# Patient Record
Sex: Male | Born: 1965 | Race: White | Hispanic: No | Marital: Single | Smoking: Former smoker
Health system: Southern US, Community
[De-identification: ages and names within clinical notes are randomized; demographics above are authoritative.]

## PROBLEM LIST (undated history)

## (undated) DIAGNOSIS — I10 Essential (primary) hypertension: Secondary | ICD-10-CM

## (undated) DIAGNOSIS — I2510 Atherosclerotic heart disease of native coronary artery without angina pectoris: Secondary | ICD-10-CM

## (undated) DIAGNOSIS — I2781 Cor pulmonale (chronic): Secondary | ICD-10-CM

## (undated) DIAGNOSIS — I272 Pulmonary hypertension, unspecified: Secondary | ICD-10-CM

## (undated) DIAGNOSIS — E662 Morbid (severe) obesity with alveolar hypoventilation: Secondary | ICD-10-CM

## (undated) DIAGNOSIS — E119 Type 2 diabetes mellitus without complications: Secondary | ICD-10-CM

## (undated) DIAGNOSIS — G4730 Sleep apnea, unspecified: Secondary | ICD-10-CM

## (undated) DIAGNOSIS — J189 Pneumonia, unspecified organism: Secondary | ICD-10-CM

## (undated) HISTORY — DX: Type 2 diabetes mellitus without complications: E11.9

## (undated) HISTORY — DX: Morbid (severe) obesity with alveolar hypoventilation: E66.2

## (undated) HISTORY — DX: Atherosclerotic heart disease of native coronary artery without angina pectoris: I25.10

## (undated) HISTORY — DX: Cor pulmonale (chronic): I27.81

## (undated) HISTORY — DX: Pneumonia, unspecified organism: J18.9

## (undated) HISTORY — DX: Pulmonary hypertension, unspecified: I27.20

## (undated) HISTORY — DX: Essential (primary) hypertension: I10

---

## 2004-09-13 ENCOUNTER — Inpatient Hospital Stay (HOSPITAL_COMMUNITY): Admission: AC | Admit: 2004-09-13 | Discharge: 2004-09-23 | Payer: Self-pay | Admitting: Cardiology

## 2004-09-13 ENCOUNTER — Ambulatory Visit: Payer: Self-pay | Admitting: Cardiology

## 2004-09-20 HISTORY — PX: CORONARY ARTERY BYPASS GRAFT: SHX141

## 2004-10-04 ENCOUNTER — Encounter: Admission: RE | Admit: 2004-10-04 | Discharge: 2005-01-02 | Payer: Self-pay | Admitting: Cardiology

## 2004-10-25 ENCOUNTER — Ambulatory Visit: Payer: Self-pay | Admitting: Cardiology

## 2005-01-06 ENCOUNTER — Ambulatory Visit: Payer: Self-pay | Admitting: Cardiology

## 2005-02-27 ENCOUNTER — Ambulatory Visit: Payer: Self-pay | Admitting: Cardiology

## 2005-02-27 ENCOUNTER — Ambulatory Visit: Payer: Self-pay

## 2005-06-16 ENCOUNTER — Ambulatory Visit: Payer: Self-pay | Admitting: Cardiology

## 2006-01-18 IMAGING — CR DG CHEST 2V
2 series · 2 of 2 positions shown · non-contrast
Comparison: none

CLINICAL DATA: 38-year-old with chest pain; hx of bypass surgery
 TWO VIEW CHEST: 
 Two views of the chest compared to prior portable film from yesterday.  The left sided chest tube, Swan Ganz catheter and mediastinal drain tubes have been removed.  No pneumothorax is seen.  There is perihilar and basilar atelectasis.  No effusions.

[view not recorded (1 of 2)]
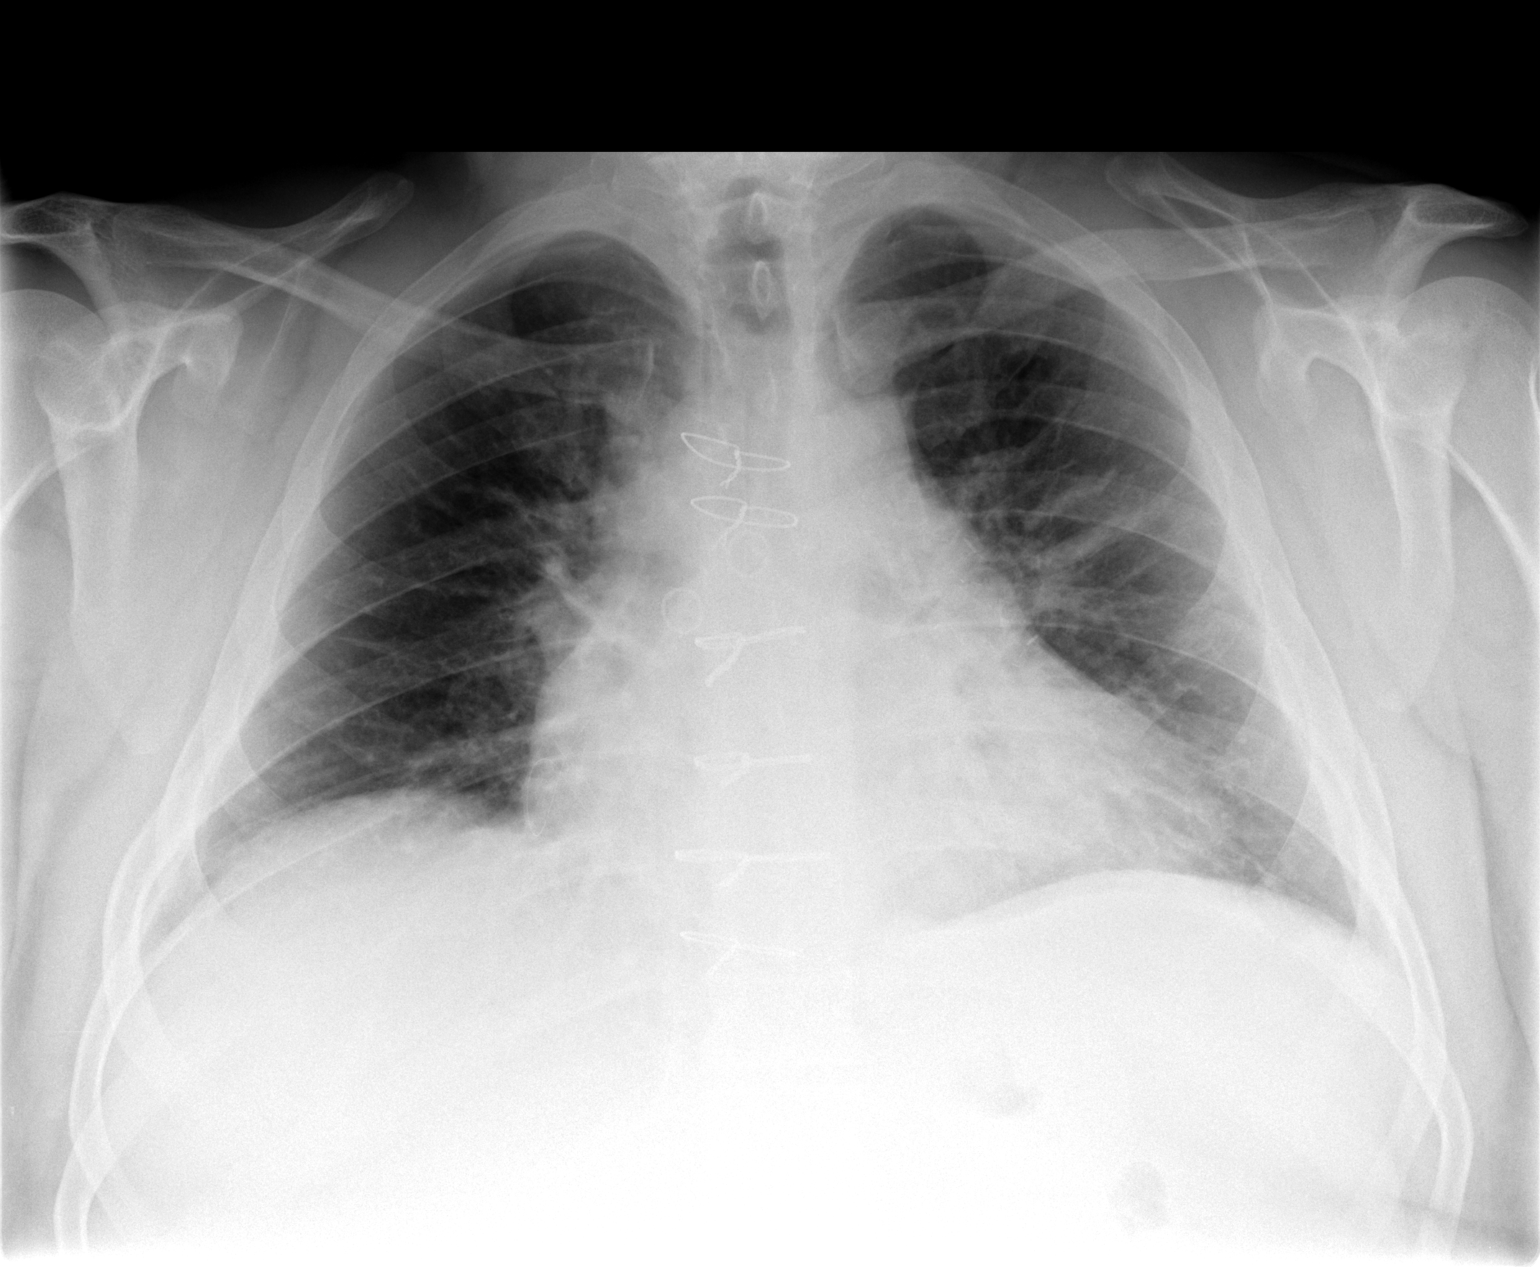

[view not recorded (2 of 2)]
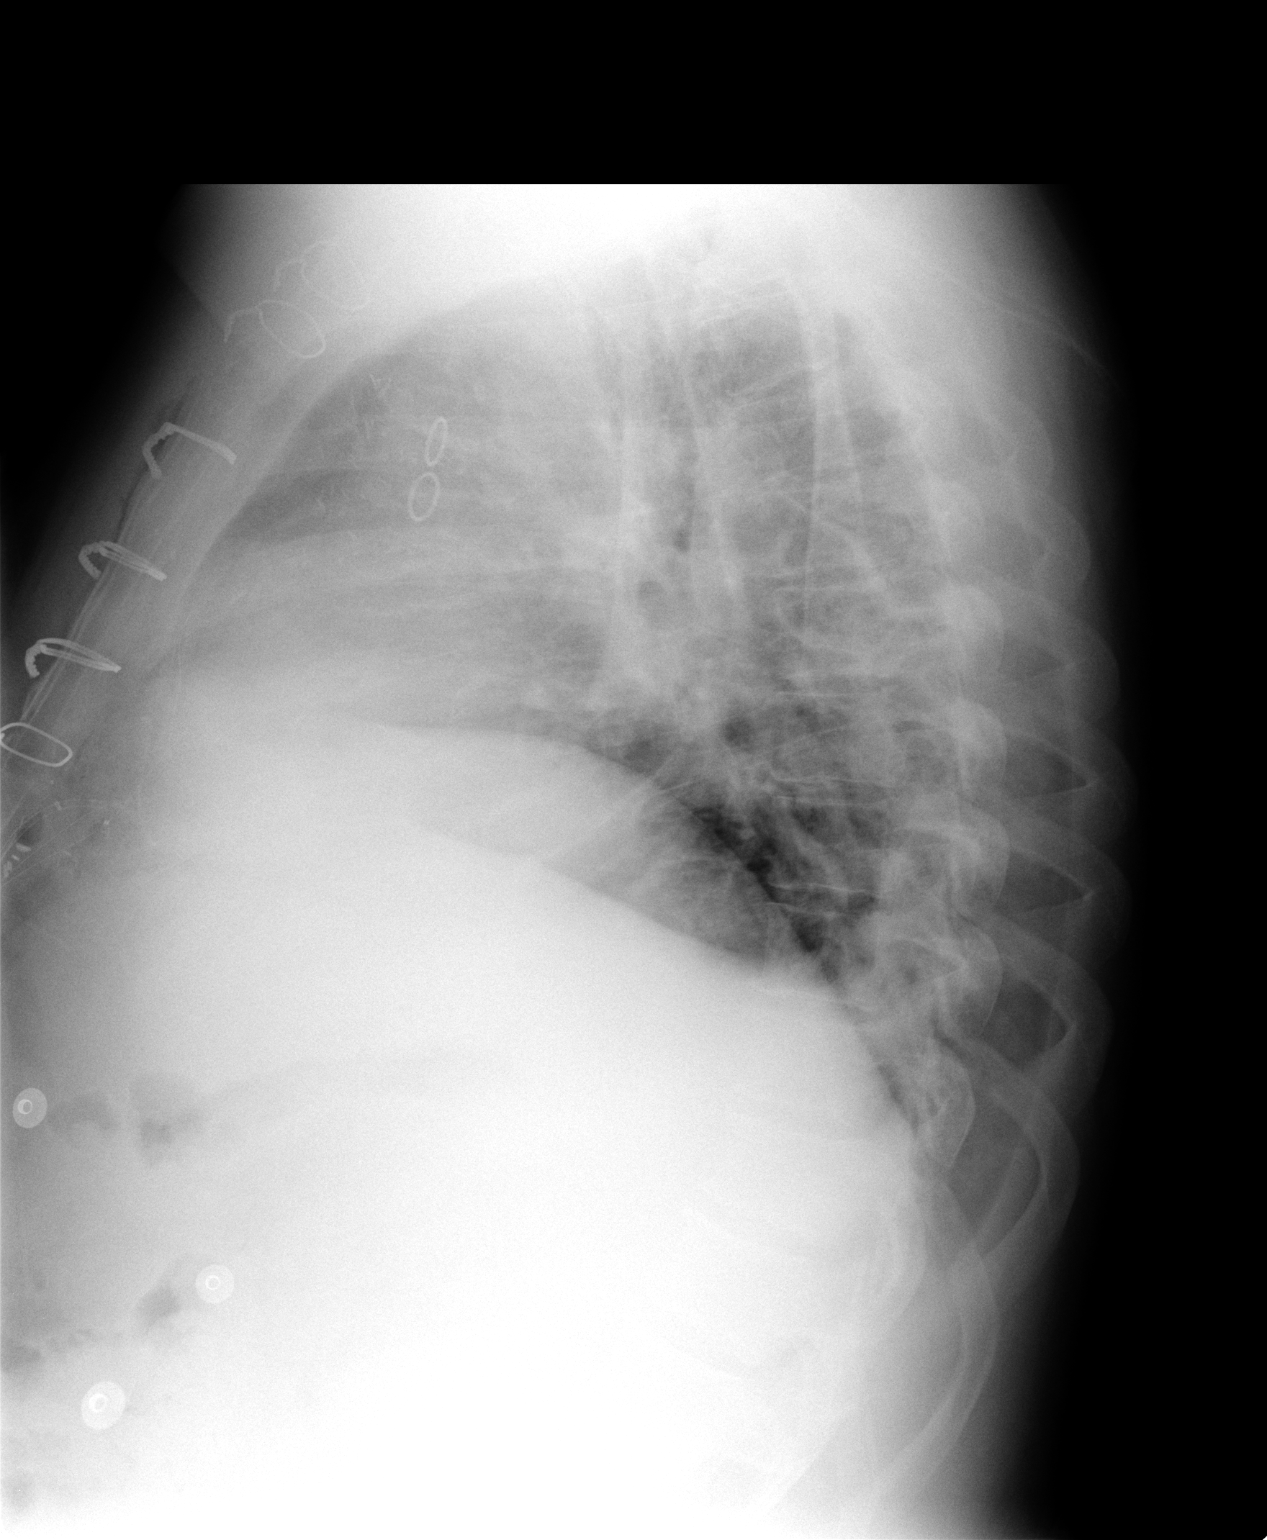

[2 of 2 positions shown; findings below may reference images not displayed]

IMPRESSION: 1.  Removal of the support apparatus. 
 2.  Low lung volumes with perihilar and basilar atelectasis.  No pneumothorax or pulmonary edema.

## 2006-06-18 ENCOUNTER — Ambulatory Visit: Payer: Self-pay | Admitting: Cardiology

## 2007-06-21 ENCOUNTER — Ambulatory Visit: Payer: Self-pay | Admitting: Cardiology

## 2008-06-25 ENCOUNTER — Ambulatory Visit: Payer: Self-pay | Admitting: Cardiology

## 2009-05-19 ENCOUNTER — Encounter: Payer: Self-pay | Admitting: Cardiology

## 2009-06-23 ENCOUNTER — Ambulatory Visit: Payer: Self-pay | Admitting: Cardiology

## 2009-06-23 DIAGNOSIS — I25810 Atherosclerosis of coronary artery bypass graft(s) without angina pectoris: Secondary | ICD-10-CM

## 2009-06-23 DIAGNOSIS — E782 Mixed hyperlipidemia: Secondary | ICD-10-CM | POA: Insufficient documentation

## 2009-06-23 DIAGNOSIS — I10 Essential (primary) hypertension: Secondary | ICD-10-CM | POA: Insufficient documentation

## 2009-06-23 DIAGNOSIS — E119 Type 2 diabetes mellitus without complications: Secondary | ICD-10-CM

## 2009-11-01 ENCOUNTER — Ambulatory Visit: Payer: Self-pay

## 2009-11-01 ENCOUNTER — Ambulatory Visit: Payer: Self-pay | Admitting: Cardiology

## 2009-11-01 ENCOUNTER — Encounter (INDEPENDENT_AMBULATORY_CARE_PROVIDER_SITE_OTHER): Payer: Self-pay | Admitting: *Deleted

## 2009-11-01 ENCOUNTER — Ambulatory Visit (HOSPITAL_COMMUNITY): Admission: RE | Admit: 2009-11-01 | Discharge: 2009-11-01 | Payer: Self-pay | Admitting: Cardiology

## 2009-11-01 ENCOUNTER — Ambulatory Visit: Payer: Self-pay | Admitting: Internal Medicine

## 2009-11-01 ENCOUNTER — Encounter: Payer: Self-pay | Admitting: Cardiology

## 2009-11-22 ENCOUNTER — Encounter: Payer: Self-pay | Admitting: Cardiology

## 2009-11-22 LAB — CONVERTED CEMR LAB
ALT: 27 units/L (ref 0–53)
AST: 25 units/L (ref 0–37)
Albumin: 4.1 g/dL (ref 3.5–5.2)
Alkaline Phosphatase: 70 units/L (ref 39–117)
Bilirubin, Direct: 0.1 mg/dL (ref 0.0–0.3)
Cholesterol: 137 mg/dL (ref 0–200)
HDL: 35.6 mg/dL — ABNORMAL LOW (ref >39.00)
LDL Cholesterol: 68 mg/dL (ref 0–99)
Total Bilirubin: 0.4 mg/dL (ref 0.3–1.2)
Total CHOL/HDL Ratio: 4
Total Protein: 7.5 g/dL (ref 6.0–8.3)
Triglycerides: 166 mg/dL — ABNORMAL HIGH (ref 0.0–149.0)
VLDL: 33.2 mg/dL (ref 0.0–40.0)

## 2010-09-06 NOTE — Miscellaneous (Signed)
Summary: Outpatient Coinsurance Notice  Outpatient Coinsurance Notice   Imported By: Marylou Mccoy 11/10/2009 14:42:52  _____________________________________________________________________  External Attachment:    Type:   Image     Comment:   External Document

## 2010-09-06 NOTE — Letter (Signed)
Summary: Results Follow-up  [facility], Main Office  [address]   [place], [place] [address]   Phone: [phone]  Fax: [phone]     November 22, 2009 MRN: [id number]   Cesar Cantrell [address] [place], [place]  [address]   Dear Mr. Rising,  We have received the results from your recent labwork and echocardiogram and have been unable to contact you.  Please call our office at the number listed above so that Dr. Rosalyn Charters nurse                      may review the results with you.    Thank you,   [facility]

## 2010-10-18 ENCOUNTER — Encounter (INDEPENDENT_AMBULATORY_CARE_PROVIDER_SITE_OTHER): Payer: Self-pay | Admitting: *Deleted

## 2010-10-25 NOTE — Letter (Signed)
Summary: Appointment - Reschedule  [facility], Main Office  [address]   [place], [place] [address]   Phone: [phone]  Fax: [phone]     October 18, 2010 MRN: [id number]   Cesar Cantrell [address] [place], [place]  [address]   Dear Mr. Woehl,   Due to a change in our office schedule, your appointment on  November 03, 2010 at 11am must be changed.  It is very important that we reach you to reschedule this appointment. We look forward to participating in your health care needs. Please contact us at the number listed above at your earliest convenience to reschedule this appointment.     Sincerely,  Glass blower/designer

## 2010-10-26 ENCOUNTER — Encounter: Payer: Self-pay | Admitting: Cardiology

## 2010-10-26 NOTE — Telephone Encounter (Signed)
A user error has taken place: encounter opened in error, closed for administrative reasons.

## 2010-10-26 NOTE — Telephone Encounter (Signed)
Pt needs plavoix

## 2010-11-01 ENCOUNTER — Encounter: Payer: Self-pay | Admitting: Cardiology

## 2010-11-03 ENCOUNTER — Ambulatory Visit (INDEPENDENT_AMBULATORY_CARE_PROVIDER_SITE_OTHER): Payer: Self-pay | Admitting: Cardiology

## 2010-11-03 ENCOUNTER — Encounter: Payer: Self-pay | Admitting: Cardiology

## 2010-11-03 DIAGNOSIS — F17200 Nicotine dependence, unspecified, uncomplicated: Secondary | ICD-10-CM

## 2010-11-03 DIAGNOSIS — Z720 Tobacco use: Secondary | ICD-10-CM

## 2010-11-03 DIAGNOSIS — I10 Essential (primary) hypertension: Secondary | ICD-10-CM

## 2010-11-03 DIAGNOSIS — I25810 Atherosclerosis of coronary artery bypass graft(s) without angina pectoris: Secondary | ICD-10-CM

## 2010-11-03 DIAGNOSIS — E7800 Pure hypercholesterolemia, unspecified: Secondary | ICD-10-CM

## 2010-11-03 NOTE — Patient Instructions (Signed)
Your physician wants you to follow-up in: 2 YEARS  You will receive a reminder letter in the mail two months in advance. If you don't receive a letter, please call our office to schedule the follow-up appointment.   Your physician recommends that you continue on your current medications as directed. Please refer to the Current Medication list given to you today.  

## 2010-11-06 DIAGNOSIS — Z720 Tobacco use: Secondary | ICD-10-CM | POA: Insufficient documentation

## 2010-11-06 NOTE — Progress Notes (Signed)
   Patient ID: Cesar Cantrell, male    DOB: 05/01/1966, 45 y.o.   MRN: [id number]  HPI    Review of Systems    Physical Exam

## 2010-11-06 NOTE — Assessment & Plan Note (Signed)
No recurrent chest pain.  Importance of tobacco cessation, reviewed for 3-10 minutes, was reviewed with the patient.  He is understanding, but has not been able to quit really.  He is doing well from the cardiac side, but his weight, smoking, lack of activity, remain risk factors for recurrent cardiac events.

## 2010-11-06 NOTE — Assessment & Plan Note (Signed)
Repeat by me as noted.  Not well controlled.  It was better when I checked.  Importance of weight loss stressed.

## 2010-11-06 NOTE — Progress Notes (Signed)
HPI:  Cesar Cantrell is in for followup.  He gets his primary care at [facility] Overall he is doing well.  Denies any chest pain at the present time.  He walks a lot on his job, but no separate exercise.  He also smokes at times, when he bums one from another person.  BP recheck by me today in the office was 120/80  Current Outpatient Prescriptions  Medication Sig Dispense Refill  . aspirin 81 MG tablet Take 81 mg by mouth daily.        . carvedilol (COREG) 12.5 MG tablet Take 12.5 mg by mouth 2 (two) times daily with a meal.        . fish oil-omega-3 fatty acids 1000 MG capsule Take 1 capsule by mouth daily.        Marland Kitchen glimepiride (AMARYL) 2 MG tablet 2 mg. Take 2 tablets daily       . lisinopril (PRINIVIL,ZESTRIL) 20 MG tablet Take 20 mg by mouth daily.        . metFORMIN (GLUCOPHAGE) 1000 MG tablet Take 1,000 mg by mouth 2 (two) times daily with a meal.        . simvastatin (ZOCOR) 80 MG tablet Take 80 mg by mouth at bedtime.          No Known Allergies  Past Medical History  Diagnosis Date  . Coronary artery disease   . Hypertension   . Pneumonia   . Pulmonary hypertension   . Diabetes mellitus     type 2    Past Surgical History  Procedure Date  . Coronary artery bypass graft 09-20-04    x5 LIMA to LAD, left radial to D1, SVG to ramus and SVG to PDA/PLA    Family History  Problem Relation Age of Onset  . Heart disease Other 60    grandmother  . Coronary artery disease      No hx of premature heart disease    History   Social History  . Marital Status: Single    Spouse Name: N/A    Number of Children: N/A  . Years of Education: N/A   Occupational History  . Curator    Social History Main Topics  . Smoking status: Current Some Day Smoker    Last Attempt to Quit: 08/07/2004  . Smokeless tobacco: Not on file  . Alcohol Use: No  . Drug Use: No  . Sexually Active: Not on file   Other Topics Concern  . Not on file   Social History Narrative  . No  narrative on file    ROS: Please see the HPI.  All other systems reviewed and negative.  PHYSICAL EXAM:  BP 150/98  Pulse 70  Ht 5\' 6"  (1.676 m)  Wt 293 lb 1.9 oz (132.958 kg)  BMI 47.31 kg/m2  General: Well developed, well nourished, in no acute distress. Head:  Normocephalic and atraumatic. Neck: no JVD Lungs: Clear to auscultation and percussion. Heart: Normal S1 and S2.  No murmur, rubs or gallops.  Abdomen:  Normal bowel sounds; soft; non tender; no organomegaly Pulses: Pulses normal in all 4 extremities. Extremities: No clubbing or cyanosis. No edema. Neurologic: Alert and oriented x 3.  EKG:  NSR.  Inferior MI, age indeterminate.  No acute changes.  ASSESSMENT AND PLAN:

## 2010-11-06 NOTE — Assessment & Plan Note (Signed)
Needs follow up labs done.

## 2010-12-20 NOTE — Assessment & Plan Note (Signed)
[facility]                            CARDIOLOGY OFFICE NOTE   Cesar Cantrell, Cesar Cantrell                       MRN:          [id number]  DATE:06/25/2008                            DOB:          07-25-1966    Cesar Cantrell is in for his annual followup.  Cardiac-wise, he is getting  along well.  He has not really been having any major trouble.  He had  resumed smoking, but quit about 6 months ago.   CURRENT MEDICATIONS:  1. Fish oil 1000 mg daily.  2. Glimepiride 2 mg 2 tablets daily.  3. Metformin hydrochloride 1000 mg daily.  4. Simvastatin 80 mg nightly.  5. Carvedilol 12.5 mg daily.  6. Aspirin 325 mg daily.   PHYSICAL EXAMINATION:  He is alert and oriented in no distress.  The  weight is 303 pounds.  Blood pressure 136/90, the pulse is 60.  The lung  fields are clear.  The cardiac exam reveals an S4 gallop.   Last echocardiogram was done in 2006.   IMPRESSION:  1. Coronary artery disease status post acute inferior wall myocardial      infarction with cardiogenic shock and subsequent coronary artery      bypass graft surgery with internal mammary to the left anterior      descending, left radial to branch of this diagonal, saphenous vein      graft to the ramus, and sequential saphenous vein graft to the      posterior descending and posterolateral arteries.  2. History of tobacco use.  3. Hypertension.  4. Adult onset diabetes mellitus.  5. Hypercholesterolemia.   RECOMMENDATIONS:  1. Encourage efforts to not smoke.  2. The patient I would benefit clearly from a loss of weight with      regard to both diabetes, hypertension, and potentially sleep apnea.  3. Close followup with his primary physician regarding lipid lowering.  4. Recommend continued close followup with Dr. Gerhard Munch.   ADDENDUM:  EKG demonstrates normal sinus rhythm inferior infarct of  indeterminate age compared to November 2008, no significant interval  changes are noted.  We  will see him in followup in 1 year.     Arturo Morton. Riley Kill, MD, [facility]  Electronically Signed    TDS/MedQ  DD: 08/08/2008  DT: 08/08/2008  Job #: [id number]   cc:   Linward Foster

## 2010-12-20 NOTE — Letter (Signed)
June 21, 2007    Linward Foster, MD.  [facility].  [address]  [address]  [place], [place], [address].   RE:  Cesar Cantrell  MRN:  [id number]  /  DOB:  1965/11/04   Dear Dr. Gerhard Munch:   I had the pleasure of seeing your nice patient, Cesar Cantrell, in the  office today in a followup visit.  As you know, this young gentleman  underwent urgent percutaneous intervention and had a drug-eluting stent  placed in the right coronary artery.  He then subsequently underwent  coronary artery bypass graft surgery with an internal mammary to the  LAD, a left radial to the first branch of the diagonal, saphenous vein  graft to the ramus, and sequential saphenous vein grafts to the  posterior descending and posterolateral arteries.  He has clinically  done reasonably well.  He has unfortunately resumed some smoking.  I  noted that he had laboratory studies done in your office.  This was  notable for a slight elevation in calcium at 10.6, the BUN was normal as  was the glucose.  His cholesterol's were remarkable for an LDL of 68.  He clinically has gotten along well.   His current medications include Lipitor 80 mg daily, Plavix 75 mg daily,  Coreg-CR 40 mg daily, aspirin 81 mg daily, Januvia 100 mg daily, fish  oil 1000 mg daily, and Glimepiride 2 mg two tablets daily.   On physical examination, he is an alert and oriented gentleman in no  distress.  The weight is 302 pounds, the blood pressure 150/100, and the  pulse was 69.  Recheck of the blood pressure was 128/90.  The cardiac  rhythm was regular without a significant murmur.   Overall, the patient has been stable.  His electrocardiogram today  reveals normal sinus rhythm with evidence of his age-indeterminate  inferior wall myocardial infarction.  With regard to his overall  clinical status, I think that the most important thing would be for him  to both lose weight and stop smoking.  I think that it is obvious that  he has done well from the lipid management standpoint.  I have had a  thorough discussion with him about both of these issues today.  His  cousin is an operating room nurse at [employer] and is able to  check his blood pressure, and I have asked him to get several of these  and that he should follow up with you.  I also mentioned his calcium to  him and asked him to follow up with you regarding this.   I appreciate the opportunity of sharing in this nice gentleman's care.  We plan to see him back in followup in a year.  He should remain on his  medical regimen, and specifically I would likely recommend that he stay  on Plavix as well given his prior drug-eluting stent placement in the  setting of myocardial infarction.  Thanks for allowing me to share in  his care.    Sincerely,      Arturo Morton. Riley Kill, MD, [facility]  Electronically Signed    TDS/MedQ  DD: 06/21/2007  DT: 06/21/2007  Job #: [id number]

## 2010-12-23 NOTE — Discharge Summary (Signed)
NAMEREEVES, MUSICK                ACCOUNT NO.:  [id number]   MEDICAL RECORD NO.:  [id number]          PATIENT TYPE:  INP   LOCATION:  2029                         FACILITY:  [facility]   PHYSICIAN:  Salvatore Decent. 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   IN-HOSPITAL DIAGNOSIS:  Diabetes mellitus.   SECONDARY DIAGNOSIS:  Hypertension.   IN-HOSPITAL OPERATIONS AND PROCEDURES:  1.  Cardiac catheterization percutaneous intervention with a coated stented      to the mid right coronary artery and placement of intra-aortic balloon      pump.  2.  Coronary artery bypass grafting x 5 with a left internal mammary artery      to left anterior descending, a left radial to the first branch of the      diagonal, saphenous vein graft to the ramus and sequential saphenous      vein graft to the posterior descending and posterolateral arteries.   ALLERGIES:  No known drug allergies.   HISTORY OF PRESENT ILLNESS:  Mr. Worlds is a very pleasant, 45 year old,  white male with a history of hypertension.  He has no prior history of  cardiac disease.  On September 13, 2004, the patient was working on a chair  for his mother.  He developed a sudden onset of substernal chest pressure.  He initially thought this was indigestion.  Within several minutes, this had  become a severe, unrelenting pressure associated with nausea and  diaphoresis.  His mother took him to the emergency room at [facility]  where an EKG showed ST elevation in the inferior leads.  The patient was  transported emergently to [facility] and taken directly to  the catheterization laboratory by Arturo Morton. Riley Kill, M.D., where he  was  found to have three-vessel disease and acute total occlusion of his mid  right coronary artery.  Arturo Morton. Riley Kill, M.D., performed PTCA and stenting  using a coated stent on the mid right coronary with reperfusion.  The  patient had residual three-vessel disease.  His PA pressure was 51/33 with a  mean of 42.  His right atrial pressure was 22.  His cardiac outflow was 5.2.  The patient was hypotensive and an intra-aortic balloon pump was placed and  turned on at 2:1.  The patient was then stabilized and his pressure became  normal.  He was having no further chest discomfort.  We were then consulted.  For details of the patient's past medical history and physical exam, please  see the dictated history and physical.   HOSPITAL COURSE:  On September 20, 2004, Mr. Jacobsen underwent coronary artery  bypass grafting x 5 where he had a left internal mammary artery to the left  anterior descending, a left radial to the first branch of the diagonal,  saphenous vein graft  to the ramus and a sequential saphenous vein graft to  the posterior descending and posterolateral arteries.  The patient did  tolerate this procedure well and returned to the intensive care unit in  stable condition.  Once the patient started coming awake and following  commands, he was extubated.  This occurred several hours after admission to  the ICU.  His postoperative course was pretty much unremarkable.  His chest  tubes were removed on postoperative day #1.  The patient was up and  ambulating well on postoperative day #2.  His appetite was within normal  limits.  Positive post bowel movement.  On room air, O2 saturations were  between 90-94%.  On postoperative day #2, the chest x-ray was within normal  limits.  He was discharged to home on postoperative day #3 in stable  condition.  A followup appointment was made with Dr. Dorris Fetch for October 24, 2004, at 1:15 p.m.  A followup appointment will be made with Dr.  Riley Kill  in two weeks where he will have a chest x-ray.  The patient was informed to  bring that chest x-ray with him to Dr. Sunday Corn appointment.  Mr.  Knotek received instructions on diet, activity level and incisional care.  He was instructed not to drive until released to do so.  No heavy lifting  over 10 pounds.  He is to wash is incisions with soap and water.  The  patient acknowledges understanding.  Mr. Mabey was also instructed on a low-  fat, low-fat diet.  He is to modified his carbohydrates to a medium calorie  diet.  He was also informed to continue using his incentive spirometer and  to ambulate as tolerated.  The patient acknowledges understanding.  He was  also informed that if he develops any fever, any drainage or opening of any  his incisions, he is to contact the office.  He acknowledges understanding.   DISCHARGE MEDICATIONS:  1.  Aspirin 325 mg p.o. daily.  2.  Toprol XL 25 mg p.o. daily.  3.  Lipitor 80 mg p.o. q.h.s.  4.  Glucotrol XL 2.5 mg p.o. q.a.c.  5.  Lasix 40 mg p.o. daily x 1 week.  6.  Kay-Ciel 40 mEq p.o. daily x 1 week.  7.  Imdur 30 mg p.o. daily.  8.  Plavix 75 mg p.o. daily.  9.  Tylox one tablet p.o. q.4h. p.r.n. pain.   We will hold on starting the patient on __________ due the current regimen  controlling blood pressure and heart rate well.      KMD/MEDQ  D:  09/22/2004  T:  09/22/2004  Job:  [id number]

## 2010-12-23 NOTE — Letter (Signed)
June 18, 2006    Cesar Cantrell, M.D.  [address]  [place], [place] [address]   RE:  Cesar Cantrell, Cesar Cantrell  MRN:  [id number]  /  DOB:  02/25/1966   Dear Dr. Willaim Cantrell:   I had the pleasure of seeing Cesar Cantrell in the office today in followup.  As you know, this gentleman underwent multivessel bypass surgery for  multivessel disease.  The patient had coronary bypass grafting x5 with an  internal mammary to the LAD, a left radial to the first branch of the  diagonal, a saphenous vein graft to the ramus, and a sequential saphenous  vein graft to the posterior descending and posterolateral branch of the  right coronary artery.  In general, he has done well.  Unfortunately, his  weight has continued to rise slightly.   CURRENT MEDICATIONS:  1. Aspirin 325 mg daily.  2. Lipitor 80 mg daily.  3. Plavix 75 mg daily.  4. Glipizide ER five daily.  5. Avandia 4 mg daily.  6. Coreg CR 20 mg daily.   PHYSICAL EXAMINATION:  GENERAL:  He is alert and oriented, in no acute  distress.  VITAL SIGNS:  Blood pressure is 138/88, pulse 66.  LUNGS:  The lung fields are clear.  CARDIAC:  The cardiac rhythm is regular.  There are no carotid bruits.   ELECTROCARDIOGRAM:  Normal sinus rhythm with an age indeterminate inferior  infarction.  No acute ST changes are noted.   LABORATORY DATA:  [name] laboratory work was available.  Unfortunately, his hemoglobin A1C is 6.2.  His LDL is approximately 90.   I have made some specific recommendations regarding his medications.  First,  I would recommend that he decrease his aspirin to 81 mg daily to be used in  combination with his Plavix.  While there remains somewhat of a debate  regarding the long-term benefit of Plavix, given his young age and his  multivessel disease, I would be in favor of this.   Secondly, with regard to his diabetic regimen, as you known, Avandia has  been linked with a slight increase in cardiovascular long-term risk.  Whether this  will hold up under further trials is unclear.  Nonetheless,  Actos used in the proactive study was associated with some significant  reduction in overall cardiac event rates and might be an acceptable  alternative.  We will certainly leave this to your judgment.  With regard to  his lipid lowering, his LDL is at 90, and it would be optimal for him to be  at a target of about 70.  This might require the addition of Zetia.  He is  scheduled to see you in January, and will certainly leave these decisions up  to your recommendations.   Finally, his blood pressure is well above the recommended guidelines, but  this is largely being driven by his uncontrolled weight.  I have made a  special pitch to him to reduce his weight appropriately.   Thanks for allowing me to share in his care, and we will see him back in  followup in 1 year.    Sincerely,      Cesar Morton. Riley Kill, MD, [facility]  Electronically Signed    TDS/MedQ  DD: 06/18/2006  DT: 06/18/2006  Job #: [id number]

## 2010-12-23 NOTE — Cardiovascular Report (Signed)
NAMELETCHER, SCHWEIKERT                ACCOUNT NO.:  [id number]   MEDICAL RECORD NO.:  [id number]          PATIENT TYPE:  INP   LOCATION:  2029                         FACILITY:  [facility]   PHYSICIAN:  Arturo Morton. Riley Kill, M.D. [facility] OF BIRTH:  20-Apr-1966   DATE OF PROCEDURE:  09/13/2004  DATE OF DISCHARGE:  09/23/2004                              CARDIAC CATHETERIZATION   INDICATIONS:  The patient is a 45 year old white male with no prior history  of heart disease but hypertension on Micardis with onset of chest pain and  electrocardiographic evidence of ST elevation MI. He was transferred for  emergent care and urgent intervention.   PROCEDURE:  1.  Left and right heart catheterization.  2.  Selective coronary arteriography.  3.  Selective left ventriculography.  4.  PTCA and stenting of the right coronary artery.  5.  Intra-aortic balloon insertion.   DESCRIPTION OF PROCEDURE:  The patient was brought to the catheterization  laboratory and prepped and draped in the usual fashion. Catheterization was  performed through the femoral artery. Views of the left and right coronary  arteries were obtained in several angiographic projections. This  demonstrated total occlusion of the right coronary artery. Ventriculography  was performed in the RAO projection using 24 cc of contrast at a flow rate  of 12 cc per second. The patient was given unfractionated heparin as well as  Integrilin. ACT was checked and was noted to be 259. After the  administration of 6800 units of unfractionated heparin, a 6-French right  coronary guiding catheter with side holes was then intubated into the right  coronary artery. The total occlusion of the right coronary was crossed using  a high-torque floppy wire. The vessel was initially dilated using a 2.5 x 15  Maverick balloon. Following reperfusion, the patient did develop bradycardia  and hypotension and was given 0.6 mm of atropine. The heart rate came up  appropriately. He continued to have some moderate hypotension. The vessel  was then stented using a Cypher 3.5 x 23 stent. This was deployed at 14  atmospheres. A 4.0 Quantum Maverick was then placed in the artery and  balloon dilatation done to post dilate the stent. The patient continued to  have moderate hypotension and because of concern over the possibility of a  right ventricular infarction, we placed a 7-French sheath in the femoral  vein. Swan-Ganz catheter was then passed to the pulmonary artery and  pulmonary wedge position. Blood pressure remained in the 80s despite  successful reperfusion. There was TIMI III flow. Based on this, we elected  to place an intra-aortic balloon pump into the previous sheath. A distal  aortogram with hand injection was done to document adequacy of the aortic,  an intra-aortic balloon pump 40 cm 8-French Datascope device was passed into  the distal aorta. ACT was checked and was 272. With this, the pressure came  up and all catheters were subsequently sewn into place and the patient taken  to the CCU in satisfactory clinical condition.   HEMODYNAMIC DATA:  1.  Right atrium 22.  2.  Right  ventricle 52/24.  3.  Pulmonary artery 51/33.  4.  Pulmonary capillary wedge 34.  5.  Aortic 127/92, mean 108.  6.  Left ventricular pressure 128/33  7.  Thermodilution cardiac output 5.2 liters per minute.  8.  Thermodilution cardiac index 2.12 liters per minute per meter squared.   ANGIOGRAPHIC DATA:  1.  The left main coronary is without critical disease.  2.  The left anterior descending artery courses to the apex. There is      segmental disease in the midportion of the vessel which was graded as 40-      50% luminal reduction. It did not appear to be critical. There is      diffuse luminal changes in the distal LAD compatible with diffuse      disease. There was a major first diagonal branch. This first diagonal      branch has a fair amount of proximal  stenosis that was graded in the      range of around 70%.  3.  The circumflex coronary artery demonstrates severe proximal disease.      There is a large first marginal branch that has diffuse 90% proximal      narrowing. The AV circumflex is severely diseased in the proximal      portion and then totally occluded after the AV circumflex and the distal      marginal fills by collaterals from both the circumflex and right      coronary system.  4.  The right coronary artery is totally occluded in its midportion with 30-      40% proximal disease proximal to the site of total occlusion of the      midvessel. The following reperfusion therapy, the area of plaque rupture      is in the beginning portions of the distal portion of the midvessel.      Following balloon dilatation and subsequent stenting, this area was      reduced to 0% residual luminal narrowing. This provides an acute      marginal branch, a posterior descending branch and then a large      posterolateral branch. This posterolateral branch has 70% narrowing at      its proximal portion. This distal vessel also collateralizes the distal      circumflex system.   VENTRICULOGRAPHY:  Ventriculography was performed in the RAO projection.  There did not appear to be significant mitral regurgitation. The inferobasal  segment was severely hypokinetic. Ejection fraction was measured at 39%,  although visually appears to be slightly greater than this.   CONCLUSION:  1.  Acute inferior wall myocardial infarction secondary to right coronary      artery plaque rupture with successful reperfusion by percutaneous      coronary intervention with adjunctive stenting and glycoprotein      inhibition.  2.  Significant multivessel coronary artery disease.  3.  Moderate reduction in left ventricular function as described in the      above text.   DISPOSITION:  The patient underwent myocardial reperfusion with primary percutaneous  intervention. Post reperfusion was associated with some  moderate hypotension despite restoration of rhythm. Intra-aortic balloon  pump was inserted for hemodynamic stabilization. Right heart catheterization  demonstrated moderate pulmonary hypertension associated with this. There did  not appear to be significant mitral regurgitation by contrast. We will need  surgical consultation to evaluate the patient. He likely need  revascularization surgery once his hemodynamics stabilize. He will  be  transferred to the coronary care unit for further care. this is Arturo Morton.  Stuckey and that is the end of the dictation. Thank you      TDS/MEDQ  D:  10/15/2004  T:  10/16/2004  Job:  [id number]

## 2010-12-23 NOTE — Consult Note (Signed)
NAMECAIDON, FOTI                ACCOUNT NO.:  [id number]   MEDICAL RECORD NO.:  [id number]          PATIENT TYPE:  OIB   LOCATION:  2920                         FACILITY:  [facility]   PHYSICIAN:  Salvatore Decent. Dorris Fetch, M.D.DATE OF BIRTH:  [birth year]   DATE OF CONSULTATION:  09/13/2004  DATE OF DISCHARGE:                                   CONSULTATION   REASON FOR CONSULTATION:  Three vessel coronary disease status post  myocardial infarction. He did complain of chest pain.   HISTORY OF PRESENT ILLNESS:  Mr. Heckard is a 45 year old gentleman with a  history of hypertension. He has no prior history of cardiac disease. Today,  while he was working on a chair for his mother, he developed the sudden  onset of substernal chest pressure. He initially thought this was  indigestion but within several minutes this had become severe unrelenting  pressure associated with nausea and diaphoresis. His mother took him to the  emergency room at [facility] where an EKG showed ST elevations in the  inferior leads. The patient was transported emergently to [facility] and taken directly to the catheterization laboratory by  Dr. Arturo Morton. Stuckey, where he was found to have three vessel disease with  acute total occlusion of his mid-right coronary. Dr. Arturo Morton. Stuckey  performed PTCA and stenting using a coated stent on the mid-right coronary  with reperfusion. The patient has residual three vessel disease. His PA  pressures were 51/33 with a mean of 43. His right atrial pressure was 22.  His cardiac output was 5.2. The patient was hypotensive and intra-aortic  balloon pump was placed and turned on at 2/1. The patient then stabilized,  his pressure became normal and he was having no further chest discomfort.   His left ventriculogram showed inferior hypokinesis but otherwise preserved  ventricular function.   PAST MEDICAL HISTORY:  Hypertension.   MEDICATIONS:  Micardis 80  mg q.d.   ALLERGIES:  He has no known drug allergies.   FAMILY HISTORY:  He did not know his father. His grandmother had heart  disease in her 90s. There is no history of early premature coronary disease.   SOCIAL HISTORY:  He works as a Curator. He smokes about one and a half pack  of cigarettes daily.   REVIEW OF SYMPTOMS:  He had normal state of health prior to today. All other  systems are negative.   PHYSICAL EXAMINATION:  GENERAL:  Mr. Abhimanyu is a 45 year old white male in no  acute distress. He is slightly anxious.  NEUROLOGICAL:  He is alert and oriented x 3. He is appropriate and grossly  intact.  VITAL SIGNS:  Blood pressure is 125/91, pulse is 100, respirations 18. He  weighs 300 pounds. His intra-aortic balloon pump is on at 1/2.  HEENT:  Unremarkable.  LUNGS:  Clear with equal breath sounds bilaterally.  HEART:  Regular rate and rhythm. There is normal S1 and S2. There is an S4.  There is no murmur.  ABDOMEN:  Obese but soft and nontender.  EXTREMITIES:  Well perfused.  LABORATORY DATA:  Sodium 137, potassium 3.6, glucose is 137. BUN and  creatinine 5 and 1.0. Cardiac enzymes are pending. His white count is 12,  hematocrit 45, platelets 447,000. PT and PTT are within normal limits.   His EKG shows ST elevation MI with marked ST elevations in II, III, and aVF  with reciprocal T-wave inversions in aVL and V2.   IMPRESSION:  Mr. Bontrager is a 45 year old gentleman who presents with an  acute ST elevation myocardial infarction with some early cardiogenic shock  with evidence of left and right heart failure. He is now stabilized with an  intra-aortic balloon pump and with percutaneous intervention. He is  reperfused in the infarct area. He currently is stable and pain-free with a  balloon pump at 1/2. I agree with Dr. Arturo Morton. Stuckey that coronary artery  bypass grafting will be indicated for complete revascularization. The  patient would benefit for time for recovery  from stenting given his acute  event including the involvement of the right ventricle. I have discussed  briefly with the patient that we felt coronary artery bypass grafting was  indicated. He was understandably concerned and we will discuss this further  as the patient has had more time to recover and deal with the emotional  impact of this event.      SCH/MEDQ  D:  09/13/2004  T:  09/13/2004  Job:  [id number]   cc:   Arturo Morton. Riley Kill, M.D. [facility], M.D.

## 2010-12-23 NOTE — H&P (Signed)
Cesar Cantrell, Cesar Cantrell                ACCOUNT NO.:  [id number]   MEDICAL RECORD NO.:  [id number]          PATIENT TYPE:  OIB   LOCATION:  2920                         FACILITY:  [facility]   PHYSICIAN:  Tereso Newcomer, P.A.     DATE OF BIRTH:  1966-03-16   DATE OF ADMISSION:  09/13/2004  DATE OF DISCHARGE:                                HISTORY & PHYSICAL   PRIMARY CARE PHYSICIAN:  Dennard Schaumann, MD, in [place].   CHIEF COMPLAINT:  Chest pain.   HISTORY OF PRESENT ILLNESS:  Cesar Cantrell is a very pleasant, 45 year old,  white male with a history of hypertension and no previously known coronary  disease, who presented to [facility] today with the complaints of  acute onset of substernal chest pressure starting at around 12:30 p.m.  today.  He was putting together a chair when this occurred.  He became  diaphoretic.  He denied any associated shortness of breath, syncope, or  presyncope.  He went to [facility] Emergency Room where he was noted  to have 2 to 3 mm of ST elevation in the inferior leads.  He was seen  briefly by our team there and transferred emergently to [facility]  and went directly to the cardiac catheterization lab.  In the  catheterization lab, he was seen by Dr. Riley Kill.  The culprit lesion was in  the RCA, and he was treated with a Cypher stent.  He has three-vessel  coronary disease and will be seen by the Cardiothoracic surgeons for  consideration of CABG.  The patient did have the development of cardiogenic  shock and was placed on an intraaortic balloon pump, which is currently set  at 2:1.   PAST MEDICAL HISTORY:  The patient has a history of hypertension, but denies  any history of diabetes mellitus, hypercholesterolemia, or thyroid disease.  He was hospitalized a year or two ago with pneumonia for about 7 to 10 days.  He denies any other hospitalizations or surgeries.   ALLERGIES:  No known drug allergies.   MEDICATIONS:  Micardis 80 mg  daily.   SOCIAL HISTORY:  The patient smokes cigarettes.  He has smoked for about 20  years at 2-1/2 packs-per-day for a 50-pack-year history.  Denies any alcohol  or drug abuse.  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  He is not married,  but has one teenage son.   FAMILY HISTORY:  His father died at a young age from a motor vehicle  accident.  His mother is alive in her 11s.  There is no known history of  coronary disease other than in a grandmother, who had a myocardial  infarction in her 21s, but died at age 69.   REVIEW OF SYSTEMS:  Please see HPI.  He denies any dyspnea on exertion or  chest discomfort with exertion prior to his myocardial infarction today.  He  denies any melena, hematochezia, hematuria, or dysuria.  Denies fevers,  chills, or cough.  He denies any orthopnea, paroxysmal nocturnal dyspnea, or  lower extremity edema.  The rest of the  review of systems is negative.   PHYSICAL EXAMINATION:  GENERAL:  He is a well-nourished, well-developed male  in no distress.  VITAL SIGNS:  Blood pressure is 110/58.  Temperature is 97.  Pulse is 70.  Oxygen saturation is 97% on 2 liters.  Respirations 20.  HEAD:  Normocephalic, atraumatic.  EYES:  PERRLA, EOMI, sclerae are clear.  THROAT:  Oropharynx reveals no exudate.  NECK:  Without JVD.  LYMPH:  Without lymphadenopathy.  CARDIAC:  Normal S1 and S2.  Regular rate and rhythm.  No murmurs.  LUNGS:  Clear to auscultation bilaterally anteriorly.  ABDOMEN:  Positive bowel sounds.  Soft and nontender.  EXTREMITIES:  Trace edema bilaterally.  VASCULAR:  2+ dorsalis pedis and posterior tibial pulses bilaterally with no  carotid bruits noted bilaterally.  NEUROLOGIC:  Nonfocal.   ELECTROCARDIOGRAM FROM [facility]:  As noted above revealed 2 to 3 mm  of ST elevation in the inferior lead, sinus rhythm, heart rate of 80, T-wave  inversions in V1 and V2   LABORATORY DATA FROM [facility]:  INR 0.9, white count 12,000,   hemoglobin 15.4, hematocrit 44.9, MCV 76.3, platelet count 447,000.  Sodium  135, potassium 3.6, chloride 102, BUN 5, creatinine 1.0, glucose 137.  Cardiac enzymes pending.   IMPRESSION:  1.  Acute inferior ST elevation myocardial infarction associated with      cardiogenic shock.      1.  Status post drug-eluting stent to the right coronary artery.      2.  Three-vessel coronary artery disease.      3.  Intraaortic balloon pump (IABP) support.  2.  Hypertension.  3.  Tobacco abuse.  4.  Obesity.   PLAN:  As noted above, the patient has already been seen in the cath lab by  Dr. Riley Kill and underwent cardiac catheterization and intervention as noted  above.  He will be seen by Cardiac Surgery for consideration of CABG.  Will  check lipid panel and place him on a statin.  He will also be placed on  aspirin and Plavix.      SW/MEDQ  D:  09/13/2004  T:  09/13/2004  Job:  [id number]   cc:   Rocky Link, M.D. [place]

## 2012-10-17 ENCOUNTER — Encounter: Payer: Self-pay | Admitting: Cardiology

## 2012-10-17 ENCOUNTER — Ambulatory Visit (INDEPENDENT_AMBULATORY_CARE_PROVIDER_SITE_OTHER): Payer: Self-pay | Admitting: Cardiology

## 2012-10-17 VITALS — BP 157/86 | HR 74 | Ht 66.0 in | Wt 300.8 lb

## 2012-10-17 DIAGNOSIS — F17200 Nicotine dependence, unspecified, uncomplicated: Secondary | ICD-10-CM

## 2012-10-17 DIAGNOSIS — I10 Essential (primary) hypertension: Secondary | ICD-10-CM

## 2012-10-17 DIAGNOSIS — Z720 Tobacco use: Secondary | ICD-10-CM

## 2012-10-17 DIAGNOSIS — E7800 Pure hypercholesterolemia, unspecified: Secondary | ICD-10-CM

## 2012-10-17 DIAGNOSIS — I2510 Atherosclerotic heart disease of native coronary artery without angina pectoris: Secondary | ICD-10-CM | POA: Insufficient documentation

## 2012-10-17 NOTE — Assessment & Plan Note (Signed)
Continues to smoke a bit.

## 2012-10-17 NOTE — Patient Instructions (Addendum)
Your physician wants you to follow-up in: 6 MONTHS with Dr Diona Browner in the [facility] (previous pt of Dr Riley Kill). You will receive a reminder letter in the mail two months in advance. If you don't receive a letter, please call our office to schedule the follow-up appointment.  Your physician recommends that you continue on your current medications as directed. Please refer to the Current Medication list given to you today.

## 2012-10-17 NOTE — Assessment & Plan Note (Signed)
No current symptoms.  Encouraged not to smoke.

## 2012-10-17 NOTE — Assessment & Plan Note (Signed)
Labs done by his primary MD.

## 2012-10-17 NOTE — Progress Notes (Signed)
   HPI:  This gentleman is in for followup. He continues to remain stable. He lives in [place]. 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 He smokes occasionally, but it does not seem to level is had in the past. He has no ischemic symptoms. His labs are done by his primary care Dr., Dr. Leandrew Koyanagi  Current Outpatient Prescriptions  Medication Sig Dispense Refill  . aspirin 81 MG tablet Take 81 mg by mouth daily.        . carvedilol (COREG) 12.5 MG tablet Take 12.5 mg by mouth 2 (two) times daily with a meal.        . fish oil-omega-3 fatty acids 1000 MG capsule Take 1 capsule by mouth daily.        Marland Kitchen glimepiride (AMARYL) 2 MG tablet 2 mg. Take 2 tablets daily       . lisinopril (PRINIVIL,ZESTRIL) 20 MG tablet Take 20 mg by mouth daily.        . metFORMIN (GLUCOPHAGE) 1000 MG tablet Take 1,000 mg by mouth 2 (two) times daily with a meal.        . simvastatin (ZOCOR) 80 MG tablet Take 80 mg by mouth at bedtime.         No current facility-administered medications for this visit.    No Known Allergies  Past Medical History  Diagnosis Date  . Coronary artery disease   . Hypertension   . Pneumonia   . Pulmonary hypertension   . Diabetes mellitus     type 2    Past Surgical History  Procedure Laterality Date  . Coronary artery bypass graft  09-20-04    x5 LIMA to LAD, left radial to D1, SVG to ramus and SVG to PDA/PLA    Family History  Problem Relation Age of Onset  . Heart disease Other 60    grandmother  . Coronary artery disease      No hx of premature heart disease    History   Social History  . Marital Status: Single    Spouse Name: N/A    Number of Children: N/A  . Years of Education: N/A   Occupational History  . Curator    Social History Main Topics  . Smoking status: Current Some Day Smoker    Last Attempt to Quit: 08/07/2004  . Smokeless tobacco: Not on file  . Alcohol Use: No  . Drug Use: No  . Sexually Active: Not on file   Other Topics  Concern  . Not on file   Social History Narrative  . No narrative on file    ROS: Please see the HPI.  All other systems reviewed and negative.  PHYSICAL EXAM:  BP 157/86  Pulse 74  Ht 5\' 6"  (1.676 m)  Wt 300 lb 12.8 oz (136.442 kg)  BMI 48.57 kg/m2  SpO2 90%  General: Well developed, well nourished, in no acute distress.  Somewhat flushed face.   Head:  Normocephalic and atraumatic. Neck: no JVD Lungs: Clear to auscultation and percussion. Heart: Normal S1 and S2.  No murmur, rubs or gallops.  Abdomen:  Normal bowel sounds; soft; non tender; no organomegaly Pulses: Pulses normal in all 4 extremities. Extremities: No clubbing or cyanosis. No edema. Neurologic: Alert and oriented x 3.  EKG:  NSR.  Inferior MI, indeterminate age.  Nonspecific T flattening.    ASSESSMENT AND PLAN:

## 2012-10-17 NOTE — Assessment & Plan Note (Signed)
Currently mildly elevated.  Cousin checks occassionaly and usually pretty good.

## 2013-01-17 ENCOUNTER — Inpatient Hospital Stay (HOSPITAL_COMMUNITY): Payer: Medicaid - Out of State

## 2013-01-17 ENCOUNTER — Encounter (HOSPITAL_COMMUNITY): Payer: Self-pay | Admitting: *Deleted

## 2013-01-17 ENCOUNTER — Inpatient Hospital Stay: Admit: 2013-01-17 | Payer: Self-pay

## 2013-01-17 ENCOUNTER — Inpatient Hospital Stay (HOSPITAL_COMMUNITY)
Admission: AD | Admit: 2013-01-17 | Discharge: 2013-01-29 | DRG: 286 | Disposition: A | Discharge status: Home / Self Care | Payer: Medicaid - Out of State | Source: Other Acute Inpatient Hospital | Attending: Emergency Medicine | Admitting: Emergency Medicine

## 2013-01-17 ENCOUNTER — Other Ambulatory Visit: Payer: Self-pay | Admitting: Physician Assistant

## 2013-01-17 DIAGNOSIS — R609 Edema, unspecified: Secondary | ICD-10-CM

## 2013-01-17 DIAGNOSIS — I2609 Other pulmonary embolism with acute cor pulmonale: Secondary | ICD-10-CM | POA: Diagnosis present

## 2013-01-17 DIAGNOSIS — I2510 Atherosclerotic heart disease of native coronary artery without angina pectoris: Secondary | ICD-10-CM | POA: Diagnosis present

## 2013-01-17 DIAGNOSIS — Z9861 Coronary angioplasty status: Secondary | ICD-10-CM

## 2013-01-17 DIAGNOSIS — R21 Rash and other nonspecific skin eruption: Secondary | ICD-10-CM | POA: Diagnosis present

## 2013-01-17 DIAGNOSIS — I252 Old myocardial infarction: Secondary | ICD-10-CM

## 2013-01-17 DIAGNOSIS — J9600 Acute respiratory failure, unspecified whether with hypoxia or hypercapnia: Secondary | ICD-10-CM | POA: Diagnosis present

## 2013-01-17 DIAGNOSIS — Z7982 Long term (current) use of aspirin: Secondary | ICD-10-CM

## 2013-01-17 DIAGNOSIS — I2789 Other specified pulmonary heart diseases: Secondary | ICD-10-CM | POA: Diagnosis present

## 2013-01-17 DIAGNOSIS — I10 Essential (primary) hypertension: Secondary | ICD-10-CM

## 2013-01-17 DIAGNOSIS — Z6841 Body Mass Index (BMI) 40.0 and over, adult: Secondary | ICD-10-CM

## 2013-01-17 DIAGNOSIS — E873 Alkalosis: Secondary | ICD-10-CM

## 2013-01-17 DIAGNOSIS — L02419 Cutaneous abscess of limb, unspecified: Secondary | ICD-10-CM | POA: Diagnosis present

## 2013-01-17 DIAGNOSIS — G4733 Obstructive sleep apnea (adult) (pediatric): Secondary | ICD-10-CM | POA: Diagnosis present

## 2013-01-17 DIAGNOSIS — F17200 Nicotine dependence, unspecified, uncomplicated: Secondary | ICD-10-CM | POA: Diagnosis present

## 2013-01-17 DIAGNOSIS — J449 Chronic obstructive pulmonary disease, unspecified: Secondary | ICD-10-CM | POA: Diagnosis present

## 2013-01-17 DIAGNOSIS — E119 Type 2 diabetes mellitus without complications: Secondary | ICD-10-CM

## 2013-01-17 DIAGNOSIS — Z794 Long term (current) use of insulin: Secondary | ICD-10-CM

## 2013-01-17 DIAGNOSIS — Z720 Tobacco use: Secondary | ICD-10-CM

## 2013-01-17 DIAGNOSIS — I5033 Acute on chronic diastolic (congestive) heart failure: Principal | ICD-10-CM | POA: Diagnosis present

## 2013-01-17 DIAGNOSIS — E874 Mixed disorder of acid-base balance: Secondary | ICD-10-CM | POA: Diagnosis present

## 2013-01-17 DIAGNOSIS — I2781 Cor pulmonale (chronic): Secondary | ICD-10-CM

## 2013-01-17 DIAGNOSIS — I509 Heart failure, unspecified: Secondary | ICD-10-CM | POA: Diagnosis present

## 2013-01-17 DIAGNOSIS — E785 Hyperlipidemia, unspecified: Secondary | ICD-10-CM | POA: Diagnosis present

## 2013-01-17 DIAGNOSIS — L0390 Cellulitis, unspecified: Secondary | ICD-10-CM

## 2013-01-17 DIAGNOSIS — Z951 Presence of aortocoronary bypass graft: Secondary | ICD-10-CM

## 2013-01-17 DIAGNOSIS — IMO0002 Reserved for concepts with insufficient information to code with codable children: Secondary | ICD-10-CM

## 2013-01-17 DIAGNOSIS — J4489 Other specified chronic obstructive pulmonary disease: Secondary | ICD-10-CM | POA: Diagnosis present

## 2013-01-17 DIAGNOSIS — I872 Venous insufficiency (chronic) (peripheral): Secondary | ICD-10-CM | POA: Diagnosis present

## 2013-01-17 DIAGNOSIS — N179 Acute kidney failure, unspecified: Secondary | ICD-10-CM | POA: Diagnosis present

## 2013-01-17 DIAGNOSIS — I079 Rheumatic tricuspid valve disease, unspecified: Secondary | ICD-10-CM | POA: Diagnosis present

## 2013-01-17 DIAGNOSIS — E662 Morbid (severe) obesity with alveolar hypoventilation: Secondary | ICD-10-CM | POA: Diagnosis present

## 2013-01-17 LAB — DIFFERENTIAL
Lymphocytes Relative: 9 % — ABNORMAL LOW (ref 12–46)
Lymphs Abs: 1 10*3/uL (ref 0.7–4.0)
Monocytes Relative: 9 % (ref 3–12)
Neutro Abs: 9 10*3/uL — ABNORMAL HIGH (ref 1.7–7.7)
Neutrophils Relative %: 82 % — ABNORMAL HIGH (ref 43–77)

## 2013-01-17 LAB — POCT I-STAT 3, ART BLOOD GAS (G3+)
Bicarbonate: 38.8 mEq/L — ABNORMAL HIGH (ref 20.0–24.0)
pCO2 arterial: 79.5 mmHg (ref 35.0–45.0)
pH, Arterial: 7.296 — ABNORMAL LOW (ref 7.350–7.450)
pO2, Arterial: 57 mmHg — ABNORMAL LOW (ref 80.0–100.0)

## 2013-01-17 LAB — COMPREHENSIVE METABOLIC PANEL
AST: 10 U/L (ref 0–37)
CO2: 36 mEq/L — ABNORMAL HIGH (ref 19–32)
Calcium: 8.2 mg/dL — ABNORMAL LOW (ref 8.4–10.5)
Creatinine, Ser: 0.88 mg/dL (ref 0.50–1.35)
GFR calc Af Amer: >90 mL/min (ref >90)
GFR calc non Af Amer: >90 mL/min (ref >90)
Glucose, Bld: 144 mg/dL — ABNORMAL HIGH (ref 70–99)
Total Protein: 6.1 g/dL (ref 6.0–8.3)

## 2013-01-17 LAB — CBC
HCT: 44.5 % (ref 39.0–52.0)
Hemoglobin: 14.5 g/dL (ref 13.0–17.0)
RBC: 5.46 MIL/uL (ref 4.22–5.81)

## 2013-01-17 LAB — STREP PNEUMONIAE URINARY ANTIGEN: Strep Pneumo Urinary Antigen: NEGATIVE

## 2013-01-17 LAB — GLUCOSE, CAPILLARY: Glucose-Capillary: 148 mg/dL — ABNORMAL HIGH (ref 70–99)

## 2013-01-17 LAB — PRO B NATRIURETIC PEPTIDE: Pro B Natriuretic peptide (BNP): 767 pg/mL — ABNORMAL HIGH (ref 0–125)

## 2013-01-17 MED ORDER — LISINOPRIL 2.5 MG PO TABS: 5.0000 mg | ORAL_TABLET | Freq: Every day | ORAL | Status: DC

## 2013-01-17 MED ORDER — FENTANYL CITRATE 0.05 MG/ML IJ SOLN
50.0000 ug | INTRAMUSCULAR | Status: DC | PRN
  Administered 2013-01-17 (×2): 100 ug via INTRAVENOUS
  Filled 2013-01-17 (×2): qty 2

## 2013-01-17 MED ORDER — ENOXAPARIN SODIUM 150 MG/ML ~~LOC~~ SOLN: 40.0000 mg | SUBCUTANEOUS | Status: DC

## 2013-01-17 MED ORDER — VANCOMYCIN HCL 10 G IV SOLR
1250.0000 mg | Freq: Two times a day (BID) | INTRAVENOUS | Status: DC
  Administered 2013-01-18 – 2013-01-19 (×3): 1250 mg via INTRAVENOUS
  Filled 2013-01-17 (×4): qty 1250

## 2013-01-17 MED ORDER — FUROSEMIDE 10 MG/ML IJ SOLN
20.0000 mg | Freq: Two times a day (BID) | INTRAMUSCULAR | Status: DC
  Administered 2013-01-17: 20 mg via INTRAVENOUS
  Filled 2013-01-17 (×5): qty 2

## 2013-01-17 MED ORDER — ASPIRIN EC 81 MG PO TBEC
81.0000 mg | DELAYED_RELEASE_TABLET | Freq: Every day | ORAL | Status: DC
  Administered 2013-01-19 – 2013-01-29 (×11): 81 mg via ORAL
  Filled 2013-01-17 (×13): qty 1

## 2013-01-17 MED ORDER — SODIUM CHLORIDE 0.9 % IV SOLN: 250.0000 mL | INTRAVENOUS | Status: DC | PRN

## 2013-01-17 MED ORDER — FUROSEMIDE 10 MG/ML IJ SOLN: 40.0000 mg | Freq: Two times a day (BID) | INTRAMUSCULAR | Status: DC

## 2013-01-17 MED ORDER — MIDAZOLAM HCL 2 MG/2ML IJ SOLN
2.0000 mg | INTRAMUSCULAR | Status: DC | PRN
  Administered 2013-01-18: 4 mg via INTRAVENOUS
  Filled 2013-01-17: qty 4

## 2013-01-17 MED ORDER — VANCOMYCIN HCL 10 G IV SOLR
2500.0000 mg | Freq: Once | INTRAVENOUS | Status: AC
  Administered 2013-01-17: 2500 mg via INTRAVENOUS
  Filled 2013-01-17: qty 2500

## 2013-01-17 MED ORDER — INSULIN ASPART 100 UNIT/ML ~~LOC~~ SOLN
0.0000 [IU] | SUBCUTANEOUS | Status: DC
  Administered 2013-01-17: 3 [IU] via SUBCUTANEOUS
  Administered 2013-01-18: 4 [IU] via SUBCUTANEOUS
  Administered 2013-01-18: 11 [IU] via SUBCUTANEOUS
  Administered 2013-01-18 (×2): 3 [IU] via SUBCUTANEOUS

## 2013-01-17 MED ORDER — HEPARIN SODIUM (PORCINE) 5000 UNIT/ML IJ SOLN
5000.0000 [IU] | Freq: Three times a day (TID) | INTRAMUSCULAR | Status: DC
  Administered 2013-01-17 – 2013-01-29 (×35): 5000 [IU] via SUBCUTANEOUS
  Filled 2013-01-17 (×39): qty 1

## 2013-01-17 MED ORDER — LEVOFLOXACIN IN D5W 500 MG/100ML IV SOLN
500.0000 mg | INTRAVENOUS | Status: DC
  Administered 2013-01-17 – 2013-01-18 (×2): 500 mg via INTRAVENOUS
  Filled 2013-01-17 (×3): qty 100

## 2013-01-17 MED ORDER — PIPERACILLIN-TAZOBACTAM 3.375 G IVPB: 3.3750 g | Freq: Four times a day (QID) | INTRAVENOUS | Status: DC

## 2013-01-17 MED ORDER — ASPIRIN EC 81 MG PO TBEC: 81.0000 mg | DELAYED_RELEASE_TABLET | Freq: Every day | ORAL | Status: DC

## 2013-01-17 MED ORDER — ACETAMINOPHEN 325 MG PO TABS: 650.0000 mg | ORAL_TABLET | ORAL | Status: DC | PRN

## 2013-01-17 MED ORDER — ASPIRIN 81 MG PO TABS: 81.0000 mg | ORAL_TABLET | Freq: Every day | ORAL | Status: DC

## 2013-01-17 MED ORDER — POTASSIUM CHLORIDE CRYS ER 10 MEQ PO TBCR: 20.0000 meq | EXTENDED_RELEASE_TABLET | Freq: Two times a day (BID) | ORAL | Status: DC

## 2013-01-17 MED ORDER — CHLORHEXIDINE GLUCONATE 0.12 % MT SOLN
OROMUCOSAL | Status: AC
  Administered 2013-01-17: 15 mL via OROMUCOSAL
  Filled 2013-01-17: qty 15

## 2013-01-17 MED ORDER — BIOTENE DRY MOUTH MT LIQD
15.0000 mL | Freq: Four times a day (QID) | OROMUCOSAL | Status: DC
  Administered 2013-01-18 (×4): 15 mL via OROMUCOSAL

## 2013-01-17 MED ORDER — CHLORHEXIDINE GLUCONATE 0.12 % MT SOLN
15.0000 mL | Freq: Two times a day (BID) | OROMUCOSAL | Status: DC
  Administered 2013-01-18 – 2013-01-19 (×2): 15 mL via OROMUCOSAL
  Filled 2013-01-17 (×2): qty 15

## 2013-01-17 MED ORDER — SIMVASTATIN 5 MG PO TABS: 20.0000 mg | ORAL_TABLET | Freq: Every day | ORAL | Status: DC

## 2013-01-17 MED ORDER — SODIUM CHLORIDE 0.9 % IJ SOLN: 3.0000 mL | Freq: Two times a day (BID) | INTRAMUSCULAR | Status: DC

## 2013-01-17 MED ORDER — PANTOPRAZOLE SODIUM 40 MG IV SOLR
40.0000 mg | INTRAVENOUS | Status: DC
  Administered 2013-01-17: 40 mg via INTRAVENOUS
  Filled 2013-01-17 (×2): qty 40

## 2013-01-17 MED ORDER — SODIUM CHLORIDE 0.9 % IV SOLN: INTRAVENOUS | Status: DC

## 2013-01-17 MED ORDER — SODIUM CHLORIDE 0.9 % IV SOLN: 4.0000 mg | Freq: Four times a day (QID) | INTRAVENOUS | Status: DC | PRN

## 2013-01-17 MED ORDER — SODIUM CHLORIDE 0.9 % IJ SOLN: 3.0000 mL | INTRAMUSCULAR | Status: DC | PRN

## 2013-01-17 MED ORDER — FUROSEMIDE 10 MG/ML IJ SOLN
INTRAMUSCULAR | Status: AC
  Administered 2013-01-17: 20 mg via INTRAVENOUS
  Filled 2013-01-17: qty 4

## 2013-01-17 NOTE — Progress Notes (Addendum)
ANTIBIOTIC CONSULT NOTE - INITIAL  Pharmacy Consult for Vancomycin Indication: LE cellulitis / r/o pneumonia  No Known Allergies  Patient Measurements: Height: 5\' 8"  (172.7 cm) IBW/kg (Calculated) : 68.4 Total BW 134kg  Vital Signs: Temp: 99.3 F (37.4 C) (06/13 1330) Temp src: Oral (06/13 1330) BP: 111/54 mmHg (06/13 1330) Intake/Output from previous day:   Intake/Output from this shift: Total I/O In: 50 [I.V.:50] Out: -   Labs: No results found for this basename: WBC, HGB, PLT, LABCREA, CREATININE,  in the last 72 hours CrCl is unknown because no creatinine reading has been taken. No results found for this basename: VANCOTROUGH, VANCOPEAK, VANCORANDOM, GENTTROUGH, GENTPEAK, GENTRANDOM, TOBRATROUGH, TOBRAPEAK, TOBRARND, AMIKACINPEAK, AMIKACINTROU, AMIKACIN,  in the last 72 hours   Microbiology: No results found for this or any previous visit (from the past 720 hour(s)).  Medical History: Past Medical History  Diagnosis Date  . Coronary artery disease   . Hypertension   . Pneumonia   . Pulmonary hypertension   . Diabetes mellitus     type 2     Assessment: 47 y.o. Obese male, smoker with a history of inferior MI, CABG x5 , type II DM, and hypertension was found unresponsive and brought to [facility] ED.  He was transferred to [facility].   At [facility] he received 1 dose vancomycin 1500mg  6/12 for cellulitis.  ABX changed to cefazolin 6/12.  Cellulitis concern for MRSA and Levofloxacin started for possible CAP.  Baseline lab results not available yet. I will give 1 dose vancomycin and f/u labs.   Goal of Therapy:  Vancomycin trough level 15-20 mcg/ml  Plan:  Vancomycin 2500 mg IV x1  Reassess and determine maintenance dose after baseline labs back   [employer].D. CPP, BCPS Clinical Pharmacist [phone] 01/17/2013 3:31 PM    Addendum: SCr 0.88 WBC 11 H/H 14.5/44.5 Platelets 213  Renal function is normal. Load given already.  Begin vancomycin 1250 mg IV q12h - next  dose 6/14 05:00 Monitor renal function and clinical progress. Follow-up weight and adjust dosing as needed.  [place], [address].D., BCPS Clinical Pharmacist Pager: [phone] 01/17/2013 5:37 PM

## 2013-01-17 NOTE — Progress Notes (Signed)
eLink Physician-Brief Progress Note Patient Name: Cesar Cantrell DOB: 01-Jun-1966 MRN: [id number]  Date of Service  01/17/2013   HPI/Events of Note    Recent Labs Lab 01/17/13 1531  PHART 7.296*  PCO2ART 79.5*  PO2ART 57.0*  HCO3 38.8*  TCO2 41  O2SAT 85.0     eICU Interventions  Leave at current vent settings - prvc, fio2 40%, peep 5, RR 18, Vt 500   Intervention Category Major Interventions: Respiratory failure - evaluation and management  Lasonia Casino 01/17/2013, 4:06 PM

## 2013-01-17 NOTE — Progress Notes (Signed)
SUBJECTIVE:  The patient was seen earlier in the day by Dr. Diona Browner.  He is transferred with respiratory failure and is s/p intubation at [facility].  He has had increased edema and was admitted for this but had decompensation this AM.  Echo showed normal EF.  There is a vague aortic valve lesion.  He is awake and alert   PHYSICAL EXAM Filed Vitals:   01/17/13 1330 01/17/13 1600 01/17/13 1900 01/17/13 2000  BP: 111/54  96/53 97/48  Pulse: 80   75  Temp: 99.3 F (37.4 C) 99 F (37.2 C)  99.2 F (37.3 C)  TempSrc: Oral Oral  Oral  Resp: 16   18  Height: 5\' 9"  (1.753 m)     Weight: 311 lb 11.7 oz (141.4 kg)     SpO2: 93%   95%   General:  No acute distress Lungs:  Decreased breath sounds Heart:  RRR Abdomen:  Positive bowel sounds, no rebound no guarding Extremities:  Severe edema (anasarca)  LABS: No results found for this basename: CKTOTAL, CKMB, CKMBINDEX, TROPONINI   Results for orders placed during the hospital encounter of 01/17/13 (from the past 24 hour(s))  GLUCOSE, CAPILLARY     Status: Abnormal   Collection Time    01/17/13  2:02 PM      Result Value Range   Glucose-Capillary 155 (*) 70 - 99 mg/dL  MRSA PCR SCREENING     Status: None   Collection Time    01/17/13  2:37 PM      Result Value Range   MRSA by PCR NEGATIVE  NEGATIVE  POCT I-STAT 3, BLOOD GAS (G3+)     Status: Abnormal   Collection Time    01/17/13  3:31 PM      Result Value Range   pH, Arterial 7.296 (*) 7.350 - 7.450   pCO2 arterial 79.5 (*) 35.0 - 45.0 mmHg   pO2, Arterial 57.0 (*) 80.0 - 100.0 mmHg   Bicarbonate 38.8 (*) 20.0 - 24.0 mEq/L   TCO2 41  0 - 100 mmol/L   O2 Saturation 85.0     Acid-Base Excess 8.0 (*) 0.0 - 2.0 mmol/L   Patient temperature 98.3 F     Collection site RADIAL, ALLEN'S TEST ACCEPTABLE     Drawn by RT     Sample type ARTERIAL     Comment NOTIFIED PHYSICIAN    STREP PNEUMONIAE URINARY ANTIGEN     Status: None   Collection Time    01/17/13  3:34 PM      Result  Value Range   Strep Pneumo Urinary Antigen NEGATIVE  NEGATIVE  PRO B NATRIURETIC PEPTIDE     Status: Abnormal   Collection Time    01/17/13  3:55 PM      Result Value Range   Pro B Natriuretic peptide (BNP) 767.0 (*) 0 - 125 pg/mL  PROCALCITONIN     Status: None   Collection Time    01/17/13  3:55 PM      Result Value Range   Procalcitonin <0.10    GLUCOSE, CAPILLARY     Status: Abnormal   Collection Time    01/17/13  4:04 PM      Result Value Range   Glucose-Capillary 148 (*) 70 - 99 mg/dL  COMPREHENSIVE METABOLIC PANEL     Status: Abnormal   Collection Time    01/17/13  4:15 PM      Result Value Range   Sodium 138  135 - 145  mEq/L   Potassium 4.4  3.5 - 5.1 mEq/L   Chloride 96  96 - 112 mEq/L   CO2 36 (*) 19 - 32 mEq/L   Glucose, Bld 144 (*) 70 - 99 mg/dL   BUN 7  6 - 23 mg/dL   Creatinine, Ser 9.81  0.50 - 1.35 mg/dL   Calcium 8.2 (*) 8.4 - 10.5 mg/dL   Total Protein 6.1  6.0 - 8.3 g/dL   Albumin 2.8 (*) 3.5 - 5.2 g/dL   AST 10  0 - 37 U/L   ALT 12  0 - 53 U/L   Alkaline Phosphatase 91  39 - 117 U/L   Total Bilirubin 0.3  0.3 - 1.2 mg/dL   GFR calc non Af Amer >90  >90 mL/min   GFR calc Af Amer >90  >90 mL/min  CBC     Status: Abnormal   Collection Time    01/17/13  4:15 PM      Result Value Range   WBC 11.0 (*) 4.0 - 10.5 K/uL   RBC 5.46  4.22 - 5.81 MIL/uL   Hemoglobin 14.5  13.0 - 17.0 g/dL   HCT 19.1  47.8 - 29.5 %   MCV 81.5  78.0 - 100.0 fL   MCH 26.6  26.0 - 34.0 pg   MCHC 32.6  30.0 - 36.0 g/dL   RDW 62.1  30.8 - 65.7 %   Platelets 213  150 - 400 K/uL  DIFFERENTIAL     Status: Abnormal   Collection Time    01/17/13  4:15 PM      Result Value Range   Neutrophils Relative % 82 (*) 43 - 77 %   Neutro Abs 9.0 (*) 1.7 - 7.7 K/uL   Lymphocytes Relative 9 (*) 12 - 46 %   Lymphs Abs 1.0  0.7 - 4.0 K/uL   Monocytes Relative 9  3 - 12 %   Monocytes Absolute 1.0  0.1 - 1.0 K/uL   Eosinophils Relative 1  0 - 5 %   Eosinophils Absolute 0.1  0.0 - 0.7 K/uL    Basophils Relative 0  0 - 1 %   Basophils Absolute 0.0  0.0 - 0.1 K/uL    Intake/Output Summary (Last 24 hours) at 01/17/13 2010 Last data filed at 01/17/13 2000  Gross per 24 hour  Intake    950 ml  Output    120 ml  Net    830 ml     ASSESSMENT AND PLAN:  Acute respiratory failure:  Felt to be secondary to acute on chronic diastolic HF.  However, the pro BNP is not particularly elevated but the CXR here has evidence of edema.   Aspiration pneumonia is possible.  CCM is consulted for vent management.  Antibiotics have been started per CCM.  BP is low so beta blockers and lisinopril have been held.  I will start with low dose IV Lasix given his low BP.      Rollene Rotunda 01/17/2013 8:10 PM

## 2013-01-17 NOTE — Consult Note (Signed)
PULMONARY  / CRITICAL CARE MEDICINE  Name: Cesar Cantrell MRN: [id number] DOB: 1965-12-18    ADMISSION DATE:  01/17/2013 CONSULTATION DATE:  01/17/13  REFERRING MD :  Dr. Riley Kill PRIMARY SERVICE:  Cardiology  CHIEF COMPLAINT:  Dyspnea       BRIEF PATIENT DESCRIPTION:  47 y.o. Obese white male, smoker with a history of inferior MI, 5 vessel CABG, type II DM, and hypertension presents from [facility] where he was found unresponsive.  PCCM asked to consult on ventilatory support and pulmonary aspects of case.         SIGNIFICANT EVENTS / STUDIES:  6/13>>>Hypoxic event 6/13>>>Echo   LINES / TUBES: 6/13 Nasal ETT>>>>  CULTURES: 6/13 Blood>>>>> 6/13 Sputum>>> 6/13 Strep Antigen >>>>  ANTIBIOTICS: 6/13 Vanc >>> 6/13 Levaquin >>>   HISTORY OF PRESENT ILLNESS:  47 y.o. Obese white male presents to the PCCM team after being found unresponsive in [facility].  Patient's family reports that the patient was at the beach and had worsening rash located over the legs.  Patient also reported increasing shortness of breath and intermittent cough.  Patient's family denies fever.    Patient was seen by Dr. Diona Browner who admitted the patient to [facility] where he was being treated for cellulitis.  Son reports that father may be having syncopal episodes over the past several weeks.  Patient's past medical history is significant for inferior MI 2006, 5 vessel CABG, type II DM, hypertension, and dyslipidemia.  Family reports compliance with medications and fairly good control of diabetes.  Patient is reported to smoke 2.5 ppd since he was a teenager with a short holiday after his heart attack.  Alcohol and tobacco use denied by family.  Patient repairs engines and works with machinery.      PAST MEDICAL HISTORY :  Past Medical History  Diagnosis Date  . Coronary artery disease   . Hypertension   . Pneumonia   . Pulmonary hypertension   . Diabetes mellitus     type 2   Past  Surgical History  Procedure Laterality Date  . Coronary artery bypass graft  09-20-04    x5 LIMA to LAD, left radial to D1, SVG to ramus and SVG to PDA/PLA   Prior to Admission medications   Medication Sig Start Date End Date Taking? Authorizing Provider  aspirin 81 MG tablet Take 81 mg by mouth daily.      Historical Provider, MD  carvedilol (COREG) 12.5 MG tablet Take 12.5 mg by mouth 2 (two) times daily with a meal.      Historical Provider, MD  fish oil-omega-3 fatty acids 1000 MG capsule Take 1 capsule by mouth daily.      Historical Provider, MD  glimepiride (AMARYL) 2 MG tablet 2 mg. Take 2 tablets daily     Historical Provider, MD  lisinopril (PRINIVIL,ZESTRIL) 20 MG tablet Take 20 mg by mouth daily.      Historical Provider, MD  metFORMIN (GLUCOPHAGE) 1000 MG tablet Take 1,000 mg by mouth 2 (two) times daily with a meal.      Historical Provider, MD  simvastatin (ZOCOR) 80 MG tablet Take 80 mg by mouth at bedtime.      Historical Provider, MD   No Known Allergies  FAMILY HISTORY:  Family History  Problem Relation Age of Onset  . Heart disease Other 60    grandmother  . Coronary artery disease      No hx of premature heart disease   SOCIAL HISTORY:  reports that he has been smoking.  He does not have any smokeless tobacco history on file. He reports that he does not drink alcohol or use illicit drugs.  REVIEW OF SYSTEMS:   Constitutional: Negative for fever HENT: Negative for hearing loss, ear pain, nosebleeds, congestion, sore throat, neck pain, tinnitus and ear discharge.   Eyes: Negative for blurred vision, double vision, photophobia, pain, discharge and redness.  Respiratory: Negative for, hemoptysis, sputum production, wheezing and stridor. Positive for cough, shortness of breath, Dyspnea on exertion, Syncopal episodes  Cardiovascular: Negative for chest pain, palpitations, orthopnea, claudication,  and PND. Positive for Leg swelling Gastrointestinal: Negative for  heartburn, nausea, vomiting, abdominal pain, diarrhea, constipation, blood in stool and melena.  Genitourinary: Negative for dysuria, urgency, frequency, hematuria and flank pain.  Musculoskeletal: Negative for myalgias, back pain, joint pain and falls.  Skin: Positive Rash.  Neurological: Negative for dizziness, tingling, tremors, sensory change, speech change, focal weakness, seizures, loss of consciousness, weakness and headaches.  Endo/Heme/Allergies: Negative for environmental allergies and polydipsia. Does not bruise/bleed easily.  SUBJECTIVE:   VITAL SIGNS: Temp:  [99 F (37.2 C)-99.3 F (37.4 C)] 99 F (37.2 C) (06/13 1600) Pulse Rate:  [80] 80 (06/13 1330) Resp:  [16] 16 (06/13 1330) BP: (111)/(54) 111/54 mmHg (06/13 1330) SpO2:  [93 %] 93 % (06/13 1330) FiO2 (%):  [40 %-60 %] 40 % (06/13 1600)  PHYSICAL EXAMINATION: General:  Obese white male Neuro:  Alert and oriented. CN II-XII focally intact on gross examination.  Able to follow commands.   HEENT:  NCAT, PERLA, EOM intact, nasally intubated, MM moist, neck supple, no palpable cervical adenopathy. Cardiovascular:  S1S2, heart sounds distant, no appreciable M/R/Gs, pretibial edema of BLE Lungs:  Clubbing of the fingers noted on observation, resonant to percussion, intermittent soft rhonchi, diminished breath sounds at bases.     Abdomen:  +BS in all quadrants, tympanetic to percussion, soft and non-tender to palpation. Musculoskeletal:  Appropriate ROM noted in all limbs with movement on command.  No noted deformities.   Skin:  Generalized macular erythematous rash on bilateral extremities extending to the waste line with ocasional pustule.  Rash blanches.  No evidence of petechiae or purpura.      No results found for this basename: NA, K, CL, CO2, BUN, CREATININE, GLUCOSE,  in the last 168 hours No results found for this basename: HGB, HCT, WBC, PLT,  in the last 168 hours Dg Chest Port 1 View  01/17/2013   *RADIOLOGY  REPORT*  Clinical Data: Left lower lobe infiltrate.  Intubation.  PORTABLE CHEST - 1 VIEW  Comparison: One-view chest 01/17/2013.  Findings: The patient is now intubated.  Endotracheal tube terminates 4.5 cm above the carina, at the level of the clavicles. Cardiac enlargement is stable.  Moderate pulmonary vascular congestion has increased slightly.  Bibasilar airspace disease is evident, worse on the left.  A small left pleural effusion is suspected.  IMPRESSION:  1.  Satisfactory positioning of the endotracheal tube. 2.  Stable cardiomegaly with increasing pulmonary vascular congestion, concerning for congestive heart failure. 3.  Bibasilar airspace disease likely reflects atelectasis, infection is not excluded. 4.  Probable small left pleural effusion.   Original Report Authenticated By: Marin Roberts, M.D.   ABG    Component Value Date/Time   PHART 7.296* 01/17/2013 1531   PCO2ART 79.5* 01/17/2013 1531   PO2ART 57.0* 01/17/2013 1531   HCO3 38.8* 01/17/2013 1531   TCO2 41 01/17/2013 1531   O2SAT 85.0 01/17/2013 1531  Imaging: Dg Chest Port 1 View  01/17/2013   *RADIOLOGY REPORT*  Clinical Data: Left lower lobe infiltrate.  Intubation.  PORTABLE CHEST - 1 VIEW  Comparison: One-view chest 01/17/2013.  Findings: The patient is now intubated.  Endotracheal tube terminates 4.5 cm above the carina, at the level of the clavicles. Cardiac enlargement is stable.  Moderate pulmonary vascular congestion has increased slightly.  Bibasilar airspace disease is evident, worse on the left.  A small left pleural effusion is suspected.  IMPRESSION:  1.  Satisfactory positioning of the endotracheal tube. 2.  Stable cardiomegaly with increasing pulmonary vascular congestion, concerning for congestive heart failure. 3.  Bibasilar airspace disease likely reflects atelectasis, infection is not excluded. 4.  Probable small left pleural effusion.   Original Report Authenticated By: Marin Roberts, M.D.        ASSESSMENT / PLAN:  PULMONARY A:  -Acute respiratory failure secondary to decompensated CHF - pulmonary hypertension -possible aspiration -OSA with ?OHS -tobacco use with ? COPD P:  -nasally intubated -Goal SpO2s > 92% -repeat CXR -consider outpatient evaluation for OSA after acute illness.   -consider nicotine patch to wean  CARDIOVASCULAR A:  -Decompensated CHF -Hyperlipidemia P:  -echo pending -Check BNP -Check CBC -subq heparin DVT prophylaxis as tolerated -clinically appears hypervolemic >> diuresis per primary team  RENAL A:  No current active issues P:   -check CMP now for renal function -follow BMP in am  GASTROINTESTINAL A:  No active issues P:   -Protonix for GI prophylaxis  HEMATOLOGIC A:  No active issues P:  -F/u blood cultures -CBCs now -CBC tomorrow am  INFECTIOUS A:   -Cellulitis -?CAP  P:   -Start Vanc for ? MRSA coverage of cellulitis and cross coverage for CAP -Start Levaquin for CAP coverage -blood cultures -sputum cultures -Strep pneumo urinary antigens -procalcitonin algorithm  ENDOCRINE A:  Type II DM   P:   -SSI protocol, goal tight control -hold home meds  NEUROLOGIC A:  No active issues P:   -continue to monitor closely  TODAY'S SUMMARY: 47 y.o. Obese white male, smoker with a history of inferior MI, 5 vessel CABG, type II DM, and hypertension presents from [facility] where he was found unresponsive.  Patient was stabilized and nasally intubated.  Respiratory failure was likely secondary to decompensated CHF.  We will treat both cellulitis and ?CAP.  Will monitor closely on current ventilator setting.  New ABGs drawn will reevaluate for when we can extubate.  Currently treating for Cellulitis and ?CAP with Vanc and levaquin.  Will monitor results of tests accordingly.  We appreciate being consulted on this case.  I have personally obtained a history, examined the patient, evaluated laboratory and imaging  results, formulated the assessment and plan and placed orders. CRITICAL CARE: The patient is critically ill with multiple organ systems failure and requires high complexity decision making for assessment and support, frequent evaluation and titration of therapies, application of advanced monitoring technologies and extensive interpretation of multiple databases. Critical Care Time devoted to patient care services described in this note is 40 minutes.    Carley Hammed Physician Student 01/17/2013, 2:35 PM   Reviewed above, examined pt, and agree with assessment/plan.  He has acute on chronic respiratory failure from acute diastolic heart failure, acute pulmonary edema, OSA with presumed OHS, possible COPD, probable aspiration pneumonia, and cellulitis.  Will leave nasally intubated for now >> likely difficulty airway, and he is tolerating nasal intubation >> do not want to lose  airway in this situation.  Continue Abx, and diuresis.  F/u CXR and ABG.  Coralyn Helling, MD [facility] Pulmonary/Critical Care 01/17/2013, 4:30 PM Pager:  [phone] After 3pm call: [phone]

## 2013-01-17 NOTE — H&P (Signed)
[facility]                                       [place], [place]  [address]       NAME:  Cesar Cantrell, Cesar Cantrell                  ROOM:          130-86 UNIT NUMBER:  [id number]                        LOCATION:      [facility]CU ADM/VISIT DATE:     01/16/13                ADM PHYS:      Timmie Foerster MD ACCT: [id number]                               DOB:           09-27-2065    CARDIOLOGY CONSULTATION REPORT    PRIMARY CARDIOLOGIST:  Bonnee Quin, M.D.    PRIMARY PHYSICIAN:  Quintin Alto, M.D.    REFERRING PHYSICIAN:  Wende Crease, M.D., [facility] hospitalist.    REASON FOR CONSULTATION:  Right heart failure, anasarca.    HISTORY OF PRESENT ILLNESS:  Cesar Cantrell is a 47 year old male, with history of multivessel CAD, status post acute inferior myocardial infarction with associated cardiogenic shock in February 2006, initially treated with emergent drug-eluting stenting of a 100% occluded mid RCA stenosis, followed by 5-vessel CABG, by Cesar Cantrell.  Initial ejection fraction 39% by cardiac catheterization, but subsequent normalization, as assessed by most recent echocardiogram (EF 55% to 60%), in 2011.    Patient was most recently seen for routine followup in our [facility] office in March of this year, by Dr. Riley Kill.  He was noted to still smoke tobacco, but otherwise was in stable condition from a cardiovascular standpoint, reporting no ischemic symptoms.  No medication adjustments were recommended.    Patient was admitted directly from Dr. [name] office yesterday for further evaluation and management of marked bilateral lower extremity edema and erythema, worrisome for cellulitis.  He was placed on IV Lasix and had a good negative diuretic response over the last 24 hours.  There was also concern for cor pulmonale and right heart failure.  An echocardiogram was performed, with results currently pending.  Patient was also placed  on IV antibiotics for treatment of possible cellulitis.    Earlier this morning, patient developed respiratory failure, with corresponding ABG suggestive of uncompensated respiratory acidosis: pH 7.21, pCO2 98, pO2 63, and bicarb 39, on 5L.  Patient was transferred directly to the intensive care unit, where he is currently on BiPAP.    In terms of symptoms, he denies any current or recent chest pain or shortness of breath.  He suggests that the reason he was seen in the office yesterday was for his peripheral edema.  He also is noted to have a rash, but states that he has had it for "30 years."    ALLERGIES:  None.    HOME MEDICATIONS: 1.  Aspirin 81 mg daily. 2.   Glipizide ER 10 mg daily. 3.   Metformin 1000 mg b.i.d. 4.   Simvastatin 40 mg at bedtime.    PAST MEDICAL HISTORY: 1.   Multivessel CAD.      A.   Status post acute inferior MI with cardiogenic shock/emergent DES           100% mid RCA, 09/2004.      B.   Subsequent 5-vessel CABG, by Cesar Cantrell:  Grafting of           LIMA-LAD; left radial artery - DX1; SVG - ramus intermedius; SVG - PD           - PLA.      C.   Initial EF 39%, by cardiac catheterization. 2.   HTN. 3.   DM. 4.   Pulmonary hypertension. 5.   Pneumonia. 6.   Tobacco.    SOCIAL HISTORY:  He admits to smoking "every now and then".    FAMILY HISTORY:  Negative for premature coronary artery disease.    REVIEW OF SYSTEMS:  Deferred; patient currently on BiPAP support.    PHYSICAL EXAMINATION:  Vital signs:  Current blood pressure 155/83, pulse 93, respirations 28, temperature afebrile, sats 90% on 5L, weight 315 pounds. General:  A 47 year old male, morbidly obese, sitting upright.  HEENT: Normocephalic, atraumatic; BiPAP mask affixed.  Neck:  Jugular venous distention noted on the right, at 60 degrees.  No carotid bruits.  Lungs: Marked coarse expiratory wheezes throughout.  Heart:  Respirations rhythm.  No significant murmurs or  rubs.  Abdomen:  Protuberant, soft.  Extremities:  3 to 4+ bilateral peripheral edema.  Skin:  Generalized erythematous rash, blanching, noticeably on both legs and extending up to the upper chest region. Musculoskeletal:  No obvious deformity.  Neurologic:  No focal deficit.    RADIOLOGIC STUDIES:  Chest x-ray:  Cardiac silhouette enlargement; low lung volumes; upper lobe vascular prominence, with pulmonary vascular congestion pattern; right hemidiaphragm elevation with right lung base atelectasis; minimal left basilar atelectasis.    Admission EKG:  Normal sinus rhythm at 96 bpm; normal axis; Q-waves inferiorly; isolated PVC.    LABORATORY DATA:  CPK 32/1.1, troponin I less than 0.01.  Albumin 3.4.  D-dimer 0.56.  BNP 150 on admission.  Sodium 138, potassium 4.6, pCO2 of 40, BUN 5, creatinine 0.4, and glucose 216.  WBC 7600, hemoglobin 15, hematocrit 45, and platelets 203,000.    Lower extremity venous Dopplers:  Negative for bilateral DVT.    IMPRESSION: 1.   Acute respiratory failure.      A.   Hypercarbic/hypoxemic.      B.   Treated with bilevel positive airway pressure. 2.   Bilateral peripheral edema.      A.   Presumed right heart failure. 3.   Cellulitis. 4.   History of normal left ventricular function. 5.   Multivessel coronary artery disease.      A.   As outlined above. 6.   Generalized rash. 7.   Pulmonary hypertension. 8.   Morbid obesity.    PLAN: 1.   We will follow up on echocardiogram study for reassessment of LV function,      and rule out of underlying structural abnormalities. 2.   Aggressive diuretic regimen recommended, as currently outlined.  Close      monitoring of volume status and strict I/O's and daily weights.  Followup      labs in a.m., for close  monitoring of electrolytes and renal function. 3.   Continue cycling cardiac markers to rule out underlying ischemia. 4.   Recommendation is to transfer patient directly to [facility] for       continued close monitoring and aggressive management of respiratory      failure.  Patient will require aggressive diuretic management and referral      to our heart failure team. 5.   We will also arrange for a transesophageal echocardiogram study, in      follow up of the recently reviewed echocardiogram, by Dr. Diona Browner      (results currently pending). 6.   We will also obtain blood cultures, although patient has already been      started on IV antibiotics, which will be place on hold.   Patient seen and examined in conjunction with Dr. Diona Browner.  An extensive review was done of patient's prior hospitalization records.                                                  ______________________________                                             Prescott Parma, PA-C                                              ______________________________                                            Nona Dell, MD, [facility]   Attending note:  Patient seen and examined. Reviewed available records and discuss with Mr. Magnus Sinning. Mr. Baudoin presents with progressive weight gain of approximately 25 pounds associated with worsening leg edema and increasing abdominal girth. Symptoms have progressed over a period of weeks, reportedly not associated with any chest pain or palpitations, no definite fevers or chills. He has had weeping of his lower extremities also maculopapular rash on his legs. He was admitted by his primary care physician with concerned about cellulitis and started empirically on Rocephin, blood cultures sent this morning. Consulted by the hospitalist team due to concerns about associated right heart failure.  Patient was transferred to the unit earlier this morning with documentation of respiratory failure, hypoxic and hypercarbic by a ABG. He was placed on BiPAP but his repeat ABG shows worsening pCO2 with plans underway per primary team for intubation and mechanical ventilation.  Patient has  evidence of significant volume overload and anasarca on examination. Chest x-ray demonstrates cardiomegaly with pulmonary vascular congestion, elevated right hemidiaphragm with atelectasis, no definite effusions or infiltrates. ECG shows sinus rhythm and consistent with prior inferior infarct, cardiac markers are normal at this time. Lower extremity venous studies did not demonstrate DVT. Renal function and electrolytes normal, BNP 134, WBC 10.5, normal hemoglobin, ammonia level 62. ABG with pH 7.21, pCO2 98, pO2 63, bicarbonate 39 - prior to intubation.  Echocardiogram report summarized here: Conclusions:      1. The left ventricular  chamber size is normal. Moderate concentric      left ventricular hypertrophy is observed. There is normal left      ventricular systolic function. The estimated ejection fraction is      55-60%.  There is septal flattening of the interventricular septum      consistent with right ventricular volume and pressure overload.  The      left ventricular diastolic filling pattern is restrictive, ungraded.      The left ventricular diastolic filling pattern is consistent with      elevated mean left atrial pressure. The basal inferior wall segment is      hypokinetic (score 2). Overall wallmotion score index is  1.06      2. The left atrium is mildly dilated.      3. There is a trace of mitral regurgitation.      4. The aortic valve leaflets are mildly thickened. Rounded echodensity      noted in the LVOT, appears to be possibly affixed to the left coronary      cusp, not particularly mobile. Heterogeneous in echodensity. Cannot      exclude old vegetation. Would be unusual location for cardiac mass.      There is no evidence of aortic regurgitation. The mean gradient of the      aortic valve is 6 mmHg.      5. The right ventricle is mild to moderately dilated.  The right      ventricular global systolic function is moderately reduced.      6. The right atrium is  moderately dilated.  The interatrial septum      bowed toward the left consistent with elevated right atrial pressure.      7. There is mild to intermittently moderate tricuspid regurgitation.      The right ventricular systolic pressure is calculated at 62 mmHg.       There is evidence of moderate to severe pulmonary hypertension.      8. There is no pericardial effusion.      9. CVP estimated at 15 mm mercury.  Situation was reviewed with the patient and his mother present. Plan is to transfer him via [facility] to the ICU at [facility] for further evaluation and management. He is critically ill with complex presentation, has evidence of hypoxic/hypercarbic respiratory failure in the face of presumably diastolic heart failure (restrictive) with associated pulmonary hypertension and right heart failure with volume overload/anasarca. Certainly also concern for endocarditis, although not all features are consistent with this. He will clearly need a TEE to better assess the aortic valve, likely able to be done in the short term while he is mechanically ventilated to better guide further management. As noted above, he was started empirically on Rocephin by the primary team, blood cultures only obtained this morning. Would obtain repeat sets at [facility]. Management by the advanced heart failure team is suggested with input from critical care.  Jonelle Sidle, M.D., F.A.C.C.

## 2013-01-17 NOTE — Progress Notes (Signed)
Utilization review completed. Navi Ewton, RN, BSN. 

## 2013-01-18 ENCOUNTER — Inpatient Hospital Stay (HOSPITAL_COMMUNITY): Payer: Medicaid - Out of State

## 2013-01-18 DIAGNOSIS — E662 Morbid (severe) obesity with alveolar hypoventilation: Secondary | ICD-10-CM

## 2013-01-18 DIAGNOSIS — L0390 Cellulitis, unspecified: Secondary | ICD-10-CM

## 2013-01-18 DIAGNOSIS — I509 Heart failure, unspecified: Secondary | ICD-10-CM

## 2013-01-18 DIAGNOSIS — E873 Alkalosis: Secondary | ICD-10-CM

## 2013-01-18 DIAGNOSIS — I5033 Acute on chronic diastolic (congestive) heart failure: Principal | ICD-10-CM

## 2013-01-18 DIAGNOSIS — L0291 Cutaneous abscess, unspecified: Secondary | ICD-10-CM

## 2013-01-18 LAB — CBC
MCH: 25.9 pg — ABNORMAL LOW (ref 26.0–34.0)
Platelets: 189 10*3/uL (ref 150–400)
RBC: 5.44 MIL/uL (ref 4.22–5.81)
WBC: 9.7 10*3/uL (ref 4.0–10.5)

## 2013-01-18 LAB — GLUCOSE, CAPILLARY
Glucose-Capillary: 112 mg/dL — ABNORMAL HIGH (ref 70–99)
Glucose-Capillary: 118 mg/dL — ABNORMAL HIGH (ref 70–99)
Glucose-Capillary: 133 mg/dL — ABNORMAL HIGH (ref 70–99)
Glucose-Capillary: 155 mg/dL — ABNORMAL HIGH (ref 70–99)
Glucose-Capillary: 258 mg/dL — ABNORMAL HIGH (ref 70–99)

## 2013-01-18 LAB — BASIC METABOLIC PANEL
Calcium: 8.2 mg/dL — ABNORMAL LOW (ref 8.4–10.5)
GFR calc Af Amer: 77 mL/min — ABNORMAL LOW (ref >90)
GFR calc non Af Amer: 67 mL/min — ABNORMAL LOW (ref >90)
Glucose, Bld: 136 mg/dL — ABNORMAL HIGH (ref 70–99)
Sodium: 137 mEq/L (ref 135–145)

## 2013-01-18 LAB — BLOOD GAS, ARTERIAL
Acid-Base Excess: 9.6 mmol/L — ABNORMAL HIGH (ref 0.0–2.0)
FIO2: 0.5 %
MECHVT: 550 mL
O2 Saturation: 92.2 %
TCO2: 37 mmol/L (ref 0–100)
pCO2 arterial: 62.4 mmHg (ref 35.0–45.0)

## 2013-01-18 LAB — PROCALCITONIN: Procalcitonin: <0.1 ng/mL

## 2013-01-18 LAB — PRO B NATRIURETIC PEPTIDE: Pro B Natriuretic peptide (BNP): 243.7 pg/mL — ABNORMAL HIGH (ref 0–125)

## 2013-01-18 MED ORDER — POTASSIUM CHLORIDE CRYS ER 20 MEQ PO TBCR
EXTENDED_RELEASE_TABLET | ORAL | Status: AC
  Administered 2013-01-18: 40 meq via ORAL
  Filled 2013-01-18: qty 2

## 2013-01-18 MED ORDER — BIOTENE DRY MOUTH MT LIQD
15.0000 mL | Freq: Two times a day (BID) | OROMUCOSAL | Status: DC
  Administered 2013-01-19 – 2013-01-27 (×16): 15 mL via OROMUCOSAL

## 2013-01-18 MED ORDER — POTASSIUM CHLORIDE 10 MEQ/100ML IV SOLN
10.0000 meq | INTRAVENOUS | Status: AC
  Administered 2013-01-18 (×4): 10 meq via INTRAVENOUS

## 2013-01-18 MED ORDER — POTASSIUM CHLORIDE CRYS ER 20 MEQ PO TBCR: 40.0000 meq | EXTENDED_RELEASE_TABLET | Freq: Once | ORAL | Status: DC

## 2013-01-18 MED ORDER — POTASSIUM CHLORIDE 10 MEQ/100ML IV SOLN
INTRAVENOUS | Status: AC
  Filled 2013-01-18: qty 400

## 2013-01-18 MED ORDER — SODIUM CHLORIDE 0.9 % IV SOLN: INTRAVENOUS | Status: DC

## 2013-01-18 MED ORDER — POTASSIUM CHLORIDE CRYS ER 20 MEQ PO TBCR
40.0000 meq | EXTENDED_RELEASE_TABLET | Freq: Three times a day (TID) | ORAL | Status: AC
  Administered 2013-01-18 (×2): 40 meq via ORAL
  Filled 2013-01-18 (×2): qty 2

## 2013-01-18 MED ORDER — ATORVASTATIN CALCIUM 20 MG PO TABS
20.0000 mg | ORAL_TABLET | Freq: Every day | ORAL | Status: DC
  Administered 2013-01-18 – 2013-01-28 (×11): 20 mg via ORAL
  Filled 2013-01-18 (×12): qty 1

## 2013-01-18 MED ORDER — INSULIN ASPART 100 UNIT/ML ~~LOC~~ SOLN
0.0000 [IU] | Freq: Three times a day (TID) | SUBCUTANEOUS | Status: DC
  Administered 2013-01-19: 4 [IU] via SUBCUTANEOUS
  Administered 2013-01-19: 12:00:00 via SUBCUTANEOUS
  Administered 2013-01-19: 4 [IU] via SUBCUTANEOUS
  Administered 2013-01-20: 18:00:00 via SUBCUTANEOUS
  Administered 2013-01-20 (×2): 4 [IU] via SUBCUTANEOUS
  Administered 2013-01-21 (×2): via SUBCUTANEOUS
  Administered 2013-01-21: 7 [IU] via SUBCUTANEOUS
  Administered 2013-01-22: 4 [IU] via SUBCUTANEOUS
  Administered 2013-01-22 (×2): 7 [IU] via SUBCUTANEOUS
  Administered 2013-01-23: 13:00:00 via SUBCUTANEOUS
  Administered 2013-01-23 (×2): 4 [IU] via SUBCUTANEOUS
  Administered 2013-01-24 (×2): 7 [IU] via SUBCUTANEOUS
  Administered 2013-01-24 – 2013-01-25 (×2): 4 [IU] via SUBCUTANEOUS
  Administered 2013-01-25 (×2): 7 [IU] via SUBCUTANEOUS
  Administered 2013-01-26: 4 [IU] via SUBCUTANEOUS
  Administered 2013-01-26 (×2): 7 [IU] via SUBCUTANEOUS
  Administered 2013-01-27: 11 [IU] via SUBCUTANEOUS
  Administered 2013-01-27: 4 [IU] via SUBCUTANEOUS
  Administered 2013-01-27: 7 [IU] via SUBCUTANEOUS
  Administered 2013-01-28: 11 [IU] via SUBCUTANEOUS
  Administered 2013-01-28 (×2): 7 [IU] via SUBCUTANEOUS
  Administered 2013-01-29: 4 [IU] via SUBCUTANEOUS
  Administered 2013-01-29: 7 [IU] via SUBCUTANEOUS

## 2013-01-18 MED ORDER — FUROSEMIDE 10 MG/ML IJ SOLN
40.0000 mg | Freq: Three times a day (TID) | INTRAMUSCULAR | Status: DC
  Administered 2013-01-18 – 2013-01-19 (×4): 40 mg via INTRAVENOUS
  Filled 2013-01-18 (×5): qty 4

## 2013-01-18 NOTE — Progress Notes (Signed)
Patient ID: Cesar Cantrell, male   DOB: 09/22/1965, 47 y.o.   MRN: [id number]    SUBJECTIVE: Intubated/sedated this morning.  No significant events overnight.  Not much diuresis.    Marland Kitchen antiseptic oral rinse  15 mL Mouth Rinse QID  . aspirin EC  81 mg Oral Daily  . chlorhexidine  15 mL Mouth Rinse BID  . furosemide  20 mg Intravenous BID  . heparin subcutaneous  5,000 Units Subcutaneous Q8H  . insulin aspart  0-20 Units Subcutaneous Q4H  . levofloxacin (LEVAQUIN) IV  500 mg Intravenous Q24H  . pantoprazole (PROTONIX) IV  40 mg Intravenous Q24H  . vancomycin  1,250 mg Intravenous Q12H      Filed Vitals:   01/18/13 0434 01/18/13 0500 01/18/13 0600 01/18/13 0700  BP:  116/52 119/63 128/67  Pulse:  78 69 78  Temp:      TempSrc:      Resp:  18 18 19   Height:      Weight:      SpO2: 91% 96% 95% 95%    Intake/Output Summary (Last 24 hours) at 01/18/13 0752 Last data filed at 01/18/13 0700  Gross per 24 hour  Intake   1675 ml  Output    635 ml  Net   1040 ml    LABS: Basic Metabolic Panel:  Recent Labs  40/98/11 1615 01/18/13 0520  NA 138 137  K 4.4 3.9  CL 96 93*  CO2 36* 35*  GLUCOSE 144* 136*  BUN 7 14  CREATININE 0.88 1.28  CALCIUM 8.2* 8.2*   Liver Function Tests:  Recent Labs  01/17/13 1615  AST 10  ALT 12  ALKPHOS 91  BILITOT 0.3  PROT 6.1  ALBUMIN 2.8*   No results found for this basename: LIPASE, AMYLASE,  in the last 72 hours CBC:  Recent Labs  01/17/13 1615 01/18/13 0520  WBC 11.0* 9.7  NEUTROABS 9.0*  --   HGB 14.5 14.1  HCT 44.5 43.9  MCV 81.5 80.7  PLT 213 189   Cardiac Enzymes: No results found for this basename: CKTOTAL, CKMB, CKMBINDEX, TROPONINI,  in the last 72 hours BNP: No components found with this basename: POCBNP,  D-Dimer: No results found for this basename: DDIMER,  in the last 72 hours Hemoglobin A1C: No results found for this basename: HGBA1C,  in the last 72 hours Fasting Lipid Panel: No results found for this  basename: CHOL, HDL, LDLCALC, TRIG, CHOLHDL, LDLDIRECT,  in the last 72 hours Thyroid Function Tests: No results found for this basename: TSH, T4TOTAL, FREET3, T3FREE, THYROIDAB,  in the last 72 hours Anemia Panel: No results found for this basename: VITAMINB12, FOLATE, FERRITIN, TIBC, IRON, RETICCTPCT,  in the last 72 hours  RADIOLOGY: Dg Chest Port 1 View  01/17/2013   *RADIOLOGY REPORT*  Clinical Data: Left lower lobe infiltrate.  Intubation.  PORTABLE CHEST - 1 VIEW  Comparison: One-view chest 01/17/2013.  Findings: The patient is now intubated.  Endotracheal tube terminates 4.5 cm above the carina, at the level of the clavicles. Cardiac enlargement is stable.  Moderate pulmonary vascular congestion has increased slightly.  Bibasilar airspace disease is evident, worse on the left.  A small left pleural effusion is suspected.  IMPRESSION:  1.  Satisfactory positioning of the endotracheal tube. 2.  Stable cardiomegaly with increasing pulmonary vascular congestion, concerning for congestive heart failure. 3.  Bibasilar airspace disease likely reflects atelectasis, infection is not excluded. 4.  Probable small left pleural effusion.  Original Report Authenticated By: Marin Roberts, M.D.    PHYSICAL EXAM General: Intubated/sedated Neck: JVP 12-14 cm, no thyromegaly or thyroid nodule.  Lungs: Clear anteriorly CV: Nondisplaced PMI.  Heart regular S1/S2, no S3/S4, no murmur.  1-2+ chronic edema to knees bilaterally.   Abdomen: Soft, obese, no distention.  Extremities: No clubbing or cyanosis.   TELEMETRY: Reviewed telemetry pt in NSR  ASSESSMENT AND PLAN: 46 yo with h/o CAD s/p CABG, DM, and ?COPD presented with dyspnea to [facility], ended up intubated due to significant respiratory distress.  1. Pulmonary: Respiratory failure.  ? Primary etiology.  He is certainly volume overloaded on exam with elevated JVP despite BNP not being particularly high.  He has bibasilar infiltrates (PNA versus  atelectasis).  PCT is low.  I suspect infection is not the primary driver here.  May be diastolic CHF/right heart failure in the setting of significant ?COPD and ?OHS/OSA. CCM managing vent.  2. 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  Echo at [facility] with EF 55-60% and dilated/dysfunctional RV with moderate-severe pulmonary HTN.  RV findings raise concern for OHS/OSA.  I think that he appears volume overloaded though body habitus difficult for exam.  - Would place PICC, follow CVPs. - Increase Lasix to 40 mg IV every 8 hours.  - If diuresis poor/creatinine rises, would consider addition of milrinone to support RV/lower PA pressure.  - RHC early next week.  3. Abnormal aortic valve: Question of old vegetation.  He is afebrile.  During stay will need TEE.  4. CAD: Doubt ACS.  Continue statin, ASA.    Marca Ancona 01/18/2013 8:00 AM

## 2013-01-18 NOTE — Progress Notes (Signed)
PULMONARY  / CRITICAL CARE MEDICINE  Name: Cesar Cantrell MRN: [id number] DOB: [birth year]    ADMISSION DATE:  01/17/2013 CONSULTATION DATE:  01/17/13  REFERRING MD :  Dr. Riley Kill PRIMARY SERVICE:  Cardiology  CHIEF COMPLAINT:  Dyspnea       BRIEF PATIENT DESCRIPTION:  47 y.o. Obese white male, smoker with a history of inferior MI, 5 vessel CABG, type II DM, and hypertension presents from [facility] where he was found unresponsive.  PCCM asked to consult on ventilatory support and pulmonary aspects of case.         SIGNIFICANT EVENTS / STUDIES:  6/13 Admitted in transfer from [facility] to Cards service 6/13 Echo: LVEF 55-60%, mild LA enlargement, RVSP est 30 mmHg   LINES / TUBES: 6/13 Nasal ETT >> 6/14   CULTURES: 6/13 Blood >> 6/13 Sputum >> 6/13 Strep Antigen >> NEG  ANTIBIOTICS: 6/13 Vanc >>> 6/13 Levaquin >>>    SUBJECTIVE:  Passed SBT. + F/C. RASS 0. Extubated and looks good  VITAL SIGNS: Temp:  [97.7 F (36.5 C)-99.8 F (37.7 C)] 97.7 F (36.5 C) (06/14 1600) Pulse Rate:  [65-98] 90 (06/14 1300) Resp:  [16-26] 23 (06/14 1300) BP: (96-143)/(46-71) 125/66 mmHg (06/14 1300) SpO2:  [89 %-98 %] 95 % (06/14 1300) FiO2 (%):  [40 %-60 %] 50 % (06/14 1600) Weight:  [141.5 kg (311 lb 15.2 oz)] 141.5 kg (311 lb 15.2 oz) (06/14 0400)  PHYSICAL EXAMINATION: General:  Obese white male Neuro:  No focal deficits HEENT:  NCAT, PERLA, EOM intact Cardiovascular: RRR s M Lungs: No wheezes  Abdomen:  Obese, soft, NT Ext: severe LE edema and chronic stasis changes      Recent Labs Lab 01/17/13 1615 01/18/13 0520  NA 138 137  K 4.4 3.9  CL 96 93*  CO2 36* 35*  BUN 7 14  CREATININE 0.88 1.28  GLUCOSE 144* 136*    Recent Labs Lab 01/17/13 1615 01/18/13 0520  HGB 14.5 14.1  HCT 44.5 43.9  WBC 11.0* 9.7  PLT 213 189    ABG    Component Value Date/Time   PHART 7.368 01/18/2013 0336   PCO2ART 62.4* 01/18/2013 0336   PO2ART 64.5* 01/18/2013 0336   HCO3  35.1* 01/18/2013 0336   TCO2 37.0 01/18/2013 0336   O2SAT 92.2 01/18/2013 0336     CXR: NSC      ASSESSMENT / PLAN:  PULMONARY A:  Acute on chronic respiratory failure Suspect decompensated OHS possible aspiration H/O OSA Smoker P:  Extubated Monitor in ICU   CARDIOVASCULAR A:  CAD H/O CHF Hyperlipidemia P:  -Mgmt per Cards  RENAL A:  Hypervolemia Met alkalosis due to loop diuresis P:   Diuresis as permitted by BP and renal function Replete K+ aggressively to avoid worsening alkalosis  Might need intermittent acetazolamide   GASTROINTESTINAL A:  Obesity P:   -Protonix for GI prophylaxis CHO mod diet if tolerates extubation   HEMATOLOGIC A:  No active issues P:  Monitor intermittently  INFECTIOUS A:   -LE Cellulitis/chronic venous stasis -?CAP  P:   Micro and abx as above   ENDOCRINE A:  Type II DM   P:   -Cont SSI  NEUROLOGIC A:  No active issues P:   -continue to monitor closely  35 mins CCM   Billy Fischer, MD ; [facility] service Mobile [phone].  After 5:30 PM or weekends, call [phone]

## 2013-01-18 NOTE — Progress Notes (Signed)
Critical ABG results called to CCM physician by RT.

## 2013-01-18 NOTE — Progress Notes (Signed)
INITIAL NUTRITION ASSESSMENT  DOCUMENTATION CODES Per approved criteria  -Morbid Obesity   INTERVENTION: - If pt unable to be extubated in the next 24-48 hours, recommend initiation of Promote start at 46ml/hr increase by 10ml every 4 hours to goal of 65ml/hr with Prostat 60ml TID, this will provide 2040 calories, 180g protein meeting 79% estimated calorie needs, 99% estimated protein needs. This provides 14 calories/kilogram of actual body weight.  - Unit RD to continue to monitor   NUTRITION DIAGNOSIS: Inadequate oral intake related to inability to eat as evidenced by NPO, mechanical ventilation.   Goal: Enteral nutrition to provide 60-70% of estimated calorie needs (22-25 kcals/kg ideal body weight) and 100% of estimated protein needs, based on ASPEN guidelines for permissive underfeeding in critically ill obese individuals.  Monitor:  Weights, labs, TF initiation/tolerance, vent status  Reason for Assessment: Ventilated   47 y.o. male  Admitting Dx: Dyspnea  ASSESSMENT: Pt transferred from [facility] where he was admitted for right heart failure and anasarca and then developed respiratory failure. Pt intubated yesterday.   Patient is currently intubated on ventilator support.  MV: 9.6 L/min Temp:Temp (24hrs), Avg:99 F (37.2 C), Min:98 F (36.7 C), Max:99.8 F (37.7 C)   Height: Ht Readings from Last 1 Encounters:  01/17/13 5\' 9"  (1.753 m)    Weight: Wt Readings from Last 1 Encounters:  01/18/13 311 lb 15.2 oz (141.5 kg)    Ideal Body Weight: 160 lb  % Ideal Body Weight: 194  Wt Readings from Last 10 Encounters:  01/18/13 311 lb 15.2 oz (141.5 kg)  10/17/12 300 lb 12.8 oz (136.442 kg)  11/03/10 293 lb 1.9 oz (132.958 kg)  06/23/09 292 lb (132.45 kg)    Usual Body Weight: 300 lb in March 2014  % Usual Body Weight: 104  BMI:  Body mass index is 46.05 kg/(m^2). Class III extreme obesity  Estimated Nutritional Needs: Kcal: 2585 Protein:  182g Fluid: >2.5L/day  Skin: blister on lower leg, bilateral scattered abrasions and pinhole blisters, +1 generalized edema, +2 RLE, LLE edema  Diet Order:  No diet ordered   EDUCATION NEEDS: -No education needs identified at this time   Intake/Output Summary (Last 24 hours) at 01/18/13 0921 Last data filed at 01/18/13 0839  Gross per 24 hour  Intake   1775 ml  Output    635 ml  Net   1140 ml    Last BM: PTA  Labs:   Recent Labs Lab 01/17/13 1615 01/18/13 0520  NA 138 137  K 4.4 3.9  CL 96 93*  CO2 36* 35*  BUN 7 14  CREATININE 0.88 1.28  CALCIUM 8.2* 8.2*  GLUCOSE 144* 136*    CBG (last 3)   Recent Labs  01/18/13 0027 01/18/13 0418 01/18/13 0804  GLUCAP 118* 155* 130*    Scheduled Meds: . antiseptic oral rinse  15 mL Mouth Rinse QID  . aspirin EC  81 mg Oral Daily  . atorvastatin  20 mg Oral q1800  . chlorhexidine  15 mL Mouth Rinse BID  . furosemide  40 mg Intravenous Q8H  . heparin subcutaneous  5,000 Units Subcutaneous Q8H  . insulin aspart  0-20 Units Subcutaneous Q4H  . levofloxacin (LEVAQUIN) IV  500 mg Intravenous Q24H  . pantoprazole (PROTONIX) IV  40 mg Intravenous Q24H  . potassium chloride  10 mEq Intravenous Q1 Hr x 4  . vancomycin  1,250 mg Intravenous Q12H    Continuous Infusions: . sodium chloride 10 mL/hr at  01/18/13 0930    Past Medical History  Diagnosis Date  . Coronary artery disease   . Hypertension   . Pneumonia   . Pulmonary hypertension   . Diabetes mellitus     type 2    Past Surgical History  Procedure Laterality Date  . Coronary artery bypass graft  09-20-04    x5 LIMA to LAD, left radial to D1, SVG to ramus and SVG to PDA/PLA     Levon Hedger MS, RD, LDN [phone] Weekend/After Hours Pager

## 2013-01-18 NOTE — Procedures (Signed)
**Note De-Identified Kemyra August Obfuscation** Extubation Procedure Note  Patient Details:   Name: Cesar Cantrell DOB: Jun 17, 1966 MRN: [id number]   Airway Documentation:     Evaluation  O2 sats: transiently fell during during procedure Complications: No apparent complications Patient did tolerate procedure well. Bilateral Breath Sounds: Rhonchi Suctioning: Airway Yes + leak, no stridor noted, SAT 89% on 6L [place] with humidification Godric Lavell, Megan Salon 01/18/2013, 11:48 AM

## 2013-01-19 DIAGNOSIS — I2781 Cor pulmonale (chronic): Secondary | ICD-10-CM

## 2013-01-19 LAB — BASIC METABOLIC PANEL
BUN: 21 mg/dL (ref 6–23)
CO2: 35 mEq/L — ABNORMAL HIGH (ref 19–32)
Calcium: 9 mg/dL (ref 8.4–10.5)
Chloride: 92 mEq/L — ABNORMAL LOW (ref 96–112)
Chloride: 92 mEq/L — ABNORMAL LOW (ref 96–112)
Creatinine, Ser: 1.6 mg/dL — ABNORMAL HIGH (ref 0.50–1.35)
GFR calc Af Amer: 56 mL/min — ABNORMAL LOW (ref >90)
GFR calc Af Amer: 54 mL/min — ABNORMAL LOW (ref >90)
GFR calc non Af Amer: 47 mL/min — ABNORMAL LOW (ref >90)
Glucose, Bld: 241 mg/dL — ABNORMAL HIGH (ref 70–99)
Potassium: 5.4 mEq/L — ABNORMAL HIGH (ref 3.5–5.1)
Potassium: 4.7 mEq/L (ref 3.5–5.1)
Sodium: 137 mEq/L (ref 135–145)
Sodium: 136 mEq/L (ref 135–145)

## 2013-01-19 LAB — GLUCOSE, CAPILLARY: Glucose-Capillary: 173 mg/dL — ABNORMAL HIGH (ref 70–99)

## 2013-01-19 LAB — CBC
MCV: 80.4 fL (ref 78.0–100.0)
Platelets: 207 10*3/uL (ref 150–400)
RBC: 5.88 MIL/uL — ABNORMAL HIGH (ref 4.22–5.81)
WBC: 12.6 10*3/uL — ABNORMAL HIGH (ref 4.0–10.5)

## 2013-01-19 LAB — PROCALCITONIN: Procalcitonin: <0.1 ng/mL

## 2013-01-19 MED ORDER — TRIAMCINOLONE 0.1 % CREAM:EUCERIN CREAM 1:1
1.0000 "application " | TOPICAL_CREAM | Freq: Two times a day (BID) | CUTANEOUS | Status: DC
  Administered 2013-01-19 – 2013-01-29 (×20): 1 via TOPICAL
  Filled 2013-01-19 (×3): qty 1

## 2013-01-19 MED ORDER — ACETAZOLAMIDE SODIUM 500 MG IJ SOLR
500.0000 mg | Freq: Once | INTRAMUSCULAR | Status: AC
  Administered 2013-01-19: 500 mg via INTRAVENOUS
  Filled 2013-01-19: qty 500

## 2013-01-19 MED ORDER — ALBUTEROL SULFATE (5 MG/ML) 0.5% IN NEBU
2.5000 mg | INHALATION_SOLUTION | RESPIRATORY_TRACT | Status: DC | PRN
  Administered 2013-01-21: 2.5 mg via RESPIRATORY_TRACT
  Filled 2013-01-19: qty 0.5

## 2013-01-19 MED ORDER — FUROSEMIDE 10 MG/ML IJ SOLN
40.0000 mg | Freq: Two times a day (BID) | INTRAMUSCULAR | Status: DC
  Administered 2013-01-19 – 2013-01-25 (×13): 40 mg via INTRAVENOUS
  Filled 2013-01-19 (×16): qty 4

## 2013-01-19 MED ORDER — LEVOFLOXACIN 500 MG PO TABS
500.0000 mg | ORAL_TABLET | Freq: Every day | ORAL | Status: DC
  Administered 2013-01-19 – 2013-01-24 (×6): 500 mg via ORAL
  Filled 2013-01-19 (×6): qty 1

## 2013-01-19 MED ORDER — IPRATROPIUM BROMIDE 0.02 % IN SOLN
0.5000 mg | Freq: Four times a day (QID) | RESPIRATORY_TRACT | Status: DC
  Administered 2013-01-19 – 2013-01-29 (×36): 0.5 mg via RESPIRATORY_TRACT
  Filled 2013-01-19 (×40): qty 2.5

## 2013-01-19 MED ORDER — SODIUM CHLORIDE 0.9 % IJ SOLN
10.0000 mL | INTRAMUSCULAR | Status: DC | PRN
  Administered 2013-01-20 – 2013-01-25 (×4): 10 mL
  Administered 2013-01-26: 20 mL

## 2013-01-19 MED ORDER — ALBUTEROL SULFATE (5 MG/ML) 0.5% IN NEBU
2.5000 mg | INHALATION_SOLUTION | Freq: Four times a day (QID) | RESPIRATORY_TRACT | Status: DC
  Administered 2013-01-19 – 2013-01-29 (×36): 2.5 mg via RESPIRATORY_TRACT
  Filled 2013-01-19 (×40): qty 0.5

## 2013-01-19 NOTE — Progress Notes (Signed)
eLink Physician-Brief Progress Note Patient Name: Cesar Cantrell DOB: 04-26-1966 MRN: [id number]  Date of Service  01/19/2013   HPI/Events of Note  Patient extubated today and apparently has done well but by [facility] rounds this PM found to have sats in the high 70s to mid 80s but had taken off mask as well as had nasal cannula that was taken off.     eICU Interventions  Plan: Nasal cannula to be added back.  If no improvement in O2 sats BiPAP has been ordered with goal sats of 88 to 90%.   Intervention Category Intermediate Interventions: Respiratory distress - evaluation and management  Ceola Para 01/19/2013, 1:57 AM

## 2013-01-19 NOTE — Progress Notes (Signed)
PULMONARY  / CRITICAL CARE MEDICINE  Name: Cesar Cantrell MRN: [id number] DOB: [birth year]    ADMISSION DATE:  01/17/2013 CONSULTATION DATE:  01/17/13  REFERRING MD :  Dr. Riley Kill PRIMARY SERVICE:  Cardiology  CHIEF COMPLAINT:  Dyspnea       BRIEF PATIENT DESCRIPTION:  47 y.o. Obese white male, smoker with a history of inferior MI, 5 vessel CABG, type II DM, and hypertension presents from [facility] where he was found unresponsive.  PCCM asked to consult on ventilatory support and pulmonary aspects of case.         SIGNIFICANT EVENTS / STUDIES:  6/13 Admitted in transfer from [facility] to Cards service 6/13 Echo: LVEF 55-60%, mild LA enlargement, mod RA enlargement, mild to mod RV dilatation, estimated RVSP 62 mmHg   LINES / TUBES: Nasal ETT >> 6/14 RUE PICC 6/15 >>   CULTURES: 6/13 Strep Antigen >> NEG 6/13 Blood >> 6/13 Sputum >>    ANTIBIOTICS: 6/13 Vanc >>> 6/15 6/13 Levaquin >> 6/15 changed to PO >>    SUBJECTIVE:  No distress. Still requires VM with marginal sats on 50%. Asks to go home  VITAL SIGNS: Temp:  [97.7 F (36.5 C)-98.8 F (37.1 C)] 98.3 F (36.8 C) (06/15 0400) Pulse Rate:  [81-99] 95 (06/15 1000) Resp:  [18-35] 23 (06/15 1000) BP: (116-141)/(52-72) 116/56 mmHg (06/15 1000) SpO2:  [85 %-96 %] 86 % (06/15 1000) FiO2 (%):  [50 %] 50 % (06/15 0400) Weight:  [139.4 kg (307 lb 5.1 oz)] 139.4 kg (307 lb 5.1 oz) (06/15 0500)  PHYSICAL EXAMINATION: General:  Obese white male Neuro:  No focal deficits HEENT:  NCAT, PERLA, EOM intact Cardiovascular: RRR s M Lungs: No wheezes  Abdomen:  Obese, soft, NT Ext: severe LE edema and chronic stasis changes, stasis ulcers dressed in gauze   Recent Labs Lab 01/18/13 0520 01/19/13 0535 01/19/13 0753  NA 137 136 137  K 3.9 5.4* 5.4*  CL 93* 92* 92*  CO2 35* 35* 36*  BUN 14 21 21   CREATININE 1.28 1.60* 1.67*  GLUCOSE 136* 162* 172*    Recent Labs Lab 01/17/13 1615 01/18/13 0520 01/19/13 0535   HGB 14.5 14.1 15.5  HCT 44.5 43.9 47.3  WBC 11.0* 9.7 12.6*  PLT 213 189 207    ABG    Component Value Date/Time   PHART 7.368 01/18/2013 0336   PCO2ART 62.4* 01/18/2013 0336   PO2ART 64.5* 01/18/2013 0336   HCO3 35.1* 01/18/2013 0336   TCO2 37.0 01/18/2013 0336   O2SAT 92.2 01/18/2013 0336     CXR: No new film      ASSESSMENT / PLAN:  PULMONARY A:  Acute on chronic respiratory failure Suspect decompensated OHS History compatible with OSA - not formally diagnosed PAH by Echo 6/13 (@ [facility] - report in shadow chart) Smoker P:  Nebulized BDs Cont supplemental O2 Counseled re: smoking cessation Will need formal sleep eval after discharge Will likely need home O2  CARDIOVASCULAR A:  CAD H/O CHF Hyperlipidemia Cor pulmonale  P:  -Mgmt per Cards   RENAL A:  Hypervolemia Met alkalosis due to loop diuresis P:   Diuresis as permitted by BP and renal function Agree with diuresis to extent permitted by BP and renal function Diamox X 1 6/15   GASTROINTESTINAL A:  Obesity P:   Protonix no longer needed for SUP post ext CHO mod diet    HEMATOLOGIC A:  No active issues P:  Monitor intermittently  INFECTIOUS A:   -  LE Cellulitis/chronic venous stasis ulcers -Doubt CAP  P:   Micro and abx as above   ENDOCRINE A:  Type II DM   P:   -Cont SSI  NEUROLOGIC A:  No active issues P:   -continue to monitor closely  transfer to SDU 6/15  Billy Fischer, MD ; [facility] service Mobile [phone].  After 5:30 PM or weekends, call [phone]

## 2013-01-19 NOTE — Progress Notes (Signed)
Peripherally Inserted Central Catheter/Midline Placement  The IV Nurse has discussed with the patient and/or persons authorized to consent for the patient, the purpose of this procedure and the potential benefits and risks involved with this procedure.  The benefits include less needle sticks, lab draws from the catheter and patient may be discharged home with the catheter.  Risks include, but not limited to, infection, bleeding, blood clot (thrombus formation), and puncture of an artery; nerve damage and irregular heat beat.  Alternatives to this procedure were also discussed.  PICC/Midline Placement Documentation  PICC / Midline Double Lumen 01/19/13 PICC Right Basilic (Active)  Indication for Insertion or Continuance of Line Chronic illness with exacerbations (CF, Sickle Cell, etc.) 01/19/2013 10:24 AM  Length mark (cm) 3 cm 01/19/2013 10:24 AM  Site Assessment Clean;Dry;Intact 01/19/2013 10:24 AM  Lumen #1 Status Flushed;Saline locked;Blood return noted 01/19/2013 10:24 AM  Lumen #2 Status Flushed;Saline locked;Blood return noted 01/19/2013 10:24 AM  Dressing Type Transparent 01/19/2013 10:24 AM  Dressing Status Clean;Dry;Intact;Antimicrobial disc in place 01/19/2013 10:24 AM  Dressing Intervention New dressing 01/19/2013 10:24 AM  Dressing Change Due 01/26/13 01/19/2013 10:24 AM       Geoffery Spruce 01/19/2013, 10:25 AM

## 2013-01-19 NOTE — Progress Notes (Signed)
Patient ID: Cesar Cantrell, male   DOB: 07/22/1966, 47 y.o.   MRN: [id number]    SUBJECTIVE: Extubated yesterday.  Diuresed very well, weight down 4 lbs.  No PICC nurse available yesterday so never got PICC.  Creatinine and K up today.  He continues to require oxygen by facemask with oxygen saturation upper 80s/low 90s.   Marland Kitchen antiseptic oral rinse  15 mL Mouth Rinse BID  . aspirin EC  81 mg Oral Daily  . atorvastatin  20 mg Oral q1800  . chlorhexidine  15 mL Mouth Rinse BID  . furosemide  40 mg Intravenous BID  . heparin subcutaneous  5,000 Units Subcutaneous Q8H  . insulin aspart  0-20 Units Subcutaneous TID WC  . levofloxacin (LEVAQUIN) IV  500 mg Intravenous Q24H  . vancomycin  1,250 mg Intravenous Q12H      Filed Vitals:   01/19/13 0400 01/19/13 0500 01/19/13 0600 01/19/13 0700  BP: 141/71  130/61   Pulse: 85 85 90 86  Temp: 98.3 F (36.8 C)     TempSrc: Oral     Resp: 23 24 24 24   Height:      Weight:  307 lb 5.1 oz (139.4 kg)    SpO2: 91% 90% 91% 93%    Intake/Output Summary (Last 24 hours) at 01/19/13 0742 Last data filed at 01/19/13 0600  Gross per 24 hour  Intake 2104.5 ml  Output   6100 ml  Net -3995.5 ml    LABS: Basic Metabolic Panel:  Recent Labs  82/95/62 0520 01/19/13 0535  NA 137 136  K 3.9 5.4*  CL 93* 92*  CO2 35* 35*  GLUCOSE 136* 162*  BUN 14 21  CREATININE 1.28 1.60*  CALCIUM 8.2* 9.0   Liver Function Tests:  Recent Labs  01/17/13 1615  AST 10  ALT 12  ALKPHOS 91  BILITOT 0.3  PROT 6.1  ALBUMIN 2.8*   No results found for this basename: LIPASE, AMYLASE,  in the last 72 hours CBC:  Recent Labs  01/17/13 1615 01/18/13 0520 01/19/13 0535  WBC 11.0* 9.7 12.6*  NEUTROABS 9.0*  --   --   HGB 14.5 14.1 15.5  HCT 44.5 43.9 47.3  MCV 81.5 80.7 80.4  PLT 213 189 207   Cardiac Enzymes: No results found for this basename: CKTOTAL, CKMB, CKMBINDEX, TROPONINI,  in the last 72 hours BNP: No components found with this basename:  POCBNP,  D-Dimer: No results found for this basename: DDIMER,  in the last 72 hours Hemoglobin A1C: No results found for this basename: HGBA1C,  in the last 72 hours Fasting Lipid Panel: No results found for this basename: CHOL, HDL, LDLCALC, TRIG, CHOLHDL, LDLDIRECT,  in the last 72 hours Thyroid Function Tests: No results found for this basename: TSH, T4TOTAL, FREET3, T3FREE, THYROIDAB,  in the last 72 hours Anemia Panel: No results found for this basename: VITAMINB12, FOLATE, FERRITIN, TIBC, IRON, RETICCTPCT,  in the last 72 hours  RADIOLOGY: Dg Chest Port 1 View  01/17/2013   *RADIOLOGY REPORT*  Clinical Data: Left lower lobe infiltrate.  Intubation.  PORTABLE CHEST - 1 VIEW  Comparison: One-view chest 01/17/2013.  Findings: The patient is now intubated.  Endotracheal tube terminates 4.5 cm above the carina, at the level of the clavicles. Cardiac enlargement is stable.  Moderate pulmonary vascular congestion has increased slightly.  Bibasilar airspace disease is evident, worse on the left.  A small left pleural effusion is suspected.  IMPRESSION:  1.  Satisfactory  positioning of the endotracheal tube. 2.  Stable cardiomegaly with increasing pulmonary vascular congestion, concerning for congestive heart failure. 3.  Bibasilar airspace disease likely reflects atelectasis, infection is not excluded. 4.  Probable small left pleural effusion.   Original Report Authenticated By: Marin Roberts, M.D.    PHYSICAL EXAM General: NAD Neuro: A & O x 3 Neck: JVP 10 cm, no thyromegaly or thyroid nodule.  Lungs: Clear anteriorly CV: Nondisplaced PMI.  Heart regular S1/S2, no S3/S4, no murmur.  1+ chronic edema to knees bilaterally.   Abdomen: Soft, obese, no distention.  Extremities: No clubbing or cyanosis.   TELEMETRY: Reviewed telemetry pt in NSR  ASSESSMENT AND PLAN: 47 yo with h/o CAD s/p CABG, DM, and ?COPD presented with dyspnea to [facility], ended up intubated due to significant  respiratory distress.  1. Pulmonary: Respiratory failure.  ? Primary etiology.  He is certainly volume overloaded on exam with elevated JVP despite BNP not being particularly high.  He has bibasilar infiltrates (PNA versus atelectasis).  PCT is low.  I suspect infection is not the primary driver here.  May be diastolic CHF/right heart failure in the setting of significant ?COPD and ?OHS/OSA. He was extubated yesterday but is still requiring oxygen by facemask this morning.  He denies use of CPAP or home oxygen at home, suspect he may need both.   2. 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  Echo at [facility] with EF 55-60% and dilated/dysfunctional RV with moderate-severe pulmonary HTN.  RV findings raise concern for OHS/OSA.  He diuresed well yesterday and weight is down 4 lbs but remains volume overloaded this morning on exam.  Unable to get PICC yesterday to follow CVP.  Creatinine has risen to 1.6 and K is elevated this morning (got K repletion yesterday).  - Decrease Lasix to bid given rapid diuresis and creatinine rise.  Will try to be more gentle but think he still needs some fluid off.    - If diuresis poor/creatinine continues to rise, would consider addition of milrinone to support RV/lower PA pressure => will need RHC to help guide this.  Would consider tomorrow depending on how he is doing (will keep NPO at midnight).  3. Abnormal aortic valve: Question of old vegetation.  He is afebrile.  During stay will need TEE. Do not think he would tolerate it right now given current oxygen requirement.   4. CAD: Doubt ACS.  Continue statin, ASA.   5. AKI: Creatinine up to 1.6 with diuresis and K up with repletion.  As above, decrease Lasix.  Will repeat BMET this afternoon to follow K.   Marca Ancona 01/19/2013 7:42 AM

## 2013-01-19 NOTE — Consult Note (Signed)
WOC consult Note Reason for Consult:Bilateral LEs with edema, erythema, and two ulcerations Wound type:venous insufficiency vs infectious Pressure Ulcer POA: No Measurement: bilateral LEs with edema, warmth and erythema, Right > left.  Right has medial malleolar deflated/reabsorbing  blister measuring 3cm x 2cm and another deflated/reabsorbing blister on the anterior aspect of the RLE measuring 2cm x 1cm.  Small amount of serous exudate from these two sites on old dressing.  Also noted are diffuse scabbed areas over bilateral LEs: patient says he has no symptoms of itching, of being bitten, etc. Wound bed:As described above. Drainage (amount, consistency, odor) As described above. Periwound: As described above Dressing procedure/placement/frequency: Along with LE elevation and flotation of heels, I will add a topical cream (1:1 triamcinolone in Eucerin cream) to be applied twice daily x 5 days to moisturize and treat what appears to be venous insufficiency in conjunction with LE bites. If no improvement in 5 days, recommend dermatology referral.  Moisturizing the LEs will keep them softer and more supple and better able to respond to increases in fluid/edema rather than experience blister formation. I do not think that compression is indicated at this time due to the inflammatory process underway, but it may be a future consideration of the PCP. I will not follow, but will remain available to this patient and his medical and nursing staff.  Please re-consult if needed. Thanks, Ladona Mow, MSN, RN, GNP, [place], CWON-AP [phone])

## 2013-01-20 ENCOUNTER — Encounter (HOSPITAL_COMMUNITY)
Admission: AD | Disposition: A | Discharge status: Home / Self Care | Payer: Self-pay | Source: Other Acute Inpatient Hospital | Attending: Cardiology

## 2013-01-20 ENCOUNTER — Inpatient Hospital Stay (HOSPITAL_COMMUNITY): Payer: Medicaid - Out of State

## 2013-01-20 DIAGNOSIS — I279 Pulmonary heart disease, unspecified: Secondary | ICD-10-CM

## 2013-01-20 DIAGNOSIS — I509 Heart failure, unspecified: Secondary | ICD-10-CM

## 2013-01-20 HISTORY — PX: RIGHT HEART CATHETERIZATION: SHX5447

## 2013-01-20 LAB — POCT I-STAT 3, VENOUS BLOOD GAS (G3P V)
Bicarbonate: 36.2 mEq/L — ABNORMAL HIGH (ref 20.0–24.0)
O2 Saturation: 64 %
TCO2: 39 mmol/L (ref 0–100)
pCO2, Ven: 86.9 mmHg (ref 45.0–50.0)
pH, Ven: 7.227 — ABNORMAL LOW (ref 7.250–7.300)
pO2, Ven: 42 mmHg (ref 30.0–45.0)

## 2013-01-20 LAB — GLUCOSE, CAPILLARY
Glucose-Capillary: 200 mg/dL — ABNORMAL HIGH (ref 70–99)
Glucose-Capillary: 126 mg/dL — ABNORMAL HIGH (ref 70–99)

## 2013-01-20 LAB — CBC
MCH: 26.1 pg (ref 26.0–34.0)
MCHC: 32.4 g/dL (ref 30.0–36.0)
Platelets: 180 10*3/uL (ref 150–400)
RBC: 5.63 MIL/uL (ref 4.22–5.81)

## 2013-01-20 LAB — BASIC METABOLIC PANEL
BUN: 29 mg/dL — ABNORMAL HIGH (ref 6–23)
CO2: 35 mEq/L — ABNORMAL HIGH (ref 19–32)
Chloride: 91 mEq/L — ABNORMAL LOW (ref 96–112)
Creatinine, Ser: 1.68 mg/dL — ABNORMAL HIGH (ref 0.50–1.35)
GFR calc Af Amer: 56 mL/min — ABNORMAL LOW (ref >90)
Glucose, Bld: 192 mg/dL — ABNORMAL HIGH (ref 70–99)
Potassium: 4.6 mEq/L (ref 3.5–5.1)

## 2013-01-20 SURGERY — RIGHT HEART CATH
Anesthesia: Moderate Sedation

## 2013-01-20 SURGERY — RIGHT HEART CATH
Anesthesia: LOCAL

## 2013-01-20 MED ORDER — SODIUM CHLORIDE 0.9 % IV SOLN: 250.0000 mL | INTRAVENOUS | Status: DC | PRN

## 2013-01-20 MED ORDER — SODIUM CHLORIDE 0.9 % IJ SOLN: 3.0000 mL | Freq: Two times a day (BID) | INTRAMUSCULAR | Status: DC

## 2013-01-20 MED ORDER — SODIUM CHLORIDE 0.9 % IJ SOLN
3.0000 mL | Freq: Two times a day (BID) | INTRAMUSCULAR | Status: DC
  Administered 2013-01-21 – 2013-01-25 (×7): 3 mL via INTRAVENOUS

## 2013-01-20 MED ORDER — HEPARIN (PORCINE) IN NACL 2-0.9 UNIT/ML-% IJ SOLN
INTRAMUSCULAR | Status: AC
  Filled 2013-01-20: qty 1000

## 2013-01-20 MED ORDER — SODIUM CHLORIDE 0.9 % IJ SOLN: 3.0000 mL | INTRAMUSCULAR | Status: DC | PRN

## 2013-01-20 MED ORDER — ACETAMINOPHEN 325 MG PO TABS: 650.0000 mg | ORAL_TABLET | ORAL | Status: DC | PRN

## 2013-01-20 MED ORDER — OXYCODONE-ACETAMINOPHEN 5-325 MG PO TABS
1.0000 | ORAL_TABLET | ORAL | Status: DC | PRN
  Administered 2013-01-27 – 2013-01-28 (×2): 2 via ORAL
  Filled 2013-01-20 (×2): qty 2

## 2013-01-20 MED ORDER — ASPIRIN 81 MG PO CHEW: 324.0000 mg | CHEWABLE_TABLET | ORAL | Status: DC

## 2013-01-20 MED ORDER — ACETAMINOPHEN 325 MG PO TABS
650.0000 mg | ORAL_TABLET | Freq: Four times a day (QID) | ORAL | Status: DC | PRN
  Administered 2013-01-20 – 2013-01-29 (×3): 650 mg via ORAL
  Filled 2013-01-20 (×4): qty 2

## 2013-01-20 MED ORDER — LIDOCAINE HCL (PF) 1 % IJ SOLN
INTRAMUSCULAR | Status: AC
  Filled 2013-01-20: qty 30

## 2013-01-20 MED ORDER — HEPARIN SODIUM (PORCINE) 5000 UNIT/ML IJ SOLN: 5000.0000 [IU] | Freq: Three times a day (TID) | INTRAMUSCULAR | Status: DC

## 2013-01-20 MED ORDER — ONDANSETRON HCL 4 MG/2ML IJ SOLN: 4.0000 mg | Freq: Four times a day (QID) | INTRAMUSCULAR | Status: DC | PRN

## 2013-01-20 NOTE — Progress Notes (Signed)
Inpatient Diabetes Program Recommendations  AACE/ADA: New Consensus Statement on Inpatient Glycemic Control (2013)  Target Ranges:  Prepandial:   less than 140 mg/dL      Peak postprandial:   less than 180 mg/dL (1-2 hours)      Critically ill patients:  140 - 180 mg/dL   Inpatient Diabetes Program Recommendations HgbA1C: order to assess prehospital glucose control  Thank you  Marianna Cid BSN, RN,CDE Inpatient Diabetes Coordinator [phone] (team pager)  

## 2013-01-20 NOTE — Progress Notes (Signed)
eLink Physician-Brief Progress Note Patient Name: Cesar Cantrell DOB: 1965-11-04 MRN: [id number]  Date of Service  01/20/2013   HPI/Events of Note   Called for HA  eICU Interventions  Tylenol prn ordered   Intervention Category Minor Interventions: Routine modifications to care plan (e.g. PRN medications for pain, fever)  BYRUM,ROBERT S. 01/20/2013, 12:45 AM

## 2013-01-20 NOTE — CV Procedure (Signed)
Right Heart Catheterization:  Indication:  CHF and OSA  Procedure:  Standard right heart catheterization was done from the right femoral vein using a 7Fr sheath and balloon flotation catheter.    Mean RA: 17  mmHg RV: 87/9 mmHg PA: 84/41 mmHg with a mean of 57 mmHg PCWP: 25  mmHg  Fick Cardiac Output:  5.99 L/minute,  2.43  L/minute/M2  Impression:  Primarily obesity hypoventilation syndrome Discussed with Dr Shirlee Latch Does not need an inotrope.  Continue CPAP and he will consider Vasodilator for pulmonary hypertension

## 2013-01-20 NOTE — Interval H&P Note (Signed)
History and Physical Interval Note:  01/20/2013 12:51 PM  Colon Branch  has presented today for surgery, with the diagnosis of pulmonary hypertension  The various methods of treatment have been discussed with the patient and family. After consideration of risks, benefits and other options for treatment, the patient has consented to  Procedure(s): RIGHT HEART CATH (N/A) as a surgical intervention .  The patient's history has been reviewed, patient examined, no change in status, stable for surgery.  I have reviewed the patient's chart and labs.  Questions were answered to the patient's satisfaction.     Cesar Cantrell

## 2013-01-20 NOTE — Progress Notes (Signed)
NUTRITION FOLLOW UP  Intervention:   1. No nutrition interventions at this time.   Nutrition Dx:   Inadequate oral intake related to inability to eat now evidenced by NPO diet status. Ongoing  Goal:   Enteral nutrition goal no longer applicable.  New Goal: Diet advance post op to meet >/=90% estimated nutrition needs.   Monitor:   Po intake, weight trends, I/O's, labs   Assessment:   Pt was extubated on 6/14. Diet advanced, ate 100% of one documented meal. Now NPO for possible procedure.  Diuresing, down 8 lbs from admission. Possible RHC today per Cards.   Pt reports good appetite PTA, no recent weight loss. Wants to eat something now.   Height: Ht Readings from Last 1 Encounters:  01/17/13 5\' 9"  (1.753 m)    Weight Status:   Wt Readings from Last 1 Encounters:  01/20/13 303 lb 9.2 oz (137.7 kg)    Re-estimated needs:  Kcal: 2200-2400 Protein: 90-100 gm  Fluid: >/= 1.5 L   Skin: WOC notes reviewed, wounds to BLE related to venous insufficiency and bites.   Diet Order: Carb Control   Intake/Output Summary (Last 24 hours) at 01/20/13 1012 Last data filed at 01/20/13 0900  Gross per 24 hour  Intake    770 ml  Output   2850 ml  Net  -2080 ml    Last BM: PTA   Labs:   Recent Labs Lab 01/19/13 0753 01/19/13 1448 01/20/13 0430  NA 137 136 134*  K 5.4* 4.7 4.6  CL 92* 92* 91*  CO2 36* 37* 35*  BUN 21 25* 29*  CREATININE 1.67* 1.71* 1.68*  CALCIUM 8.7 8.7 9.3  GLUCOSE 172* 241* 192*    CBG (last 3)   Recent Labs  01/19/13 1658 01/19/13 2149 01/20/13 0828  GLUCAP 183* 213* 184*    Scheduled Meds: . albuterol  2.5 mg Nebulization Q6H  . antiseptic oral rinse  15 mL Mouth Rinse BID  . aspirin EC  81 mg Oral Daily  . atorvastatin  20 mg Oral q1800  . furosemide  40 mg Intravenous BID  . heparin subcutaneous  5,000 Units Subcutaneous Q8H  . insulin aspart  0-20 Units Subcutaneous TID WC  . ipratropium  0.5 mg Nebulization Q6H  . levofloxacin   500 mg Oral Daily  . triamcinolone 0.1 % cream : eucerin  1 application Topical BID    Continuous Infusions: . sodium chloride 10 mL/hr at 01/20/13 0700    Clarene Duke RD, LDN Pager [phone] After Hours pager [phone]

## 2013-01-20 NOTE — Progress Notes (Signed)
Patient ID: Cesar Cantrell, male   DOB: 1966-05-12, 47 y.o.   MRN: [id number]    SUBJECTIVE: He continues to diurese well, weight is down.  Creatinine stably elevated.  He continues to require oxygen by facemask with oxygen saturation upper 80s/low 90s.   Marland Kitchen albuterol  2.5 mg Nebulization Q6H  . antiseptic oral rinse  15 mL Mouth Rinse BID  . aspirin EC  81 mg Oral Daily  . atorvastatin  20 mg Oral q1800  . furosemide  40 mg Intravenous BID  . heparin subcutaneous  5,000 Units Subcutaneous Q8H  . insulin aspart  0-20 Units Subcutaneous TID WC  . ipratropium  0.5 mg Nebulization Q6H  . levofloxacin  500 mg Oral Daily  . triamcinolone 0.1 % cream : eucerin  1 application Topical BID      Filed Vitals:   01/20/13 0630 01/20/13 0700 01/20/13 0817 01/20/13 0824  BP:    150/73  Pulse: 83 83  83  Temp:    98.4 F (36.9 C)  TempSrc:    Oral  Resp: 27 26  27   Height:      Weight:      SpO2: 93% 94% 88%     Intake/Output Summary (Last 24 hours) at 01/20/13 1053 Last data filed at 01/20/13 0900  Gross per 24 hour  Intake    770 ml  Output   2850 ml  Net  -2080 ml    LABS: Basic Metabolic Panel:  Recent Labs  40/98/11 1448 01/20/13 0430  NA 136 134*  K 4.7 4.6  CL 92* 91*  CO2 37* 35*  GLUCOSE 241* 192*  BUN 25* 29*  CREATININE 1.71* 1.68*  CALCIUM 8.7 9.3   Liver Function Tests:  Recent Labs  01/17/13 1615  AST 10  ALT 12  ALKPHOS 91  BILITOT 0.3  PROT 6.1  ALBUMIN 2.8*   No results found for this basename: LIPASE, AMYLASE,  in the last 72 hours CBC:  Recent Labs  01/17/13 1615 01/18/13 0520 01/19/13 0535  WBC 11.0* 9.7 12.6*  NEUTROABS 9.0*  --   --   HGB 14.5 14.1 15.5  HCT 44.5 43.9 47.3  MCV 81.5 80.7 80.4  PLT 213 189 207   Cardiac Enzymes: No results found for this basename: CKTOTAL, CKMB, CKMBINDEX, TROPONINI,  in the last 72 hours BNP: No components found with this basename: POCBNP,  D-Dimer: No results found for this basename:  DDIMER,  in the last 72 hours Hemoglobin A1C: No results found for this basename: HGBA1C,  in the last 72 hours Fasting Lipid Panel: No results found for this basename: CHOL, HDL, LDLCALC, TRIG, CHOLHDL, LDLDIRECT,  in the last 72 hours Thyroid Function Tests: No results found for this basename: TSH, T4TOTAL, FREET3, T3FREE, THYROIDAB,  in the last 72 hours Anemia Panel: No results found for this basename: VITAMINB12, FOLATE, FERRITIN, TIBC, IRON, RETICCTPCT,  in the last 72 hours  RADIOLOGY: Dg Chest Port 1 View  01/17/2013   *RADIOLOGY REPORT*  Clinical Data: Left lower lobe infiltrate.  Intubation.  PORTABLE CHEST - 1 VIEW  Comparison: One-view chest 01/17/2013.  Findings: The patient is now intubated.  Endotracheal tube terminates 4.5 cm above the carina, at the level of the clavicles. Cardiac enlargement is stable.  Moderate pulmonary vascular congestion has increased slightly.  Bibasilar airspace disease is evident, worse on the left.  A small left pleural effusion is suspected.  IMPRESSION:  1.  Satisfactory positioning of the endotracheal tube.  2.  Stable cardiomegaly with increasing pulmonary vascular congestion, concerning for congestive heart failure. 3.  Bibasilar airspace disease likely reflects atelectasis, infection is not excluded. 4.  Probable small left pleural effusion.   Original Report Authenticated By: Marin Roberts, M.D.    PHYSICAL EXAM General: NAD Neuro: A & O x 3 Neck: JVP 10 cm, no thyromegaly or thyroid nodule.  Lungs: Clear anteriorly CV: Nondisplaced PMI.  Heart regular S1/S2, no S3/S4, no murmur.  1+ chronic edema to knees bilaterally.   Abdomen: Soft, obese, no distention.  Extremities: No clubbing or cyanosis.   TELEMETRY: Reviewed telemetry pt in NSR  ASSESSMENT AND PLAN: 47 yo with h/o CAD s/p CABG, DM, and suspected OHS/OSA and suspected COPD presented with dyspnea to [facility], ended up intubated due to significant respiratory distress.  1.  Pulmonary: Respiratory failure.  ? Primary etiology.  He is certainly volume overloaded on exam with elevated JVP despite BNP not being particularly high.  He has bibasilar infiltrates (PNA versus atelectasis).  PCT is low.  I suspect infection is not the primary driver here.  May be diastolic CHF/right heart failure in the setting of significant ?COPD and decompensated OHS/OSA. He was extubated Saturday but is still requiring oxygen by facemask this morning.  He denies use of CPAP or home oxygen at home, suspect he may need both.   2. 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  Echo at [facility] with EF 55-60% and dilated/dysfunctional RV with moderate-severe pulmonary HTN.  RV findings raise concern for OHS/OSA.  He diuresed well yesterday and weight is down another 4 lbs but remains volume overloaded this morning on exam.  Creatinine stable at 1.6.  - Continue current IV Lasix.  - RHC today for filling pressures and PA pressure.  He is able to lie mostly flat wearing oxygen.  - If diuresis poor/creatinine continues to rise, would consider addition of milrinone to support RV/lower PA pressure  3. Abnormal aortic valve: Question of old vegetation.  He is afebrile.  During stay will need TEE. Do not think he would tolerate it right now given current oxygen requirement.   4. CAD: Doubt ACS.  Continue statin, ASA.   5. AKI: Creatinine up to 1.6 with diuresis but stable.  Continue current IV Lasix, may adjust based on RHC.    Marca Ancona 01/20/2013 10:53 AM

## 2013-01-20 NOTE — Progress Notes (Signed)
PULMONARY  / CRITICAL CARE MEDICINE  Name: Cesar Cantrell MRN: [id number] DOB: 10/13/1965    ADMISSION DATE:  01/17/2013 CONSULTATION DATE:  01/17/13  REFERRING MD :  Dr. Diona Browner PRIMARY SERVICE:  Cardiology  CHIEF COMPLAINT:  Dyspnea       BRIEF PATIENT DESCRIPTION:  47 y.o. Obese white male, smoker with a history of inferior MI, 5 vessel CABG, type II DM, and hypertension presents from [facility] where he was found unresponsive with acute on chronic hypercarbic & hypoxic resp failure.  PCCM asked to consult on ventilatory support and pulmonary aspects of case.         SIGNIFICANT EVENTS / STUDIES:  6/13 Admitted in transfer from [facility] to Cards service 6/13 Echo: LVEF 55-60%, mild LA enlargement, mod RA enlargement, mild to mod RV dilatation, estimated RVSP 62 mmHg   LINES / TUBES: Nasal ETT >> 6/14 RUE PICC 6/15 >>   CULTURES: 6/13 Strep Antigen >> NEG 6/13 Blood >>NGTD 6/13 Sputum >>NGTD    ANTIBIOTICS: 6/13 Vanc >>> 6/15 6/13 Levaquin >> 6/15 changed to PO >>    SUBJECTIVE:  Patient states he is doing well .  As per nursing was weaned down to 5 L nasal canula yesterday with reasonable O2 sats. Denies chest pain, palpitations, dizziness, vision changes.   VITAL SIGNS: Temp:  [98.1 F (36.7 C)-99.4 F (37.4 C)] 98.1 F (36.7 C) (06/16 0400) Pulse Rate:  [79-99] 83 (06/16 0700) Resp:  [18-33] 26 (06/16 0700) BP: (116-145)/(47-74) 129/66 mmHg (06/16 0400) SpO2:  [83 %-96 %] 94 % (06/16 0700) FiO2 (%):  [50 %] 50 % (06/16 0630) Weight:  [137.7 kg (303 lb 9.2 oz)] 137.7 kg (303 lb 9.2 oz) (06/16 0620)  PHYSICAL EXAMINATION: General:  Obese white male Neuro:  No focal deficits HEENT:  NCAT, PERLA, EOM intact Cardiovascular: RRR s M Lungs: No wheezes, generalized rhonchi.  Diminished breath sounds at bases bilaterally  Abdomen:  Obese, soft, NT Ext: severe LE edema, legs dressed in gauze bilaterally.   Recent Labs Lab 01/19/13 0753 01/19/13 1448  01/20/13 0430  NA 137 136 134*  K 5.4* 4.7 4.6  CL 92* 92* 91*  CO2 36* 37* 35*  BUN 21 25* 29*  CREATININE 1.67* 1.71* 1.68*  GLUCOSE 172* 241* 192*    Recent Labs Lab 01/17/13 1615 01/18/13 0520 01/19/13 0535  HGB 14.5 14.1 15.5  HCT 44.5 43.9 47.3  WBC 11.0* 9.7 12.6*  PLT 213 189 207    ABG    Component Value Date/Time   PHART 7.368 01/18/2013 0336   PCO2ART 62.4* 01/18/2013 0336   PO2ART 64.5* 01/18/2013 0336   HCO3 35.1* 01/18/2013 0336   TCO2 37.0 01/18/2013 0336   O2SAT 92.2 01/18/2013 0336     CXR: 6/16 - Diminished lung volumes bilaterally.  ? Atelectasis bilateral bases      ASSESSMENT / PLAN:  PULMONARY A:  Acute on chronic respiratory failure Suspect decompensated OHS History compatible with OSA - not formally diagnosed PAH by Echo 6/13 (@ [facility] - report in shadow chart) Smoker Hypoxia - ? Combination of atelectasis & ? Obstructive lung disease P:  Nebulized BDs Cont supplemental O2, wean to nasal canula as tolerated -aim for satn 88% Cont incentive spirometry Counseled re: smoking cessation Will need formal sleep eval & likely noct CPAP on discharge - his PCP is at [facility] Will likely need home O2  CARDIOVASCULAR A:  CAD H/O CHF Hyperlipidemia Cor pulmonale Abnormal aortic valve: Question of old vegetation  P:  -Mgmt per Cards - ? TEE at some point   RENAL A:  Hypervolemia Met alkalosis due to loop diuresis P:   Back off on Diuresis  Due to rising cr   GASTROINTESTINAL A:  Obesity P:   CHO mod diet    HEMATOLOGIC A:  No active issues P:  Monitor intermittently  INFECTIOUS A:   -LE Cellulitis/chronic venous stasis ulcers -Doubt CAP  P:   Micro and abx as above   ENDOCRINE A:  Type II DM   P:   -Cont SSI  NEUROLOGIC A:  No active issues P:   -continue to monitor closely  Patient awaiting bed in SDU. Continue to wean oxygen to nasal canula as tolerated.  Terri Piedra [place] Physician Assistant  Student  Care during the described time interval was provided by me and/or other providers on the critical care team.  I have reviewed this patient's available data, including medical history, events of note, physical examination and test results as part of my evaluation   Cyril Mourning MD. FCCP. [facility] Pager [phone] If no response call [phone]   01/20/13, 8:24

## 2013-01-21 LAB — CBC
Platelets: 190 10*3/uL (ref 150–400)
RDW: 14.7 % (ref 11.5–15.5)
WBC: 11.2 10*3/uL — ABNORMAL HIGH (ref 4.0–10.5)

## 2013-01-21 LAB — GLUCOSE, CAPILLARY
Glucose-Capillary: 173 mg/dL — ABNORMAL HIGH (ref 70–99)
Glucose-Capillary: 234 mg/dL — ABNORMAL HIGH (ref 70–99)

## 2013-01-21 LAB — BASIC METABOLIC PANEL
Calcium: 9.3 mg/dL (ref 8.4–10.5)
Creatinine, Ser: 1.59 mg/dL — ABNORMAL HIGH (ref 0.50–1.35)
GFR calc Af Amer: 59 mL/min — ABNORMAL LOW (ref >90)
Sodium: 134 mEq/L — ABNORMAL LOW (ref 135–145)

## 2013-01-21 LAB — CULTURE, RESPIRATORY W GRAM STAIN
Culture: NO GROWTH
Special Requests: NORMAL

## 2013-01-21 MED ORDER — INSULIN ASPART 100 UNIT/ML ~~LOC~~ SOLN
0.0000 [IU] | Freq: Every day | SUBCUTANEOUS | Status: DC
  Administered 2013-01-21 – 2013-01-27 (×4): 2 [IU] via SUBCUTANEOUS

## 2013-01-21 NOTE — Progress Notes (Signed)
Placed pt. On BIPAP 10/5 via [facility]FM with 6L O2 bled in. Pt. Is tolerating BIPAP well at this time. All vitals are within normal limits.

## 2013-01-21 NOTE — Progress Notes (Signed)
Patient ID: JOBANY MONTELLANO, male   DOB: 09/19/1965, 47 y.o.   MRN: [id number]    SUBJECTIVE: No complaints Was not wearing oxygen or CPAP at home  . albuterol  2.5 mg Nebulization Q6H  . antiseptic oral rinse  15 mL Mouth Rinse BID  . aspirin EC  81 mg Oral Daily  . atorvastatin  20 mg Oral q1800  . furosemide  40 mg Intravenous BID  . heparin subcutaneous  5,000 Units Subcutaneous Q8H  . insulin aspart  0-20 Units Subcutaneous TID WC  . ipratropium  0.5 mg Nebulization Q6H  . levofloxacin  500 mg Oral Daily  . triamcinolone 0.1 % cream : eucerin  1 application Topical BID      Filed Vitals:   01/20/13 0630 01/20/13 0700 01/20/13 0817 01/20/13 0824  BP:    150/73  Pulse: 83 83  83  Temp:    98.4 F (36.9 C)  TempSrc:    Oral  Resp: 27 26  27   Height:      Weight:      SpO2: 93% 94% 88%     Intake/Output Summary (Last 24 hours) at 01/20/13 1053 Last data filed at 01/20/13 0900  Gross per 24 hour  Intake    770 ml  Output   2850 ml  Net  -2080 ml    LABS: Basic Metabolic Panel:  Recent Labs  40/98/11 1448 01/20/13 0430  NA 136 134*  K 4.7 4.6  CL 92* 91*  CO2 37* 35*  GLUCOSE 241* 192*  BUN 25* 29*  CREATININE 1.71* 1.68*  CALCIUM 8.7 9.3   Liver Function Tests:  Recent Labs  01/17/13 1615  AST 10  ALT 12  ALKPHOS 91  BILITOT 0.3  PROT 6.1  ALBUMIN 2.8*   No results found for this basename: LIPASE, AMYLASE,  in the last 72 hours CBC:  Recent Labs  01/17/13 1615 01/18/13 0520 01/19/13 0535  WBC 11.0* 9.7 12.6*  NEUTROABS 9.0*  --   --   HGB 14.5 14.1 15.5  HCT 44.5 43.9 47.3  MCV 81.5 80.7 80.4  PLT 213 189 207   Cardiac Enzymes: No results found for this basename: CKTOTAL, CKMB, CKMBINDEX, TROPONINI,  in the last 72 hours BNP: No components found with this basename: POCBNP,  D-Dimer: No results found for this basename: DDIMER,  in the last 72 hours Hemoglobin A1C: No results found for this basename: HGBA1C,  in the last 72  hours Fasting Lipid Panel: No results found for this basename: CHOL, HDL, LDLCALC, TRIG, CHOLHDL, LDLDIRECT,  in the last 72 hours Thyroid Function Tests: No results found for this basename: TSH, T4TOTAL, FREET3, T3FREE, THYROIDAB,  in the last 72 hours Anemia Panel: No results found for this basename: VITAMINB12, FOLATE, FERRITIN, TIBC, IRON, RETICCTPCT,  in the last 72 hours  RADIOLOGY: Dg Chest Port 1 View  01/17/2013   *RADIOLOGY REPORT*  Clinical Data: Left lower lobe infiltrate.  Intubation.  PORTABLE CHEST - 1 VIEW  Comparison: One-view chest 01/17/2013.  Findings: The patient is now intubated.  Endotracheal tube terminates 4.5 cm above the carina, at the level of the clavicles. Cardiac enlargement is stable.  Moderate pulmonary vascular congestion has increased slightly.  Bibasilar airspace disease is evident, worse on the left.  A small left pleural effusion is suspected.  IMPRESSION:  1.  Satisfactory positioning of the endotracheal tube. 2.  Stable cardiomegaly with increasing pulmonary vascular congestion, concerning for congestive heart failure. 3.  Bibasilar  airspace disease likely reflects atelectasis, infection is not excluded. 4.  Probable small left pleural effusion.   Original Report Authenticated By: Marin Roberts, M.D.    PHYSICAL EXAM General: NAD Neuro: A & O x 3 Neck: JVP 10 cm, no thyromegaly or thyroid nodule.  Lungs: Clear anteriorly CV: Nondisplaced PMI.  Heart regular S1/S2, no S3/S4, no murmur.  1+ chronic edema to knees bilaterally.   Abdomen: Soft, obese, no distention.  Extremities: No clubbing or cyanosis.   TELEMETRY: Reviewed telemetry pt in NSR  ASSESSMENT AND PLAN: OSA/Resp Failure :  Still with significant resting and exercise desats  CXR with bibasilar infiltrates.  Good diuresis over 2L with improves LE edema and venous stasis.  ABG with chronic 60/60 and  Right heart cath with PA pressure in 80's.  Have asked pulmonary to assume care and Dr  Vassie Loll graciously agreed.  He will need home CPAP and oxygen.  Continue pulmonary toilet And diuresis. No need of inotropes based on CO at cath.  Charlton Haws

## 2013-01-21 NOTE — Progress Notes (Signed)
PULMONARY  / CRITICAL CARE MEDICINE  Name: Cesar Cantrell MRN: [id number] DOB: September 19, 1965    ADMISSION DATE:  01/17/2013 CONSULTATION DATE:  01/17/13  REFERRING MD :  Dr. Diona Browner PRIMARY SERVICE:  Cardiology  CHIEF COMPLAINT:  Dyspnea       BRIEF PATIENT DESCRIPTION:  47 y.o. Obese white male, smoker with a history of inferior MI, 5 vessel CABG, type II DM, and hypertension presents from [facility] where he was found unresponsive with acute on chronic hypercarbic & hypoxic resp failure.  PCCM asked to consult on ventilatory support and pulmonary aspects of case.         SIGNIFICANT EVENTS / STUDIES:  6/13 Admitted in transfer from [facility] to Cards service 6/13 Echo: LVEF 55-60%, mild LA enlargement, mod RA enlargement, mild to mod RV dilatation, estimated RVSP 62 mmHg 6/16 RHC:  RV: 87/9 mm Hg, PA 84/81 mm Hg with mean 57 mm Hg, PCWP 25 mm Hg. 2.43 L/min/M2, severe pulmonary hypertension  LINES / TUBES: Nasal ETT >> 6/14 RUE PICC 6/15 >>   CULTURES: 6/13 Strep Antigen >> NEG 6/13 Blood >>NGTD 6/13 Sputum >>NGTD    ANTIBIOTICS: 6/13 Vanc >>> 6/15 6/13 Levaquin >> 6/15 changed to PO >>    SUBJECTIVE:  Patient states he is doing well.  Patient denies vision changes, chest pain, shortness of breath, HA, difficulty sleeping.  As per nursing desats very quickly with attempts to wean off VM mask. Remains on 50%     VITAL SIGNS: Temp:  [97.4 F (36.3 C)-98.6 F (37 C)] 98.1 F (36.7 C) (06/17 0740) Pulse Rate:  [85-105] 88 (06/17 0357) Resp:  [17-33] 31 (06/17 0740) BP: (138-174)/(56-134) 157/67 mmHg (06/17 0740) SpO2:  [84 %-94 %] 93 % (06/17 0740) FiO2 (%):  [35 %-50 %] 50 % (06/17 0357)  PHYSICAL EXAMINATION: General:  Obese white male, sleeping sitting up in bed Neuro:  No focal deficits HEENT:  NCAT, PERLA, EOM intact Cardiovascular: RRR s M Lungs: No wheezes or rhonchi.  Diminished breath sounds at bases bilaterally , rt basal crackles Abdomen:  Obese,  soft, NT Ext: 2+ LE edema, legs dressed in gauze bilaterally.   Recent Labs Lab 01/19/13 1448 01/20/13 0430 01/20/13 1700 01/21/13 0500  NA 136 134*  --  134*  K 4.7 4.6  --  4.8  CL 92* 91*  --  91*  CO2 37* 35*  --  37*  BUN 25* 29*  --  35*  CREATININE 1.71* 1.68* 1.63* 1.59*  GLUCOSE 241* 192*  --  189*    Recent Labs Lab 01/19/13 0535 01/20/13 1500 01/21/13 0500  HGB 15.5 14.7 15.4  HCT 47.3 45.4 47.0  WBC 12.6* 10.9* 11.2*  PLT 207 180 190    ABG    Component Value Date/Time   PHART 7.368 01/18/2013 0336   PCO2ART 62.4* 01/18/2013 0336   PO2ART 64.5* 01/18/2013 0336   HCO3 36.2* 01/20/2013 1343   TCO2 39 01/20/2013 1343   O2SAT 64.0 01/20/2013 1343     CXR: 6/17 - no new films 6/16 - Diminished lung volumes bilaterally.  ? Atelectasis bilateral bases.  Nothing new      ASSESSMENT / PLAN:  PULMONARY A:  Acute on chronic respiratory failure Severe Pulmonary Hypertension by RHC-6/17 Suspect decompensated OHS History compatible with OSA - not formally diagnosed PAH by Echo 6/13 (@ [facility] - report in shadow chart) Smoker Hypoxia - ? Combination of atelectasis & ? Obstructive lung disease P:  Nebulized BDs Cont  supplemental O2 decrease FiO2 to 40% on VM -aim for satn 88% Cont incentive spirometry Counseled re: smoking cessation Will need formal sleep eval & likely noct CPAP on discharge - his PCP is at [facility] Will likely need home O2  CARDIOVASCULAR A:  CAD H/O CHF Hyperlipidemia Cor pulmonale Abnormal aortic valve: Question of old vegetation Severe pulm hypertension - combination of hypoxia/OSA/ lung disease/ diastolic dysfunction rt heart cath 6/16 >> Mean RA: 17 mmHg  RV: 87/9 mmHg  PA: 84/41 mmHg with a mean of 57 mmHg  PCWP: 25 mmHg  Fick Cardiac Output: 5.99 L/minute, 2.43 L/minute/M2   P:  -Mgmt per Cards - ? TEE at some point -ct lasix 40 q 12h   RENAL A:  Hypervolemia Met alkalosis due to loop diuresis P:   -SCr down  from yesterday but still elevated above baseline, gentle diuresis only -follow SCr on Bmet   GASTROINTESTINAL A:  Obesity P:   CHO mod diet   HEMATOLOGIC A:  No active issues P:  Monitor intermittently  INFECTIOUS A:   -LE Cellulitis/chronic venous stasis ulcers -Doubt CAP  P:   -levaquin x 7ds   ENDOCRINE A:  Type II DM   P:   -Cont SSI  NEUROLOGIC A:  No active issues P:   -continue to monitor closely  Continue to wean supplemental O2.  Ok to keep VM for now.  Decrease FiO2 down to 40% with goal SpO2>88%.   Discharge endpoint when down to [place]  [facility] Physician Assistant Student 01/21/13, 9:46 am  Care during the described time interval was provided by me and/or other providers on the critical care team.  I have reviewed this patient's available data, including medical history, events of note, physical examination and test results as part of my evaluation  Discussed with cards  Cyril Mourning MD. FCCP. [facility] Pager [phone] If no response call [phone]

## 2013-01-22 ENCOUNTER — Inpatient Hospital Stay (HOSPITAL_COMMUNITY): Payer: Medicaid - Out of State

## 2013-01-22 LAB — RENAL FUNCTION PANEL
Albumin: 3.3 g/dL — ABNORMAL LOW (ref 3.5–5.2)
Calcium: 9.4 mg/dL (ref 8.4–10.5)
GFR calc Af Amer: 58 mL/min — ABNORMAL LOW (ref >90)
Glucose, Bld: 178 mg/dL — ABNORMAL HIGH (ref 70–99)
Phosphorus: 4.9 mg/dL — ABNORMAL HIGH (ref 2.3–4.6)
Potassium: 4.5 mEq/L (ref 3.5–5.1)
Sodium: 135 mEq/L (ref 135–145)

## 2013-01-22 LAB — POCT I-STAT 3, ART BLOOD GAS (G3+)
Bicarbonate: 40.3 mEq/L — ABNORMAL HIGH (ref 20.0–24.0)
Bicarbonate: 39.7 mEq/L — ABNORMAL HIGH (ref 20.0–24.0)
O2 Saturation: 99 %
O2 Saturation: 81 %
Patient temperature: 98.6
TCO2: 43 mmol/L (ref 0–100)
TCO2: 42 mmol/L (ref 0–100)
pCO2 arterial: 96.2 mmHg (ref 35.0–45.0)
pCO2 arterial: 92.6 mmHg (ref 35.0–45.0)
pH, Arterial: 7.23 — ABNORMAL LOW (ref 7.350–7.450)
pH, Arterial: 7.24 — ABNORMAL LOW (ref 7.350–7.450)

## 2013-01-22 LAB — GLUCOSE, CAPILLARY: Glucose-Capillary: 182 mg/dL — ABNORMAL HIGH (ref 70–99)

## 2013-01-22 LAB — HEMOGLOBIN A1C: Hgb A1c MFr Bld: 8.4 % — ABNORMAL HIGH (ref <5.7)

## 2013-01-22 NOTE — Progress Notes (Signed)
PULMONARY  / CRITICAL CARE MEDICINE  Name: Cesar Cantrell MRN: [id number] DOB: 1966-06-07    ADMISSION DATE:  01/17/2013 CONSULTATION DATE:  01/17/13  REFERRING MD :  Dr. Diona Browner PRIMARY SERVICE:  Cardiology  CHIEF COMPLAINT:  Dyspnea       BRIEF PATIENT DESCRIPTION:  47 y.o. Obese white male, smoker with a history of inferior MI, 5 vessel CABG, type II DM, and hypertension presents from [facility] where he was found unresponsive with acute on chronic hypercarbic & hypoxic resp failure.  PCCM asked to consult on ventilatory support and pulmonary aspects of case.         SIGNIFICANT EVENTS / STUDIES:  6/13 Admitted in transfer from [facility] to Cards service 6/13 Echo: LVEF 55-60%, mild LA enlargement, mod RA enlargement, mild to mod RV dilatation, estimated RVSP 62 mmHg 6/16 RHC:  RV: 87/9 mm Hg, PA 84/81 mm Hg with mean 57 mm Hg, PCWP 25 mm Hg. 2.43 L/min/M2, severe pulmonary hypertension  LINES / TUBES: Nasal ETT >> 6/14 RUE PICC 6/15 >>   CULTURES: 6/13 Strep Antigen >> NEG 6/13 Blood >>NGTD 6/13 Sputum >>NGTD    ANTIBIOTICS: 6/13 Vanc >>> 6/15 6/13 Levaquin >> 6/15 changed to PO >>    SUBJECTIVE:  Patient put back on BiPap yesterday, with an episode of desat on 40% FiO2 and worsening respiratory acidosis.  Patient states that he does not recall being placed on BiPap or having any difficulty breathing.   Sitting upright this am & able to sustain conversation, off bipap      VITAL SIGNS: Temp:  [97.6 F (36.4 C)-98.7 F (37.1 C)] 97.6 F (36.4 C) (06/18 0411) Pulse Rate:  [77-97] 95 (06/18 0746) Resp:  [17-31] 21 (06/18 0746) BP: (147-177)/(65-79) 167/79 mmHg (06/18 0746) SpO2:  [78 %-96 %] 93 % (06/18 0746) FiO2 (%):  [40 %-100 %] 40 % (06/18 0746)  PHYSICAL EXAMINATION: General:  Obese white male, NAD on BiPap Neuro:  A&O x 4, CN II-XII intact, no focal deficits, can follow commands appropriately HEENT:  NCAT, PERLA, EOM intact, MM moist Cardiovascular:  RRR s M Lungs: No wheezes or rhonchi.  Diminished breath sounds at bases bilaterally , rt basal crackles Abdomen:  Obese, soft, NT Ext: 2+ LE edema, legs dressed in gauze bilaterally.   Recent Labs Lab 01/20/13 0430 01/20/13 1700 01/21/13 0500 01/22/13 0500  NA 134*  --  134* 135  K 4.6  --  4.8 4.5  CL 91*  --  91* 91*  CO2 35*  --  37* 37*  BUN 29*  --  35* 40*  CREATININE 1.68* 1.63* 1.59* 1.63*  GLUCOSE 192*  --  189* 178*    Recent Labs Lab 01/19/13 0535 01/20/13 1500 01/21/13 0500  HGB 15.5 14.7 15.4  HCT 47.3 45.4 47.0  WBC 12.6* 10.9* 11.2*  PLT 207 180 190    ABG    Component Value Date/Time   PHART 7.240* 01/22/2013 0522   PCO2ART 92.6* 01/22/2013 0522   PO2ART 56.0* 01/22/2013 0522   HCO3 39.7* 01/22/2013 0522   TCO2 42 01/22/2013 0522   O2SAT 81.0 01/22/2013 0522     CXR: 6/18 - Bibasilar consolidations, left lower lobe ? Mild improvement.  Diminished lung volumes bilaterally.     ASSESSMENT / PLAN:  PULMONARY A:  Acute on chronic respiratory failure Respiratory Acidosis - 6/18 Severe Pulmonary Hypertension by RHC-6/17 Suspect decompensated OHS History compatible with OSA - not formally diagnosed PAH by Echo 6/13 (@ [facility] -  report in shadow chart) Smoker Hypoxia - ? Combination of atelectasis & ? Obstructive lung disease P:  Nebulized BDs [place] daytime, mandatory bipap nocturnal ABG only if mental status worse or at dc Cont incentive spirometry Counseled re: smoking cessation Will need formal sleep eval & likely noct biPAP on discharge - his PCP is at [facility] Will likely need home O2  CARDIOVASCULAR A:  CAD H/O CHF Hyperlipidemia Cor pulmonale Abnormal aortic valve: Question of old vegetation Severe pulm hypertension - combination of hypoxia/OSA/ lung disease/ diastolic dysfunction rt heart cath 6/16 >> Mean RA: 17 mmHg  RV: 87/9 mmHg  PA: 84/41 mmHg with a mean of 57 mmHg  PCWP: 25 mmHg  Fick Cardiac Output: 5.99 L/minute, 2.43  L/minute/M2   P:  -Mgmt per Cards - ? TEE at some point -ct lasix 40 q 12h - good diuresis   RENAL A:  Hypervolemia Hyperphosphatemia - 01/22/13 Met alkalosis due to loop diuresis P:   -SCr elevated above baseline, gentle diuresis only -follow SCr on Bmet -phosphate mildly elevated, likely due to diuresis, follow closely on Bmet   GASTROINTESTINAL A:  Obesity P:   CHO mod diet   HEMATOLOGIC A:  No active issues P:  Monitor intermittently  INFECTIOUS A:   -LE Cellulitis/chronic venous stasis ulcers -Doubt CAP  P:   -levaquin x 7ds   ENDOCRINE A:  Type II DM   P:   -Cont SSI  NEUROLOGIC A:  No active issues P:   -continue to monitor closely  Summary - unclear cause of decompensation overnight - ? VQ mismatch Diuresing well, mandatory BiPAP. Discharge endpoint when down to [place]  [facility] Physician Assistant Student 01/22/13, 8:30 am  Care during the described time interval was provided by me and/or other providers on the critical care team.  I have reviewed this patient's available data, including medical history, events of note, physical examination and test results as part of my evaluation  Anaiza Behrens V.  [phone]

## 2013-01-22 NOTE — Progress Notes (Signed)
Patient ID: Cesar Cantrell, male   DOB: Jun 14, 1966, 47 y.o.   MRN: [id number]   SUBJECTIVE: He continues to diurese well, weight is down another 4 lbs.  Creatinine stably elevated.  He continues to require oxygen by facemask and CPAP at night with oxygen saturation upper 80s/low 90s. PaCO2 93 on last ABG.  CVP 14.   RHC Mean RA: 17 mmHg  RV: 87/9 mmHg  PA: 84/41 mmHg with a mean of 57 mmHg  PCWP: 25 mmHg  Fick Cardiac Output: 5.99 L/minute, 2.43 L/minute/M2   . albuterol  2.5 mg Nebulization Q6H  . antiseptic oral rinse  15 mL Mouth Rinse BID  . aspirin EC  81 mg Oral Daily  . atorvastatin  20 mg Oral q1800  . furosemide  40 mg Intravenous BID  . heparin subcutaneous  5,000 Units Subcutaneous Q8H  . insulin aspart  0-20 Units Subcutaneous TID WC  . insulin aspart  0-5 Units Subcutaneous QHS  . ipratropium  0.5 mg Nebulization Q6H  . levofloxacin  500 mg Oral Daily  . sodium chloride  3 mL Intravenous Q12H  . triamcinolone 0.1 % cream : eucerin  1 application Topical BID      Filed Vitals:   01/22/13 0114 01/22/13 0134 01/22/13 0411 01/22/13 0430  BP:  153/73  150/65  Pulse:  78  77  Temp:   97.6 F (36.4 C)   TempSrc:   Axillary   Resp:  31  28  Height:      Weight:      SpO2: 89% 93%      Intake/Output Summary (Last 24 hours) at 01/22/13 0738 Last data filed at 01/22/13 0100  Gross per 24 hour  Intake    755 ml  Output   2825 ml  Net  -2070 ml    LABS: Basic Metabolic Panel:  Recent Labs  78/29/56 0500 01/22/13 0500  NA 134* 135  K 4.8 4.5  CL 91* 91*  CO2 37* 37*  GLUCOSE 189* 178*  BUN 35* 40*  CREATININE 1.59* 1.63*  CALCIUM 9.3 9.4  PHOS  --  4.9*   Liver Function Tests:  Recent Labs  01/22/13 0500  ALBUMIN 3.3*   No results found for this basename: LIPASE, AMYLASE,  in the last 72 hours CBC:  Recent Labs  01/20/13 1500 01/21/13 0500  WBC 10.9* 11.2*  HGB 14.7 15.4  HCT 45.4 47.0  MCV 80.6 80.3  PLT 180 190   Cardiac  Enzymes: No results found for this basename: CKTOTAL, CKMB, CKMBINDEX, TROPONINI,  in the last 72 hours BNP: No components found with this basename: POCBNP,  D-Dimer: No results found for this basename: DDIMER,  in the last 72 hours Hemoglobin A1C: No results found for this basename: HGBA1C,  in the last 72 hours Fasting Lipid Panel: No results found for this basename: CHOL, HDL, LDLCALC, TRIG, CHOLHDL, LDLDIRECT,  in the last 72 hours Thyroid Function Tests: No results found for this basename: TSH, T4TOTAL, FREET3, T3FREE, THYROIDAB,  in the last 72 hours Anemia Panel: No results found for this basename: VITAMINB12, FOLATE, FERRITIN, TIBC, IRON, RETICCTPCT,  in the last 72 hours  RADIOLOGY: Dg Chest Port 1 View  01/17/2013   *RADIOLOGY REPORT*  Clinical Data: Left lower lobe infiltrate.  Intubation.  PORTABLE CHEST - 1 VIEW  Comparison: One-view chest 01/17/2013.  Findings: The patient is now intubated.  Endotracheal tube terminates 4.5 cm above the carina, at the level of the clavicles. Cardiac  enlargement is stable.  Moderate pulmonary vascular congestion has increased slightly.  Bibasilar airspace disease is evident, worse on the left.  A small left pleural effusion is suspected.  IMPRESSION:  1.  Satisfactory positioning of the endotracheal tube. 2.  Stable cardiomegaly with increasing pulmonary vascular congestion, concerning for congestive heart failure. 3.  Bibasilar airspace disease likely reflects atelectasis, infection is not excluded. 4.  Probable small left pleural effusion.   Original Report Authenticated By: Marin Roberts, M.D.    PHYSICAL EXAM General: NAD Neuro: A & O x 3 Neck: JVP 10 cm, no thyromegaly or thyroid nodule.  Lungs: Clear anteriorly CV: Nondisplaced PMI.  Heart regular S1/S2, no S3/S4, no murmur.  1+ chronic edema to knees bilaterally.   Abdomen: Soft, obese, no distention.  Extremities: No clubbing or cyanosis.   TELEMETRY: Reviewed telemetry pt in  NSR  ASSESSMENT AND PLAN: 47 yo with h/o CAD s/p CABG, DM, and suspected OHS/OSA and suspected COPD presented with dyspnea to [facility], ended up intubated due to significant respiratory distress.  1. Pulmonary: Respiratory failure.  Hypercarbic/hypoxic respiratory failure in the setting OHS/OSA exacerbation and acute on chronic diastolic CHF.  He denies use of CPAP or home oxygen at home, he likely will need both.   2. 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  Echo at [facility] with EF 55-60% and dilated/dysfunctional RV with moderate-severe pulmonary HTN.  RHC showed elevated PCWP but PA pressure elevated out of proportion to this.  He diuresed well yesterday and weight is down another 4 lbs but remains volume overloaded. Creatinine stable at 1.6.  - Continue current IV Lasix.  3. Abnormal aortic valve: Question of old vegetation.  He is afebrile.  For now, would avoid TEE with respiratory failure.  4. CAD: Doubt ACS.  Continue statin, ASA.   5. AKI: Creatinine up to 1.6 with diuresis but stable.  Continue current IV Lasix. 6. 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  Diurese and treat with oxygen and CPAP.    Marca Ancona 01/22/2013 7:38 AM

## 2013-01-22 NOTE — Progress Notes (Signed)
Inpatient Diabetes Program Recommendations  AACE/ADA: New Consensus Statement on Inpatient Glycemic Control (2013)  Target Ranges:  Prepandial:   less than 140 mg/dL      Peak postprandial:   less than 180 mg/dL (1-2 hours)      Critically ill patients:  140 - 180 mg/dL   Reason for Visit: Results for Cesar Cantrell, Cesar Cantrell (MRN [id number]) as of 01/22/2013 12:28  Ref. Range 01/22/2013 05:00  Hemoglobin A1C Latest Range: <5.7 % 8.4 (H)   Note that A1C is elevated.  Consider starting basal insulin- Levemir 20 units daily while in hospital.  Needs follow-up with PCP regarding elevated A1C.

## 2013-01-22 NOTE — Progress Notes (Signed)
eLink Physician-Brief Progress Note Patient Name: Cesar Cantrell DOB: [birth year] MRN: [id number]  Date of Service  01/22/2013   HPI/Events of Note    Recent Labs Lab 01/17/13 1531 01/18/13 0336 01/20/13 1343 01/22/13 0406  PHART 7.296* 7.368  --  7.230*  PCO2ART 79.5* 62.4*  --  96.2*  PO2ART 57.0* 64.5*  --  150.0*  HCO3 38.8* 35.1* 36.2* 40.3*  TCO2 41 37.0 39 43  O2SAT 85.0 92.2 64.0 99.0   Patient was noticed to be hypoxemic on pulse ox while on BiPAP 40% FiO2. Therefore FiO2 increased to 100% with BiPAP settings continued at f 14/7 with a backup rate of 12. Followup ABG showed worsening respiratory acidosis with PO2 150   eICU Interventions   decrease FiO2 to 40%  Increase BiPAP rate 12 and settings of 15/8  Recheck ABG in half an hour to one hour    Intervention Category Major Interventions: Acid-Base disturbance - evaluation and management  Rafeef Lau 01/22/2013, 4:15 AM

## 2013-01-23 DIAGNOSIS — M7989 Other specified soft tissue disorders: Secondary | ICD-10-CM

## 2013-01-23 DIAGNOSIS — J9600 Acute respiratory failure, unspecified whether with hypoxia or hypercapnia: Secondary | ICD-10-CM

## 2013-01-23 LAB — BASIC METABOLIC PANEL
BUN: 45 mg/dL — ABNORMAL HIGH (ref 6–23)
CO2: 37 mEq/L — ABNORMAL HIGH (ref 19–32)
Calcium: 9.5 mg/dL (ref 8.4–10.5)
Creatinine, Ser: 1.61 mg/dL — ABNORMAL HIGH (ref 0.50–1.35)
GFR calc non Af Amer: 51 mL/min — ABNORMAL LOW (ref >90)
Glucose, Bld: 169 mg/dL — ABNORMAL HIGH (ref 70–99)

## 2013-01-23 LAB — GLUCOSE, CAPILLARY
Glucose-Capillary: 167 mg/dL — ABNORMAL HIGH (ref 70–99)
Glucose-Capillary: 213 mg/dL — ABNORMAL HIGH (ref 70–99)

## 2013-01-23 LAB — CULTURE, BLOOD (ROUTINE X 2): Culture: NO GROWTH

## 2013-01-23 NOTE — Progress Notes (Signed)
VASCULAR LAB PRELIMINARY  PRELIMINARY  PRELIMINARY  PRELIMINARY  Bilateral lower extremity venous duplex  completed.    Preliminary report:  Bilateral:  No evidence of DVT, superficial thrombosis, or Baker's Cyst.    Alexzavier Girardin, RVT 01/23/2013, 3:53 PM

## 2013-01-23 NOTE — Care Management Note (Addendum)
    Page 1 of 2   01/29/2013     3:09:01 PM   CARE MANAGEMENT NOTE 01/29/2013  Patient:  Cesar Cantrell, Cesar Cantrell   Account Number:  [id number]  Date Initiated:  01/21/2013  Documentation initiated by:  [facility]  Subjective/Objective Assessment:   Found unresponsive - intubated. - now extubated but oxygen demands are high.     Action/Plan:   Anticipated DC Date:  01/29/2013   Anticipated DC Plan:  HOME W HOME HEALTH SERVICES      DC Planning Services  CM consult      Choice offered to / List presented to:     DME arranged  BIPAP  OXYGEN      DME agency  [facility]        Status of service:  Completed, signed off Medicare Important Message given?   (If response is "NO", the following Medicare IM given date fields will be blank) Date Medicare IM given:   Date Additional Medicare IM given:    Discharge Disposition:  HOME/SELF CARE  Per UR Regulation:  Reviewed for med. necessity/level of care/duration of stay  If discussed at Long Length of Stay Meetings, dates discussed:   01/23/2013  01/28/2013    Comments:  ContactLourdes Sledge Mother [phone]  6/25 1505 debbie Harshil Cavallaro rn,bsn pt qualifies for home o2. [facility] having trouble getting pt qual for home bipap. they are going to try latest abg. mother and pt state are in process of applying for soc sec disability and [employer]. mother states if pt not qual for bipap she will sign waiver of respon and darian w ahc states pt will get bipap del tonight. pt will have 2 tanks to take in care since o2 at 4lit.ahc will set up concentrater when pt gets to [place].  6/24 1442 debbie Edrei Norgaard rn,bsn spoke w pt. states getting around ok in room. ahc working on home o2 and bipap. pt hopes for dc on 6-25. no ins so hhc will not be possible in [place].  01-24-13 9:30am Avie Arenas, RNBSN - [phone] Sitting up in chair doing word puzzles.  On [place] - states ready to go home.  understands may need oxygen and  bipap at night with sleep studies.  Sleep studies schedulated on July 17th 8pm,  Instructions on discharge summary. Paperwork on chart filled out by physician, and faxed back to Sleep studies.  Confirmed they recieved.  Physician states will place DME  orders for oxygen and bipap on discharge.  01-23-13 3:10pm Avie Arenas, RNBSN [phone] Due to lack of insurance is not a Ltach candidate - Per [facility] can use them for HH oxygen and bipap - will just need to place order under DME.  Will need sleep study scheduled within 30 days of discharge.  CM will continue to follow.  01-21-13 1:15pm Avie Arenas, RNBSN [phone] Requiring high oxygen demands - 50% VM - will probably need oxygen and probable cpap/bipap with sleep studies scheduled on discharge - is from [place]   CM will continue to follow.

## 2013-01-23 NOTE — Progress Notes (Signed)
PULMONARY  / CRITICAL CARE MEDICINE  Name: Cesar Cantrell MRN: [id number] DOB: [birth year]    ADMISSION DATE:  01/17/2013 CONSULTATION DATE:  01/17/13  REFERRING MD :  Dr. Diona Browner PRIMARY SERVICE:  Cardiology  CHIEF COMPLAINT:  Dyspnea       BRIEF PATIENT DESCRIPTION:  47 y.o. Obese white male, smoker with a history of inferior MI, 5 vessel CABG, type II DM, and hypertension presents from [facility] where he was found unresponsive with acute on chronic hypercarbic & hypoxic resp failure.  PCCM asked to consult on ventilatory support and pulmonary aspects of case.         SIGNIFICANT EVENTS / STUDIES:  6/13 Admitted in transfer from [facility] to Cards service 6/13 Echo: LVEF 55-60%, mild LA enlargement, mod RA enlargement, mild to mod RV dilatation, estimated RVSP 62 mmHg 6/16 RHC:  RV: 87/9 mm Hg, PA 84/81 mm Hg with mean 57 mm Hg, PCWP 25 mm Hg. 2.43 L/min/M2, severe pulmonary hypertension  LINES / TUBES: Nasal ETT >> 6/14 RUE PICC 6/15 >>   CULTURES: 6/13 Strep Antigen >> NEG 6/13 Blood >>NGTD 6/13 Sputum >>NGTD    ANTIBIOTICS: 6/13 Vanc >>> 6/15 6/13 Levaquin >> 6/15 changed to PO >>    SUBJECTIVE:  Patient spent a large amount of time yesterday on BiPap machine with much improved SpO2s.  Time off of bipap spent on either VM or non-rebreather mask.  Patient comfortable sitting in recliner and able to carry adequate conversation.        VITAL SIGNS: Temp:  [97 F (36.1 C)-97.9 F (36.6 C)] 97.8 F (36.6 C) (06/19 0400) Pulse Rate:  [78-99] 86 (06/19 0700) Resp:  [17-31] 17 (06/19 0700) BP: (124-154)/(63-77) 124/65 mmHg (06/19 0400) SpO2:  [85 %-100 %] 95 % (06/19 0700) FiO2 (%):  [40 %-100 %] 50 % (06/19 0400) Weight:  [131.4 kg (289 lb 11 oz)] 131.4 kg (289 lb 11 oz) (06/19 0600)  PHYSICAL EXAMINATION: General:  Obese white male, NAD Neuro:  A&O x 4, CN II-XII intact, no focal deficits, can follow commands appropriately HEENT:  NCAT, PERLA, EOM intact, MM  moist Cardiovascular: RRR s M Lungs: Diminished breath sounds at bases bilaterally , very soft intermittent rt basal crackles, no wheeze or rhonchi Abdomen:  Obese, soft, NT Ext: 2+ LE edema, legs dressed in gauze bilaterally.   Recent Labs Lab 01/21/13 0500 01/22/13 0500 01/23/13 0500  NA 134* 135 132*  K 4.8 4.5 4.2  CL 91* 91* 87*  CO2 37* 37* 37*  BUN 35* 40* 45*  CREATININE 1.59* 1.63* 1.61*  GLUCOSE 189* 178* 169*    Recent Labs Lab 01/19/13 0535 01/20/13 1500 01/21/13 0500  HGB 15.5 14.7 15.4  HCT 47.3 45.4 47.0  WBC 12.6* 10.9* 11.2*  PLT 207 180 190    ABG    Component Value Date/Time   PHART 7.240* 01/22/2013 0522   PCO2ART 92.6* 01/22/2013 0522   PO2ART 56.0* 01/22/2013 0522   HCO3 39.7* 01/22/2013 0522   TCO2 42 01/22/2013 0522   O2SAT 81.0 01/22/2013 0522     CXR: 6/18 - Bibasilar consolidations, left lower lobe ? Mild improvement.  Diminished lung volumes bilaterally.     ASSESSMENT / PLAN:  PULMONARY A:  Acute on chronic respiratory failure Respiratory Acidosis - 6/18 Severe Pulmonary Hypertension by RHC-6/17 Suspect decompensated OHS History compatible with OSA - not formally diagnosed PAH by Echo 6/13 (@ [facility] - report in shadow chart) Smoker Hypoxia - ? Combination of atelectasis & ?  Obstructive lung disease with severe PH causing VQ mismatch P:  Nebulized BDs Attempt wean to [place] during daytime, mandatory bipap nocturnal DC Goal of 6 L O2 [place], SpO2>88% ABG only if mental status worse or at dc Cont incentive spirometry Counseled re: smoking cessation Will need formal sleep eval & likely noct biPAP on discharge - his PCP is at [facility] Will likely need home O2  CARDIOVASCULAR A:  CAD H/O CHF Hyperlipidemia Cor pulmonale Abnormal aortic valve: Question of old vegetation Severe pulm hypertension - combination of hypoxia/OSA/ lung disease/ diastolic dysfunction rt heart cath 6/16 >> Mean RA: 17 mmHg  RV: 87/9 mmHg  PA: 84/41 mmHg  with a mean of 57 mmHg  PCWP: 25 mmHg  Fick Cardiac Output: 5.99 L/minute, 2.43 L/minute/M2   P:  -Mgmt per Cards - ? TEE at some point -okay to cont lasix 40 q 12h - good urine output   RENAL A:  Hypervolemia Hyperphosphatemia - 01/22/13 Hyponatremia 6/19 Hypochloremia 6/19 Met alkalosis due to loop diuresis P:   -watch Na -may have to limit lasix if lower than 130 -follow SCr, Na, PO4, and Cl on Bmet   GASTROINTESTINAL A:  Obesity P:   CHO mod diet   HEMATOLOGIC A:  No active issues P:  Monitor intermittently  INFECTIOUS A:   -LE Cellulitis/chronic venous stasis ulcers -Doubt CAP  P:   -levaquin x 7ds   ENDOCRINE A:  Type II DM   P:   -Cont SSI -needs to follow up with PCP for elevated A1C after acute illness resolves  NEUROLOGIC A:  No active issues P:   -continue to monitor closely  Summary - Sats considerably better after significant period on BiPap yesterday.  Need to aggressively start weaning down to [place] as he can tolerate it if Sats remain over 88%. Discharge endpoint when down to [place]  Is he a candidate for LTAC or rehab ?   [place], Tomma Lightning Physician Assistant Student   Care during the described time interval was provided by me and/or other providers on the critical care team.  I have reviewed this patient's available data, including medical history, events of note, physical examination and test results as part of my evaluation  01/23/13, 8:22 am

## 2013-01-24 DIAGNOSIS — E119 Type 2 diabetes mellitus without complications: Secondary | ICD-10-CM

## 2013-01-24 DIAGNOSIS — F17200 Nicotine dependence, unspecified, uncomplicated: Secondary | ICD-10-CM

## 2013-01-24 DIAGNOSIS — I10 Essential (primary) hypertension: Secondary | ICD-10-CM

## 2013-01-24 LAB — GLUCOSE, CAPILLARY
Glucose-Capillary: 208 mg/dL — ABNORMAL HIGH (ref 70–99)
Glucose-Capillary: 186 mg/dL — ABNORMAL HIGH (ref 70–99)
Glucose-Capillary: 192 mg/dL — ABNORMAL HIGH (ref 70–99)

## 2013-01-24 LAB — RENAL FUNCTION PANEL
BUN: 45 mg/dL — ABNORMAL HIGH (ref 6–23)
CO2: 34 mEq/L — ABNORMAL HIGH (ref 19–32)
Chloride: 88 mEq/L — ABNORMAL LOW (ref 96–112)
Creatinine, Ser: 1.51 mg/dL — ABNORMAL HIGH (ref 0.50–1.35)
Potassium: 4.8 mEq/L (ref 3.5–5.1)

## 2013-01-24 LAB — MAGNESIUM: Magnesium: 2.1 mg/dL (ref 1.5–2.5)

## 2013-01-24 MED ORDER — CEPHALEXIN 500 MG PO CAPS
500.0000 mg | ORAL_CAPSULE | Freq: Three times a day (TID) | ORAL | Status: DC
  Administered 2013-01-24 – 2013-01-29 (×16): 500 mg via ORAL
  Filled 2013-01-24 (×18): qty 1

## 2013-01-24 NOTE — Progress Notes (Signed)
Placed pt. On bipap per MD. Pt. Is currently tolerating well. RT to monitor.

## 2013-01-24 NOTE — Progress Notes (Signed)
PULMONARY  / CRITICAL CARE MEDICINE  Name: Cesar Cantrell MRN: [id number] DOB: March 14, 1966    ADMISSION DATE:  01/17/2013 CONSULTATION DATE:  01/17/13  REFERRING MD :  Dr. Diona Browner PRIMARY SERVICE:  Cardiology  CHIEF COMPLAINT:  Dyspnea       BRIEF PATIENT DESCRIPTION:  47 y.o. Obese white male, smoker with a history of inferior MI, 5 vessel CABG, type II DM, and hypertension presents from [facility] where he was found unresponsive with acute on chronic hypercarbic & hypoxic resp failure.  PCCM asked to consult on ventilatory support and pulmonary aspects of case.         SIGNIFICANT EVENTS / STUDIES:  6/13 Admitted in transfer from [facility] to Cards service 6/13 Echo: LVEF 55-60%, mild LA enlargement, mod RA enlargement, mild to mod RV dilatation, estimated RVSP 62 mmHg 6/16 RHC:  RV: 87/9 mm Hg, PA 84/81 mm Hg with mean 57 mm Hg, PCWP 25 mm Hg. 2.43 L/min/M2, severe pulmonary hypertension 6/19 BLE Doppler:  Negative for DVTs bilaterally  LINES / TUBES: Nasal ETT >> 6/14 RUE PICC 6/15 >>   CULTURES: 6/13 Strep Antigen >> NEG 6/13 Blood >>NGTD 6/13 Sputum >>NGTD    ANTIBIOTICS: 6/13 Vanc >>> 6/15 6/13 Levaquin >> 6/15 changed to PO >>6/19   SUBJECTIVE:  Patient resting comfortably on 5 L [place] satting at 88-90% this morning.  Says that he has been able to get up and move around.  By report he did not wear BiPAP overnight      VITAL SIGNS: Temp:  [97.4 F (36.3 C)-98.5 F (36.9 C)] 97.9 F (36.6 C) (06/20 0745) Pulse Rate:  [88-99] 94 (06/20 0745) Resp:  [15-23] 20 (06/20 0745) BP: (124-162)/(50-75) 124/50 mmHg (06/20 0745) SpO2:  [83 %-96 %] 90 % (06/20 0745) FiO2 (%):  [50 %] 50 % (06/19 0943)  PHYSICAL EXAMINATION: General:  Obese white male, NAD Neuro:  A&O x 4, CN II-XII intact, no focal deficits, can follow commands appropriately HEENT:  NCAT, PERLA, EOM intact, MM moist Cardiovascular: RRR s M Lungs: Diminished breath sounds at bases bilaterally , very  soft intermittent rt basalar crackles, no wheeze or rhonchi Abdomen:  Obese, soft, NT Ext: 2+ LE edema, legs dressed in gauze bilaterally.   Recent Labs Lab 01/22/13 0500 01/23/13 0500 01/24/13 0450  NA 135 132* 136  K 4.5 4.2 4.8  CL 91* 87* 88*  CO2 37* 37* 34*  BUN 40* 45* 45*  CREATININE 1.63* 1.61* 1.51*  GLUCOSE 178* 169* 173*    Recent Labs Lab 01/19/13 0535 01/20/13 1500 01/21/13 0500  HGB 15.5 14.7 15.4  HCT 47.3 45.4 47.0  WBC 12.6* 10.9* 11.2*  PLT 207 180 190    ABG    Component Value Date/Time   PHART 7.240* 01/22/2013 0522   PCO2ART 92.6* 01/22/2013 0522   PO2ART 56.0* 01/22/2013 0522   HCO3 39.7* 01/22/2013 0522   TCO2 42 01/22/2013 0522   O2SAT 81.0 01/22/2013 0522     CXR: 6/18 - Bibasilar consolidations, left lower lobe ? Mild improvement.  Diminished lung volumes bilaterally.     ASSESSMENT / PLAN:  PULMONARY A:  Acute on chronic respiratory failure Respiratory Acidosis - 6/18 Severe Pulmonary Hypertension by RHC-6/17 Hypoxia - ? Combination of atelectasis & ? Obstructive lung disease with severe PH causing VQ mismatch Suspect decompensated OHS History compatible with OSA - not formally diagnosed Secondary PAH by Echo 6/13 (@ [facility] - report in shadow chart) Smoker  P:  Nebulized BDs  Attempt wean to [place] during daytime, mandatory bipap nocturnal (he did not wear 6/19 pm) DC Goal of 6 L O2 [place], SpO2>88% ABG in am 6/21 Cont incentive spirometry Counseled re: smoking cessation Will need formal sleep eval & nocturnal BiPAP on discharge - his PCP is at [facility] Will need home O2 and home BiPAP Neg BLE dopplers, would defer any further PE workup  CARDIOVASCULAR A:  CAD H/O CHF Hyperlipidemia Cor pulmonale Abnormal aortic valve: Question of old vegetation Severe pulm hypertension - combination of hypoxia/OSA/ lung disease/ diastolic dysfunction/ L sided disease (PAOP 25) rt heart cath 6/16 >> Mean RA: 17 mmHg  RV: 87/9 mmHg  PA:  84/41 mmHg with a mean of 57 mmHg  PCWP: 25 mmHg  Fick Cardiac Output: 5.99 L/minute, 2.43 L/minute/M2  P:  -Mgmt per Cards - ? TEE at some point -okay to cont lasix 40 q 12h - good urine output   RENAL A:  Hypervolemia Hyperphosphatemia - 01/22/13 - resolved 6/20 Hyponatremia 6/19-resolved 6/20 Hypochloremia 6/19 Met alkalosis due to loop diuresis P:   -Na okay 6/20, cont to watch, limit lasix if lower than 130 -follow SCr, Na, PO4, and Cl on Bmet   GASTROINTESTINAL A:  Obesity P:   CHO mod diet   HEMATOLOGIC A:  No active issues P:  Monitor intermittently  INFECTIOUS A:   -LE Cellulitis/chronic venous stasis ulcers -Doubt CAP  P:   -levaquin x 7ds completed -start PO Keflex for cellulitis coverage (self pay), duration 7 more days   ENDOCRINE A:  Type II DM   P:   -Cont SSI -needs to follow up with PCP for elevated A1C after acute illness resolves  NEUROLOGIC A:  No active issues P:   -continue to monitor closely  Summary - Weaned down to 5-6 L O2 on [place].  Sats betweeen 87-92%.  Still needs mandatory nocturnal Bipap.  Completed CAP abx coverage.  Narrow abx to for cellulitis coverage.  Consider keflex or bactrim as patient is self pay.  Okay to continue diuresing.  Still having good output.  Consider moving out of stepdown tomorrow if continues to do well on [place] today.  Patient is not necessarily a good candidate for LTAC or rehab due to self pay status.     Carley Hammed Physician Assistant Student 01/23/13, 8:22 am  Levy Pupa, MD, PhD 01/24/2013, 9:29 AM [facility] [phone] or if no answer [phone]

## 2013-01-25 DIAGNOSIS — I2510 Atherosclerotic heart disease of native coronary artery without angina pectoris: Secondary | ICD-10-CM

## 2013-01-25 LAB — POCT I-STAT 3, ART BLOOD GAS (G3+)
Bicarbonate: 45.5 mEq/L — ABNORMAL HIGH (ref 20.0–24.0)
O2 Saturation: 94 %
Patient temperature: 98.6
TCO2: 48 mmol/L (ref 0–100)
pCO2 arterial: 88.7 mmHg (ref 35.0–45.0)
pH, Arterial: 7.318 — ABNORMAL LOW (ref 7.350–7.450)

## 2013-01-25 LAB — BASIC METABOLIC PANEL
BUN: 42 mg/dL — ABNORMAL HIGH (ref 6–23)
Chloride: 88 mEq/L — ABNORMAL LOW (ref 96–112)
GFR calc Af Amer: 66 mL/min — ABNORMAL LOW (ref >90)
GFR calc non Af Amer: 57 mL/min — ABNORMAL LOW (ref >90)
Potassium: 4 mEq/L (ref 3.5–5.1)

## 2013-01-25 LAB — GLUCOSE, CAPILLARY
Glucose-Capillary: 217 mg/dL — ABNORMAL HIGH (ref 70–99)
Glucose-Capillary: 174 mg/dL — ABNORMAL HIGH (ref 70–99)

## 2013-01-25 LAB — MAGNESIUM: Magnesium: 2 mg/dL (ref 1.5–2.5)

## 2013-01-25 LAB — PRO B NATRIURETIC PEPTIDE: Pro B Natriuretic peptide (BNP): 294.8 pg/mL — ABNORMAL HIGH (ref 0–125)

## 2013-01-25 MED ORDER — ACETAZOLAMIDE 250 MG PO TABS
250.0000 mg | ORAL_TABLET | Freq: Two times a day (BID) | ORAL | Status: AC
  Administered 2013-01-25 – 2013-01-26 (×4): 250 mg via ORAL
  Filled 2013-01-25 (×4): qty 1

## 2013-01-25 NOTE — Progress Notes (Signed)
Patient ID: Cesar Cantrell, male   DOB: 29-Jan-1966, 47 y.o.   MRN: [id number]   SUBJECTIVE:   He continues to diurese well. Breathing better. Weight seems inaccurate. Sitting up in chair with heavy sputum. Sats 95% on [place].  RHC Mean RA: 17 mmHg  RV: 87/9 mmHg  PA: 84/41 mmHg with a mean of 57 mmHg  PCWP: 25 mmHg  Fick Cardiac Output: 5.99 L/minute, 2.43 L/minute/M2 PVR: 5.3 [place]   Echocardiogram report summarized here:  Conclusions:  1. The left ventricular chamber size is normal. Moderate concentric  left ventricular hypertrophy is observed. There is normal left  ventricular systolic function. The estimated ejection fraction is  55-60%. There is septal flattening of the interventricular septum  consistent with right ventricular volume and pressure overload. The  left ventricular diastolic filling pattern is restrictive, ungraded.  The left ventricular diastolic filling pattern is consistent with  elevated mean left atrial pressure. The basal inferior wall segment is  hypokinetic (score 2). Overall wallmotion score index is 1.06  2. The left atrium is mildly dilated.  3. There is a trace of mitral regurgitation.  4. The aortic valve leaflets are mildly thickened. Rounded echodensity  noted in the LVOT, appears to be possibly affixed to the left coronary  cusp, not particularly mobile. Heterogeneous in echodensity. Cannot  exclude old vegetation. Would be unusual location for cardiac mass.  There is no evidence of aortic regurgitation. The mean gradient of the  aortic valve is 6 mmHg.  5. The right ventricle is mild to moderately dilated. The right  ventricular global systolic function is moderately reduced.  6. The right atrium is moderately dilated. The interatrial septum  bowed toward the left consistent with elevated right atrial pressure.  7. There is mild to intermittently moderate tricuspid regurgitation.  The right ventricular systolic pressure is calculated at 62 mmHg.    There is evidence of moderate to severe pulmonary hypertension.  8. There is no pericardial effusion.  9. CVP estimated at 15 mm mercury.   Marland Kitchen albuterol  2.5 mg Nebulization Q6H  . antiseptic oral rinse  15 mL Mouth Rinse BID  . aspirin EC  81 mg Oral Daily  . atorvastatin  20 mg Oral q1800  . cephALEXin  500 mg Oral Q8H  . furosemide  40 mg Intravenous BID  . heparin subcutaneous  5,000 Units Subcutaneous Q8H  . insulin aspart  0-20 Units Subcutaneous TID WC  . insulin aspart  0-5 Units Subcutaneous QHS  . ipratropium  0.5 mg Nebulization Q6H  . sodium chloride  3 mL Intravenous Q12H  . triamcinolone 0.1 % cream : eucerin  1 application Topical BID      Filed Vitals:   01/25/13 0301 01/25/13 0400 01/25/13 0726 01/25/13 0745  BP:  141/71  152/67  Pulse: 81 89  84  Temp: 97.3 F (36.3 C)   97.4 F (36.3 C)  TempSrc: Oral   Oral  Resp: 12 19  21   Height:      Weight:      SpO2: 97% 95% 93% 95%    Intake/Output Summary (Last 24 hours) at 01/25/13 1132 Last data filed at 01/25/13 0900  Gross per 24 hour  Intake   1720 ml  Output   3850 ml  Net  -2130 ml    LABS: Basic Metabolic Panel:  Recent Labs  40/98/11 0450 01/25/13 0455  NA 136 134*  K 4.8 4.0  CL 88* 88*  CO2 34* 43*  GLUCOSE 173* 223*  BUN 45* 42*  CREATININE 1.51* 1.46*  CALCIUM 9.7 10.0  MG 2.1 2.0  PHOS 4.4 5.0*   Liver Function Tests:  Recent Labs  01/24/13 0450  ALBUMIN 3.2*   No results found for this basename: LIPASE, AMYLASE,  in the last 72 hours CBC: No results found for this basename: WBC, NEUTROABS, HGB, HCT, MCV, PLT,  in the last 72 hours Cardiac Enzymes: No results found for this basename: CKTOTAL, CKMB, CKMBINDEX, TROPONINI,  in the last 72 hours BNP: No components found with this basename: POCBNP,  D-Dimer: No results found for this basename: DDIMER,  in the last 72 hours Hemoglobin A1C: No results found for this basename: HGBA1C,  in the last 72 hours Fasting Lipid  Panel: No results found for this basename: CHOL, HDL, LDLCALC, TRIG, CHOLHDL, LDLDIRECT,  in the last 72 hours Thyroid Function Tests: No results found for this basename: TSH, T4TOTAL, FREET3, T3FREE, THYROIDAB,  in the last 72 hours Anemia Panel: No results found for this basename: VITAMINB12, FOLATE, FERRITIN, TIBC, IRON, RETICCTPCT,  in the last 72 hours  RADIOLOGY: Dg Chest Port 1 View  01/17/2013   *RADIOLOGY REPORT*  Clinical Data: Left lower lobe infiltrate.  Intubation.  PORTABLE CHEST - 1 VIEW  Comparison: One-view chest 01/17/2013.  Findings: The patient is now intubated.  Endotracheal tube terminates 4.5 cm above the carina, at the level of the clavicles. Cardiac enlargement is stable.  Moderate pulmonary vascular congestion has increased slightly.  Bibasilar airspace disease is evident, worse on the left.  A small left pleural effusion is suspected.  IMPRESSION:  1.  Satisfactory positioning of the endotracheal tube. 2.  Stable cardiomegaly with increasing pulmonary vascular congestion, concerning for congestive heart failure. 3.  Bibasilar airspace disease likely reflects atelectasis, infection is not excluded. 4.  Probable small left pleural effusion.   Original Report Authenticated By: Marin Roberts, M.D.    PHYSICAL EXAM General: Sitting in chair. coughing Neuro: A & O x 3 Neck: JVP elevated to jaw, no thyromegaly or thyroid nodule.  Lungs: markedly decreased throughout CV: Nondisplaced PMI.  Heart regular S1/S2, 2/6 TR Abdomen: Soft, obese, no distention.  Extremities: No clubbing or cyanosis. Wrapped with cause 2+ edema multiple tattoos  TELEMETRY: Reviewed telemetry pt in NSR  ASSESSMENT AND PLAN: 47 yo with h/o CAD s/p CABG, DM, and suspected OHS/OSA and suspected COPD presented with dyspnea to [facility], ended up intubated due to significant respiratory distress.  1.Acute Respiratory failure.  Hypercarbic/hypoxic respiratory failure in the setting OHS/OSA  exacerbation and acute on chronic diastolic CHF.  He denies use of CPAP or home oxygen at home, he likely will need both.   2. 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  Echo at [facility] with EF 55-60% and dilated/dysfunctional RV with moderate-severe pulmonary HTN.  RHC showed elevated PCWP but PA pressure elevated out of proportion to this. -Continue current IV Lasix.  3. Abnormal aortic valve: Question of old vegetation.  He is afebrile.  For now, would avoid TEE with respiratory failure.  4. CAD s/p CABG: Doubt ACS.  Continue statin, ASA.   5. AKI: Creatinine up to 1.6 with diuresis but stable.  Continue current IV Lasix. 6. 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  Diurese and treat with oxygen and CPAP.  7. Acute cor pulmonale 8. Tobacco use - ongoing 9. COPD   Summary: Suspect PAH mostly due to hypoxic vasoconstriction  in setting of COPD, OHS, COPD with smaller component of diastolic dysfunction. Continue to optimize respiratory regimen. Still with significant RHF. Would continue to diurese as renal function tolerate. Once discharged can f/u in [facility] with me or Dr. Vassie Loll for repeat RHC and PFTs to assess for need for selective pulm vasodilators. Will also need to consider assessment for CTEPH at some point. Will ask P/CCM to assume care. We will follow.   Reuel Boom [place] 01/25/2013 11:32 AM

## 2013-01-25 NOTE — Progress Notes (Signed)
PULMONARY  / CRITICAL CARE MEDICINE  Name: Cesar Cantrell MRN: [id number] DOB: Mar 21, 1966    ADMISSION DATE:  01/17/2013 CONSULTATION DATE:  01/17/13  REFERRING MD :  Dr. Diona Browner PRIMARY SERVICE:  Cardiology  CHIEF COMPLAINT:  Dyspnea       BRIEF PATIENT DESCRIPTION:  47 y.o. Obese white male, smoker with a history of inferior MI, 5 vessel CABG, type II DM, and hypertension presents from [facility] where he was found unresponsive with acute on chronic hypercarbic & hypoxic resp failure.  PCCM asked to consult on ventilatory support and pulmonary aspects of case.         SIGNIFICANT EVENTS / STUDIES:  6/13 Admitted in transfer from [facility] to Cards service 6/13 Echo: LVEF 55-60%, mild LA enlargement, mod RA enlargement, mild to mod RV dilatation, estimated RVSP 62 mmHg 6/16 RHC:  RV: 87/9 mm Hg, PA 84/81 mm Hg with mean 57 mm Hg, PCWP 25 mm Hg. 2.43 L/min/M2, severe pulmonary hypertension 6/19 BLE Doppler:  Negative for DVTs bilaterally  LINES / TUBES: Nasal ETT >> 6/14 RUE PICC 6/15 >>    CULTURES: 6/13 Strep Antigen >> NEG 6/13 Blood >>NGTD 6/13 Sputum >>NGTD    ANTIBIOTICS: 6/13 Vanc >>> 6/15 6/13 Levaquin >> 6/15 changed to PO >>6/19   SUBJECTIVE:  No c/o.  Sitting OOB in chair on 5L [place].       VITAL SIGNS: Temp:  [97.3 F (36.3 C)-98.1 F (36.7 C)] 97.4 F (36.3 C) (06/21 0745) Pulse Rate:  [81-92] 84 (06/21 0745) Resp:  [12-28] 21 (06/21 0745) BP: (111-160)/(62-92) 152/67 mmHg (06/21 0745) SpO2:  [90 %-97 %] 95 % (06/21 0745) FiO2 (%):  [50 %] 50 % (06/21 0102)  PHYSICAL EXAMINATION: General:  Obese white male, NAD, OOB in chair  Neuro:  A&O x 4, CN II-XII intact, no focal deficits, can follow commands appropriately HEENT:  NCAT, PERLA, EOM intact, MM moist Cardiovascular: RRR s M Lungs: Diminished breath sounds at bases bilaterally, resps even non labored  Abdomen:  Obese, soft, NT Ext: 2+ LE edema, legs dressed in gauze bilaterally.   Recent  Labs Lab 01/23/13 0500 01/24/13 0450 01/25/13 0455  NA 132* 136 134*  K 4.2 4.8 4.0  CL 87* 88* 88*  CO2 37* 34* 43*  BUN 45* 45* 42*  CREATININE 1.61* 1.51* 1.46*  GLUCOSE 169* 173* 223*    Recent Labs Lab 01/19/13 0535 01/20/13 1500 01/21/13 0500  HGB 15.5 14.7 15.4  HCT 47.3 45.4 47.0  WBC 12.6* 10.9* 11.2*  PLT 207 180 190    ABG    Component Value Date/Time   PHART 7.240* 01/22/2013 0522   PCO2ART 92.6* 01/22/2013 0522   PO2ART 56.0* 01/22/2013 0522   HCO3 39.7* 01/22/2013 0522   TCO2 42 01/22/2013 0522   O2SAT 81.0 01/22/2013 0522     CXR:  No results found.     ASSESSMENT / PLAN:  PULMONARY A:  Acute on chronic respiratory failure Respiratory Acidosis - resolved.  Severe Pulmonary Hypertension by RHC-6/17 Hypoxia - ? Combination of atelectasis & ? Obstructive lung disease with severe PH causing VQ mismatch Suspect decompensated OHS History compatible with OSA - not formally diagnosed Smoker  P:  Cont BDs mandatory bipap nocturnal - tolerating, will need home setup Cont [place] daytime, currently 6L ABG in am 6/22 after bipap Cont incentive spirometry Counseled re: smoking cessation Will need formal sleep eval & nocturnal BiPAP on discharge - his PCP is at [facility] Neg BLE dopplers,  would defer any further PE workup  CARDIOVASCULAR A:  CAD H/O CHF Hyperlipidemia Cor pulmonale Abnormal aortic valve: Question of old vegetation Severe pulm hypertension - combination of hypoxia/OSA/ lung disease/ diastolic dysfunction/ L sided disease (PAOP 25) rt heart cath 6/16 >> Mean RA: 17 mmHg  RV: 87/9 mmHg  PA: 84/41 mmHg with a mean of 57 mmHg  PCWP: 25 mmHg  Fick Cardiac Output: 5.99 L/minute, 2.43 L/minute/M2  P:  -Mgmt per Cards - ? TEE at some point -cont lasix per cards    RENAL A:  Hypervolemia Hypochloremia 6/19 Met alkalosis due to loop diuresis P:   -follow SCr, Na, PO4, and Cl on Bmet   GASTROINTESTINAL A:  Obesity P:   CHO mod  diet   HEMATOLOGIC A:  No active issues P:  Monitor intermittently  INFECTIOUS A:   -LE Cellulitis/chronic venous stasis ulcers -Doubt CAP  P:   -levaquin x 7ds completed -cont PO Keflex for cellulitis coverage (self pay), duration 7 days   ENDOCRINE A:  Type II DM   P:   -Cont SSI -needs to follow up with PCP for elevated A1C after acute illness resolves  NEUROLOGIC A:  No active issues P:   -continue to monitor closely  Summary - Weaned down to 5-6 L O2 on [place].  Sats 92%.  Still needs mandatory nocturnal Bipap.  Completed CAP abx coverage.  Narrow abx to keflex for cellulitis coverage.  Cont diuresis. Ok to tx to floor from [employer] standpoint.  Will need to f/u with PCP as outpt for sleep eval, etc.      Danford Bad, NP 01/25/2013  9:11 AM Pager: [phone] or [phone]  *Care during the described time interval was provided by me and/or other providers on the critical care team. I have reviewed this patient's available data, including medical history, events of note, physical examination and test results as part of my evaluation.   Will change to PCCM service.  Coralyn Helling, MD [facility] Pulmonary/Critical Care 01/25/2013, 12:17 PM Pager:  [phone] After 3pm call: [phone]

## 2013-01-25 NOTE — Progress Notes (Signed)
RN removed pt ffrom Bipap in order top eat. Pt awake  and alert per RN.

## 2013-01-26 ENCOUNTER — Inpatient Hospital Stay (HOSPITAL_COMMUNITY): Payer: Medicaid - Out of State

## 2013-01-26 LAB — POCT I-STAT 3, ART BLOOD GAS (G3+)
Patient temperature: 98.1
pCO2 arterial: 79.3 mmHg (ref 35.0–45.0)
pH, Arterial: 7.341 — ABNORMAL LOW (ref 7.350–7.450)

## 2013-01-26 LAB — BASIC METABOLIC PANEL
CO2: 41 mEq/L (ref 19–32)
Chloride: 89 mEq/L — ABNORMAL LOW (ref 96–112)
GFR calc Af Amer: 67 mL/min — ABNORMAL LOW (ref >90)
Potassium: 3.9 mEq/L (ref 3.5–5.1)
Sodium: 135 mEq/L (ref 135–145)

## 2013-01-26 LAB — GLUCOSE, CAPILLARY
Glucose-Capillary: 167 mg/dL — ABNORMAL HIGH (ref 70–99)
Glucose-Capillary: 244 mg/dL — ABNORMAL HIGH (ref 70–99)
Glucose-Capillary: 238 mg/dL — ABNORMAL HIGH (ref 70–99)

## 2013-01-26 MED ORDER — SODIUM CHLORIDE 0.9 % IV SOLN: INTRAVENOUS | Status: DC | PRN

## 2013-01-26 MED ORDER — TORSEMIDE 20 MG PO TABS
20.0000 mg | ORAL_TABLET | Freq: Two times a day (BID) | ORAL | Status: DC
  Administered 2013-01-27 – 2013-01-29 (×5): 20 mg via ORAL
  Filled 2013-01-26 (×7): qty 1

## 2013-01-26 NOTE — Progress Notes (Signed)
Pt. Requested to come off the bipap. Pt. Placed on 5L [place].

## 2013-01-26 NOTE — Progress Notes (Signed)
Patient ID: Cesar Cantrell, male   DOB: [birth year], 47 y.o.   MRN: [id number]   SUBJECTIVE:   He continues to diurese well. Breathing better. Productive cough much improved.  No BMET or weight on chart for today.   RHC Mean RA: 17 mmHg  RV: 87/9 mmHg  PA: 84/41 mmHg with a mean of 57 mmHg  PCWP: 25 mmHg  Fick Cardiac Output: 5.99 L/minute, 2.43 L/minute/M2 PVR: 5.3 [place]   Echocardiogram report summarized here:  Conclusions:  1. LV 55-60% + diastolid dysfx 2. RV is mild to moderately dilated. The right  ventricular global systolic function is moderately reduced.  3. The right atrium is moderately dilated. The interatrial septum  bowed toward the left consistent with elevated right atrial pressure.  4. There is mild to moderate tricuspid regurgitation.  The right ventricular systolic pressure is calculated at 62 mmHg.  There is evidence of moderate to severe pulmonary hypertension.     Marland Kitchen acetaZOLAMIDE  250 mg Oral BID  . albuterol  2.5 mg Nebulization Q6H  . antiseptic oral rinse  15 mL Mouth Rinse BID  . aspirin EC  81 mg Oral Daily  . atorvastatin  20 mg Oral q1800  . cephALEXin  500 mg Oral Q8H  . furosemide  40 mg Intravenous BID  . heparin subcutaneous  5,000 Units Subcutaneous Q8H  . insulin aspart  0-20 Units Subcutaneous TID WC  . insulin aspart  0-5 Units Subcutaneous QHS  . ipratropium  0.5 mg Nebulization Q6H  . sodium chloride  3 mL Intravenous Q12H  . triamcinolone 0.1 % cream : eucerin  1 application Topical BID      Filed Vitals:   01/26/13 0400 01/26/13 0403 01/26/13 0537 01/26/13 0808  BP: 155/75   148/68  Pulse: 73 82  82  Temp: 98.2 F (36.8 C)   97.5 F (36.4 C)  TempSrc: Oral   Oral  Resp: 12 17  21   Height:      Weight:      SpO2: 91% 95% 96% 86%    Intake/Output Summary (Last 24 hours) at 01/26/13 0907 Last data filed at 01/26/13 0800  Gross per 24 hour  Intake   1840 ml  Output   5276 ml  Net  -3436 ml    LABS: Basic Metabolic  Panel:  Recent Labs  01/24/13 0450 01/25/13 0455  NA 136 134*  K 4.8 4.0  CL 88* 88*  CO2 34* 43*  GLUCOSE 173* 223*  BUN 45* 42*  CREATININE 1.51* 1.46*  CALCIUM 9.7 10.0  MG 2.1 2.0  PHOS 4.4 5.0*   Liver Function Tests:  Recent Labs  01/24/13 0450  ALBUMIN 3.2*   No results found for this basename: LIPASE, AMYLASE,  in the last 72 hours CBC: No results found for this basename: WBC, NEUTROABS, HGB, HCT, MCV, PLT,  in the last 72 hours Cardiac Enzymes: No results found for this basename: CKTOTAL, CKMB, CKMBINDEX, TROPONINI,  in the last 72 hours BNP: No components found with this basename: POCBNP,  D-Dimer: No results found for this basename: DDIMER,  in the last 72 hours Hemoglobin A1C: No results found for this basename: HGBA1C,  in the last 72 hours Fasting Lipid Panel: No results found for this basename: CHOL, HDL, LDLCALC, TRIG, CHOLHDL, LDLDIRECT,  in the last 72 hours Thyroid Function Tests: No results found for this basename: TSH, T4TOTAL, FREET3, T3FREE, THYROIDAB,  in the last 72 hours Anemia Panel: No results found for  this basename: VITAMINB12, FOLATE, FERRITIN, TIBC, IRON, RETICCTPCT,  in the last 72 hours  RADIOLOGY: Dg Chest Port 1 View  01/17/2013   *RADIOLOGY REPORT*  Clinical Data: Left lower lobe infiltrate.  Intubation.  PORTABLE CHEST - 1 VIEW  Comparison: One-view chest 01/17/2013.  Findings: The patient is now intubated.  Endotracheal tube terminates 4.5 cm above the carina, at the level of the clavicles. Cardiac enlargement is stable.  Moderate pulmonary vascular congestion has increased slightly.  Bibasilar airspace disease is evident, worse on the left.  A small left pleural effusion is suspected.  IMPRESSION:  1.  Satisfactory positioning of the endotracheal tube. 2.  Stable cardiomegaly with increasing pulmonary vascular congestion, concerning for congestive heart failure. 3.  Bibasilar airspace disease likely reflects atelectasis, infection is  not excluded. 4.  Probable small left pleural effusion.   Original Report Authenticated By: Marin Roberts, M.D.    PHYSICAL EXAM General: Sitting in chair. coughing Neuro: A & O x 3 HEENT: Bloodshot eyes Neck: Thick JVP hard to see, no thyromegaly or thyroid nodule.  Lungs:decreased throughout no wheeze CV: Nondisplaced PMI.  Heart regular S1/S2, 2/6 TR Abdomen: Soft, obese, no distention.  Extremities: No clubbing or cyanosis. Wrapped with gauze 1-2+ edema multiple tattoos  TELEMETRY: Reviewed telemetry pt in NSR  ASSESSMENT AND PLAN: 47 yo with h/o CAD s/p CABG, DM, and suspected OHS/OSA and suspected COPD presented with dyspnea to [facility], ended up intubated due to significant respiratory distress.  1.Acute Respiratory failure.  Hypercarbic/hypoxic respiratory failure in the setting OHS/OSA exacerbation and acute on chronic diastolic CHF.  He denies use of CPAP or home oxygen at home, he likely will need both.   2. 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   3. Abnormal aortic valve: Question of old vegetation.  He is afebrile.  For now, would avoid TEE with respiratory failure.  4. CAD s/p CABG: Doubt ACS.  Continue statin, ASA.   5. AKI: recheck BMET today 6. 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  Diurese and treat with oxygen and CPAP.  7. Acute cor pulmonale 8. Tobacco use - ongoing 9. COPD   Summary: Suspect PAH mostly due to hypoxic vasoconstriction in setting of COPD, OHS, COPD with smaller component of diastolic dysfunction. RHF/volume status much improved. Will change IV lasix to po demadex 20 bid. Will get BMET today. Likely ready for the floor.   Once discharged can f/u in [facility] with me or Dr. Vassie Loll for PFTs and consideration of possible repeat RHC to assess for need for selective pulm vasodilators - though I think the most important thing for his PH is to optimize his pulmonary status.    I feel he is too high risk for TEE due to resp status. Would follow clinically.   Reuel Boom Bensimhon 01/26/2013 9:07 AM

## 2013-01-26 NOTE — Progress Notes (Signed)
Placed pt. On bipap. Pt. Currently tolerating well.

## 2013-01-26 NOTE — Progress Notes (Signed)
PULMONARY  / CRITICAL CARE MEDICINE  Name: Cesar Cantrell MRN: [id number] DOB: [birth year]    ADMISSION DATE:  01/17/2013 CONSULTATION DATE:  01/17/13  REFERRING MD :  Dr. Diona Browner PRIMARY SERVICE:  Cardiology  CHIEF COMPLAINT:  Dyspnea       BRIEF PATIENT DESCRIPTION:  47 y.o. Obese white male, smoker with a history of inferior MI, 5 vessel CABG, type II DM, and hypertension presents from [facility] where he was found unresponsive with acute on chronic hypercarbic & hypoxic resp failure.  PCCM asked to consult on ventilatory support and pulmonary aspects of case.         SIGNIFICANT EVENTS / STUDIES:  6/13 Admitted in transfer from [facility] to Cards service 6/13 Echo: LVEF 55-60%, mild LA enlargement, mod RA enlargement, mild to mod RV dilatation, estimated RVSP 62 mmHg 6/16 RHC:  RV: 87/9 mm Hg, PA 84/81 mm Hg with mean 57 mm Hg, PCWP 25 mm Hg. 2.43 L/min/M2, severe pulmonary hypertension 6/19 BLE Doppler:  Negative for DVTs bilaterally  LINES / TUBES: Nasal ETT >> 6/14 RUE PICC 6/15 >>    CULTURES: 6/13 Strep Antigen >> NEG 6/13 Blood >>NGTD 6/13 Sputum >>NGTD    ANTIBIOTICS: 6/13 Vanc >>> 6/15 6/13 Levaquin >> 6/15 changed to PO >>6/19   SUBJECTIVE:  Feels better.  Wants to know when he is ready to go home.      VITAL SIGNS: Temp:  [97.5 F (36.4 C)-98.2 F (36.8 C)] 97.5 F (36.4 C) (06/22 0808) Pulse Rate:  [73-92] 82 (06/22 0808) Resp:  [12-25] 21 (06/22 0808) BP: (148-155)/(68-75) 148/68 mmHg (06/22 0808) SpO2:  [78 %-96 %] 86 % (06/22 0808)  PHYSICAL EXAMINATION: General: no distress Neuro:  Alert, normal strength HEENT: no sinus tenderness Cardiovascular: regular Lungs: Diminished breath sounds at bases bilaterally, resps even non labored  Abdomen:  Obese, soft, NT Ext: 2+ LE edema, legs dressed in gauze bilaterally.   Recent Labs Lab 01/23/13 0500 01/24/13 0450 01/25/13 0455  NA 132* 136 134*  K 4.2 4.8 4.0  CL 87* 88* 88*  CO2 37*  34* 43*  BUN 45* 45* 42*  CREATININE 1.61* 1.51* 1.46*  GLUCOSE 169* 173* 223*    Recent Labs Lab 01/20/13 1500 01/21/13 0500  HGB 14.7 15.4  HCT 45.4 47.0  WBC 10.9* 11.2*  PLT 180 190    ABG    Component Value Date/Time   PHART 7.341* 01/26/2013 0254   PCO2ART 79.3* 01/26/2013 0254   PO2ART 76.0* 01/26/2013 0254   HCO3 43.0* 01/26/2013 0254   TCO2 45 01/26/2013 0254   O2SAT 93.0 01/26/2013 0254     CXR:  No results found.     ASSESSMENT / PLAN:  PULMONARY A:  Acute on chronic respiratory failure Chronic Respiratory Acidosis. Severe Pulmonary Hypertension by RHC-6/17 Hypoxia - ? Combination of atelectasis & ? Obstructive lung disease with severe PH causing VQ mismatch Suspect decompensated OHS History compatible with OSA - not formally diagnosed Smoker  P:  Cont BDs mandatory bipap nocturnal - tolerating, will need home setup Cont [place] daytime, currently 6L ABG in am 6/22 after bipap Cont incentive spirometry Counseled re: smoking cessation Will need formal sleep eval & nocturnal BiPAP on discharge - his PCP is at [facility] Neg BLE dopplers, would defer any further PE workup  CARDIOVASCULAR A:  CAD H/O CHF Hyperlipidemia Cor pulmonale Abnormal aortic valve: Question of old vegetation Severe pulm hypertension - combination of hypoxia/OSA/ lung disease/ diastolic dysfunction/ L sided disease (PAOP  25) rt heart cath 6/16 >> Mean RA: 17 mmHg  RV: 87/9 mmHg  PA: 84/41 mmHg with a mean of 57 mmHg  PCWP: 25 mmHg  Fick Cardiac Output: 5.99 L/minute, 2.43 L/minute/M2  P:  -Mgmt per Cards -cont lasix per cards    RENAL A:  Hypervolemia Hypochloremia 6/19 Met alkalosis due to loop diuresis P:   -follow SCr, Na, PO4, and Cl on Bmet   GASTROINTESTINAL A:  Obesity P:   CHO mod diet   HEMATOLOGIC A:  No active issues P:  Monitor intermittently  INFECTIOUS A:   -LE Cellulitis/chronic venous stasis ulcers -Doubt CAP  P:   -levaquin x 7ds  completed -cont PO Keflex for cellulitis coverage (self pay), duration 7 days   ENDOCRINE A:  Type II DM   P:   -Cont SSI -needs to follow up with PCP for elevated A1C after acute illness resolves  NEUROLOGIC A:  No active issues P:   -continue to monitor closely  Coralyn Helling, MD [facility] Pulmonary/Critical Care 01/26/2013, 8:32 AM Pager:  [phone] After 3pm call: [phone]

## 2013-01-27 LAB — GLUCOSE, CAPILLARY
Glucose-Capillary: 175 mg/dL — ABNORMAL HIGH (ref 70–99)
Glucose-Capillary: 293 mg/dL — ABNORMAL HIGH (ref 70–99)

## 2013-01-27 LAB — BASIC METABOLIC PANEL
BUN: 41 mg/dL — ABNORMAL HIGH (ref 6–23)
Chloride: 91 mEq/L — ABNORMAL LOW (ref 96–112)
GFR calc Af Amer: 66 mL/min — ABNORMAL LOW (ref >90)
GFR calc non Af Amer: 57 mL/min — ABNORMAL LOW (ref >90)
Glucose, Bld: 228 mg/dL — ABNORMAL HIGH (ref 70–99)
Potassium: 3.7 mEq/L (ref 3.5–5.1)
Sodium: 136 mEq/L (ref 135–145)

## 2013-01-27 LAB — CBC
HCT: 46.1 % (ref 39.0–52.0)
Hemoglobin: 15.1 g/dL (ref 13.0–17.0)
MCHC: 32.8 g/dL (ref 30.0–36.0)
WBC: 9.8 10*3/uL (ref 4.0–10.5)

## 2013-01-27 NOTE — Progress Notes (Signed)
Patient ID: Cesar Cantrell, male   DOB: 02-10-1966, 47 y.o.   MRN: [id number]    SUBJECTIVE:   He continues to diurese well, now on po torsemide. Breathing better.  Weight continues to fall, stable creatinine.  RHC Mean RA: 17 mmHg  RV: 87/9 mmHg  PA: 84/41 mmHg with a mean of 57 mmHg  PCWP: 25 mmHg  Fick Cardiac Output: 5.99 L/minute, 2.43 L/minute/M2 PVR: 5.3 [place]   Echocardiogram report summarized here:  Conclusions:  1. LV 55-60% + diastolid dysfx 2. RV is mild to moderately dilated. The right  ventricular global systolic function is moderately reduced.  3. The right atrium is moderately dilated. The interatrial septum  bowed toward the left consistent with elevated right atrial pressure.  4. There is mild to moderate tricuspid regurgitation.  The right ventricular systolic pressure is calculated at 62 mmHg.  There is evidence of moderate to severe pulmonary hypertension.     Marland Kitchen albuterol  2.5 mg Nebulization Q6H  . antiseptic oral rinse  15 mL Mouth Rinse BID  . aspirin EC  81 mg Oral Daily  . atorvastatin  20 mg Oral q1800  . cephALEXin  500 mg Oral Q8H  . heparin subcutaneous  5,000 Units Subcutaneous Q8H  . insulin aspart  0-20 Units Subcutaneous TID WC  . insulin aspart  0-5 Units Subcutaneous QHS  . ipratropium  0.5 mg Nebulization Q6H  . torsemide  20 mg Oral BID  . triamcinolone 0.1 % cream : eucerin  1 application Topical BID      Filed Vitals:   01/26/13 2345 01/27/13 0417 01/27/13 0600 01/27/13 0800  BP: 135/83 161/86  149/65  Pulse: 88 81 71 81  Temp: 98.5 F (36.9 C) 98.3 F (36.8 C)  97.9 F (36.6 C)  TempSrc:  Oral  Oral  Resp: 18 23 13 15   Height:      Weight:   283 lb 8.2 oz (128.6 kg)   SpO2: 100% 100% 100% 99%    Intake/Output Summary (Last 24 hours) at 01/27/13 0819 Last data filed at 01/27/13 0700  Gross per 24 hour  Intake   1550 ml  Output   3430 ml  Net  -1880 ml    LABS: Basic Metabolic Panel:  Recent Labs   01/25/13 0455 01/26/13 1320 01/27/13 0407  NA 134* 135 136  K 4.0 3.9 3.7  CL 88* 89* 91*  CO2 43* 41* 40*  GLUCOSE 223* 252* 228*  BUN 42* 42* 41*  CREATININE 1.46* 1.44* 1.46*  CALCIUM 10.0 9.8 10.0  MG 2.0  --   --   PHOS 5.0*  --   --    Liver Function Tests: No results found for this basename: AST, ALT, ALKPHOS, BILITOT, PROT, ALBUMIN,  in the last 72 hours No results found for this basename: LIPASE, AMYLASE,  in the last 72 hours CBC:  Recent Labs  01/27/13 0407  WBC 9.8  HGB 15.1  HCT 46.1  MCV 79.6  PLT 164   Cardiac Enzymes: No results found for this basename: CKTOTAL, CKMB, CKMBINDEX, TROPONINI,  in the last 72 hours BNP: No components found with this basename: POCBNP,  D-Dimer: No results found for this basename: DDIMER,  in the last 72 hours Hemoglobin A1C: No results found for this basename: HGBA1C,  in the last 72 hours Fasting Lipid Panel: No results found for this basename: CHOL, HDL, LDLCALC, TRIG, CHOLHDL, LDLDIRECT,  in the last 72 hours Thyroid Function Tests: No results  found for this basename: TSH, T4TOTAL, FREET3, T3FREE, THYROIDAB,  in the last 72 hours Anemia Panel: No results found for this basename: VITAMINB12, FOLATE, FERRITIN, TIBC, IRON, RETICCTPCT,  in the last 72 hours  RADIOLOGY: Dg Chest Port 1 View  01/17/2013   *RADIOLOGY REPORT*  Clinical Data: Left lower lobe infiltrate.  Intubation.  PORTABLE CHEST - 1 VIEW  Comparison: One-view chest 01/17/2013.  Findings: The patient is now intubated.  Endotracheal tube terminates 4.5 cm above the carina, at the level of the clavicles. Cardiac enlargement is stable.  Moderate pulmonary vascular congestion has increased slightly.  Bibasilar airspace disease is evident, worse on the left.  A small left pleural effusion is suspected.  IMPRESSION:  1.  Satisfactory positioning of the endotracheal tube. 2.  Stable cardiomegaly with increasing pulmonary vascular congestion, concerning for congestive  heart failure. 3.  Bibasilar airspace disease likely reflects atelectasis, infection is not excluded. 4.  Probable small left pleural effusion.   Original Report Authenticated By: Marin Roberts, M.D.    PHYSICAL EXAM General: Sitting in chair. coughing Neuro: A & O x 3 HEENT: Bloodshot eyes Neck: Thick JVP 8-9 cm, no thyromegaly or thyroid nodule.  Lungs:decreased throughout no wheeze CV: Nondisplaced PMI.  Heart regular S1/S2, 2/6 TR Abdomen: Soft, obese, no distention.  Extremities: No clubbing or cyanosis. Wrapped with gauze 1-2+ edema multiple tattoos  TELEMETRY: Reviewed telemetry pt in NSR  ASSESSMENT AND PLAN: 47 yo with h/o CAD s/p CABG, DM, and suspected OHS/OSA and suspected COPD presented with dyspnea to [facility], ended up intubated due to significant respiratory distress.  1.Acute Respiratory failure.  Hypercarbic/hypoxic respiratory failure in the setting OHS/OSA exacerbation and acute on chronic diastolic CHF.  He denies use of CPAP or home oxygen at home, he likely will need both.   2. 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   3. Abnormal aortic valve: Question of old vegetation.  He is afebrile.  For now, would avoid TEE with respiratory failure.  4. CAD s/p CABG: Doubt ACS.  Continue statin, ASA.   5. AKI: recheck BMET today 6. 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  Diurese and treat with oxygen and CPAP.  7. Acute cor pulmonale 8. Tobacco use - ongoing 9. COPD   Summary: Suspect PAH mostly due to hypoxic vasoconstriction in setting of COPD, OHS, COPD with smaller component of diastolic dysfunction. RHF/volume status much improved. Now on po torsemide.  Stable from cardiac standpoint, should be ready for discharge soon.   Needs close cardiology followup after discharge.  Will likely need repeat RHC to assess for need for selective pulm vasodilators - though I think the most important  thing for his PH is to optimize his pulmonary status.   I feel he is too high risk for TEE due to resp status. Would follow clinically.   Marca Ancona 01/27/2013 8:19 AM

## 2013-01-27 NOTE — Evaluation (Signed)
Occupational Therapy Evaluation Patient Details Name: Cesar Cantrell MRN: [id number] DOB: May 03, 1966 Today's Date: 01/27/2013 Time: 1478-2956 OT Time Calculation (min): 22 min  OT Assessment / Plan / Recommendation Clinical Impression  This 47 yo admitted with Right heart failure, anasarca presents to acute OT with problems below. Will benefit from acute OT without need for follow up.    OT Assessment  Patient needs continued OT Services    Follow Up Recommendations  No OT follow up    Barriers to Discharge None    Equipment Recommendations  None recommended by OT       Frequency  Min 2X/week    Precautions / Restrictions Precautions Precautions: Fall Restrictions Weight Bearing Restrictions: No       ADL  Eating/Feeding: Performed;Independent Where Assessed - Eating/Feeding: Chair Grooming: Simulated;Set up Where Assessed - Grooming: Unsupported sitting Upper Body Bathing: Simulated;Set up Where Assessed - Upper Body Bathing: Unsupported sitting Lower Body Bathing: Simulated;Min guard Where Assessed - Lower Body Bathing: Unsupported sit to stand Upper Body Dressing: Simulated;Set up Where Assessed - Upper Body Dressing: Unsupported sitting Lower Body Dressing: Performed;Min guard Where Assessed - Lower Body Dressing: Unsupported sit to stand Toilet Transfer: Simulated;Minimal assistance Toilet Transfer Method: Sit to stand Toilet Transfer Equipment:  (Bed>down hall> sit in recliner behind him) Therapist, nutritional and Hygiene: Simulated;Min guard Where Assessed - Engineer, mining and Hygiene: Standing Equipment Used: Gait belt;Rolling walker (O2 at 6 liters) Transfers/Ambulation Related to ADLs: Min guard A for sit to stand and stand to sit, min A for ambulation without RW ADL Comments: Pt crosses one leg over the other to donn his socks    OT Diagnosis: Generalized weakness  OT Problem List: Impaired balance (sitting and/or  standing);Cardiopulmonary status limiting activity OT Treatment Interventions: Self-care/ADL training;Patient/family education;Balance training;Energy conservation   OT Goals Acute Rehab OT Goals OT Goal Formulation: With patient Time For Goal Achievement: 02/10/13 Potential to Achieve Goals: Good ADL Goals Pt Will Perform Grooming: with supervision;Unsupported;Standing at sink (pt setting himself up) ADL Goal: Grooming - Progress: Goal set today Pt Will Transfer to Toilet: with supervision;Ambulation;with DME;Comfort height toilet;Regular height toilet;3-in-1;Grab bars ADL Goal: Toilet Transfer - Progress: Goal set today Pt Will Perform Toileting - Clothing Manipulation: Independently;Standing ADL Goal: Toileting - Clothing Manipulation - Progress: Goal set today Pt Will Perform Toileting - Hygiene: Independently;Sit to stand from 3-in-1/toilet ADL Goal: Toileting - Hygiene - Progress: Goal set today Miscellaneous OT Goals Miscellaneous OT Goal #1: Pt will be aware of energy conservation stratigies that may be helpful for him from energy conservation handout. OT Goal: Miscellaneous Goal #1 - Progress: Goal set today  Visit Information  Last OT Received On: 01/27/13 Assistance Needed: +1 (2nd person nice for chair follow and lines) PT/OT Co-Evaluation/Treatment: Yes    Subjective Data  Subjective: "I feel better than I did when I came in here"   Prior Functioning     Home Living Lives With: Family Available Help at Discharge: Family;Available PRN/intermittently Type of Home: House Home Access: Stairs to enter [employer] of Steps: 3 Entrance Stairs-Rails: Can reach both Home Layout: Two level Alternate Level Stairs-Number of Steps: 1 Bathroom Shower/Tub: Tub/shower unit;Door [employer] Toilet: Standard Home Adaptive Equipment: Hand-held shower hose Prior Function Level of Independence: Independent Able to Take Stairs?: Yes Driving: Yes Vocation: Part time  employment Communication Communication: No difficulties Dominant Hand: Right         Vision/Perception Vision - History Baseline Vision: Wears glasses only for reading  Patient Visual Report: No change from baseline   Cognition  Cognition Arousal/Alertness: Awake/alert Behavior During Therapy: WFL for tasks assessed/performed Overall Cognitive Status: Within Functional Limits for tasks assessed    Extremity/Trunk Assessment Right Upper Extremity Assessment RUE ROM/Strength/Tone: [facility] for tasks assessed Left Upper Extremity Assessment LUE ROM/Strength/Tone: WFL for tasks assessed Right Lower Extremity Assessment RLE ROM/Strength/Tone: WFL for tasks assessed (noted edema/swelling) Left Lower Extremity Assessment LLE ROM/Strength/Tone: WFL for tasks assessed (noted edema swelling) Trunk Assessment Trunk Assessment: Normal     Mobility Bed Mobility Bed Mobility: Not assessed Transfers Sit to Stand: 4: Min guard Stand to Sit: 4: Min guard Details for Transfer Assistance: increased time, mild unsteadiness upon intial stand. wide base of support, definite use of UEs           End of Session OT - End of Session Equipment Utilized During Treatment: Gait belt Activity Tolerance: Patient tolerated treatment well Patient left: in chair;with call bell/phone within reach       Evette Georges [phone] 01/27/2013, 10:32 AM

## 2013-01-27 NOTE — Progress Notes (Signed)
NUTRITION FOLLOW UP  Intervention:   1. No nutrition interventions at this time.   Nutrition Dx:   Inadequate oral intake related to inability to eat now evidenced by NPO diet status. Resolved   Goal:   Diet advance post op to meet >/=90% estimated nutrition needs. Met   Monitor:   Po intake, weight trends, I/O's, labs   Assessment:   Pt was extubated on 6/14. S/p RHC on 6/16.  Continues to diureses. Weaning from O2, currently remains on [place] during the day and Bipap at night.  Continues to eat 100% of most meals. Pt denies any nutrition needs at this time.   Height: Ht Readings from Last 1 Encounters:  01/17/13 5\' 9"  (1.753 m)    Weight Status:   Wt Readings from Last 1 Encounters:  01/27/13 283 lb 8.2 oz (128.6 kg)  Down from 311 lbs on admission, related to fluids   Re-estimated needs:  Kcal: 2200-2400 Protein: 90-100 gm  Fluid: >/= 1.5 L   Skin: WOC notes reviewed, wounds to BLE related to venous insufficiency and bites.   Diet Order: Carb Control   Intake/Output Summary (Last 24 hours) at 01/27/13 1122 Last data filed at 01/27/13 0700  Gross per 24 hour  Intake   1310 ml  Output   3830 ml  Net  -2520 ml  -22 L this admission   Last BM: 6/21   Labs:   Recent Labs Lab 01/22/13 0500  01/24/13 0450 01/25/13 0455 01/26/13 1320 01/27/13 0407  NA 135  < > 136 134* 135 136  K 4.5  < > 4.8 4.0 3.9 3.7  CL 91*  < > 88* 88* 89* 91*  CO2 37*  < > 34* 43* 41* 40*  BUN 40*  < > 45* 42* 42* 41*  CREATININE 1.63*  < > 1.51* 1.46* 1.44* 1.46*  CALCIUM 9.4  < > 9.7 10.0 9.8 10.0  MG  --   --  2.1 2.0  --   --   PHOS 4.9*  --  4.4 5.0*  --   --   GLUCOSE 178*  < > 173* 223* 252* 228*  < > = values in this interval not displayed.  CBG (last 3)   Recent Labs  01/26/13 1736 01/26/13 2242 01/27/13 0801  GLUCAP 237* 238* 175*    Scheduled Meds: . albuterol  2.5 mg Nebulization Q6H  . antiseptic oral rinse  15 mL Mouth Rinse BID  . aspirin EC  81 mg Oral  Daily  . atorvastatin  20 mg Oral q1800  . cephALEXin  500 mg Oral Q8H  . heparin subcutaneous  5,000 Units Subcutaneous Q8H  . insulin aspart  0-20 Units Subcutaneous TID WC  . insulin aspart  0-5 Units Subcutaneous QHS  . ipratropium  0.5 mg Nebulization Q6H  . torsemide  20 mg Oral BID  . triamcinolone 0.1 % cream : eucerin  1 application Topical BID     Clarene Duke RD, LDN Pager [phone] After Hours pager [phone]

## 2013-01-27 NOTE — Progress Notes (Addendum)
PULMONARY  / CRITICAL CARE MEDICINE  Name: Cesar Cantrell MRN: [id number] DOB: 1965/10/05    ADMISSION DATE:  01/17/2013 CONSULTATION DATE:  01/17/13  REFERRING MD :  Dr. Diona Browner PRIMARY SERVICE:  Cardiology  CHIEF COMPLAINT:  Dyspnea       BRIEF PATIENT DESCRIPTION:  47 y.o. Obese white male, smoker with a history of inferior MI, 5 vessel CABG, type II DM, and hypertension presents from [facility] where he was found unresponsive with acute on chronic hypercarbic & hypoxic resp failure.  PCCM asked to consult on ventilatory support and pulmonary aspects of case.         SIGNIFICANT EVENTS / STUDIES:  6/13 Admitted in transfer from [facility] to Cards service 6/13 Echo: LVEF 55-60%, mild LA enlargement, mod RA enlargement, mild to mod RV dilatation, estimated RVSP 62 mmHg 6/16 RHC:  RV: 87/9 mm Hg, PA 84/81 mm Hg with mean 57 mm Hg, PCWP 25 mm Hg. 2.43 L/min/M2, severe pulmonary hypertension 6/19 BLE Doppler:  Negative for DVTs bilaterally  LINES / TUBES: Nasal ETT >> 6/14 RUE PICC 6/15 >>    CULTURES: 6/13 Strep Antigen >> NEG 6/13 Blood >>NGTD 6/13 Sputum >>NGTD    ANTIBIOTICS: 6/13 Vanc >>> 6/15 6/13 Levaquin >> 6/15 changed to PO >>6/19 6/20 Keflex (cellulitis) >> (planned 6/26)   SUBJECTIVE:  Feels better.  Wants to know when he is ready to go home.      VITAL SIGNS: Temp:  [97.7 F (36.5 C)-98.5 F (36.9 C)] 97.7 F (36.5 C) (06/23 1200) Pulse Rate:  [71-94] 76 (06/23 1200) Resp:  [13-23] 16 (06/23 1200) BP: (135-161)/(54-86) 136/54 mmHg (06/23 1200) SpO2:  [91 %-100 %] 99 % (06/23 1200) Weight:  [128.6 kg (283 lb 8.2 oz)] 128.6 kg (283 lb 8.2 oz) (06/23 0600)  PHYSICAL EXAMINATION: General: no distress Neuro:  Alert, normal strength HEENT: no sinus tenderness Cardiovascular: regular Lungs: Diminished breath sounds at bases bilaterally, resps even non labored  Abdomen:  Obese, soft, NT Ext: 2+ LE edema, legs dressed in gauze bilaterally.   Recent  Labs Lab 01/25/13 0455 01/26/13 1320 01/27/13 0407  NA 134* 135 136  K 4.0 3.9 3.7  CL 88* 89* 91*  CO2 43* 41* 40*  BUN 42* 42* 41*  CREATININE 1.46* 1.44* 1.46*  GLUCOSE 223* 252* 228*    Recent Labs Lab 01/20/13 1500 01/21/13 0500 01/27/13 0407  HGB 14.7 15.4 15.1  HCT 45.4 47.0 46.1  WBC 10.9* 11.2* 9.8  PLT 180 190 164    ABG    Component Value Date/Time   PHART 7.341* 01/26/2013 0254   PCO2ART 79.3* 01/26/2013 0254   PO2ART 76.0* 01/26/2013 0254   HCO3 43.0* 01/26/2013 0254   TCO2 45 01/26/2013 0254   O2SAT 93.0 01/26/2013 0254     CXR:  Dg Chest Port 1 View  01/26/2013   *RADIOLOGY REPORT*  Clinical Data: Follow up CHF  PORTABLE CHEST - 1 VIEW  Comparison: 01/22/2013  Findings: Right arm PICC line tip is in the cavoatrial junction. The lung volumes are low.  Status post median sternotomy CABG procedure.  Stable cardiac enlargement.  Mild increased and interstitial edema.  Small pleural effusions are stable.  IMPRESSION:  1.  Mild increase in interstitial edema pattern.   Original Report Authenticated By: Signa Kell, M.D.       ASSESSMENT / PLAN:  PULMONARY A:  Acute on chronic respiratory failure Chronic Respiratory Acidosis. Severe Pulmonary Hypertension by RHC-6/17 Hypoxia - ? Combination of  atelectasis & ? Obstructive lung disease with severe PH causing VQ mismatch Suspect decompensated OHS History compatible with OSA - not formally diagnosed Smoker  P:  Cont BDs mandatory bipap nocturnal - tolerating, will need home setup Cont [place] daytime, currently 6L Cont incentive spirometry Counseled re: smoking cessation Will need formal sleep eval & nocturnal BiPAP on discharge - his PCP is at [facility] Neg BLE dopplers, would defer any further PE workup  CARDIOVASCULAR A:  CAD H/O CHF Hyperlipidemia Cor pulmonale Abnormal aortic valve: Question of old vegetation Severe pulm hypertension - combination of hypoxia/OSA/ lung disease/ diastolic  dysfunction/ L sided disease (PAOP 25)  rt heart cath 6/16 >> Mean RA: 17 mmHg  RV: 87/9 mmHg  PA: 84/41 mmHg with a mean of 57 mmHg  PCWP: 25 mmHg  Fick Cardiac Output: 5.99 L/minute, 2.43 L/minute/M2  P:  - torsemide as ordered - not a good candidate for TEE given overall resp status, plan to defer for now  RENAL A:  Renal insufficiency Hypervolemia Hypochloremia 6/19 Met alkalosis due to loop diuresis P:   -follow SCr, Na, PO4, and Cl on Bmet -continue diamox for another day, assess for possible d/c on 6/24   GASTROINTESTINAL A:  Obesity P:   CHO mod diet   HEMATOLOGIC A:  No active issues P:  Monitor intermittently  INFECTIOUS A:   -LE Cellulitis/chronic venous stasis ulcers -Doubt CAP  P:   -levaquin x 7ds completed -cont PO Keflex for cellulitis coverage (self pay) for 7 days  ENDOCRINE A:  Type II DM   P:   -Cont SSI -needs to follow up with PCP for elevated A1C after acute illness resolves  NEUROLOGIC A:  No active issues P:   -continue to monitor closely  Transfer to floor bed 6/23  Levy Pupa, MD, PhD 01/27/2013, 2:13 PM  Pulmonary and Critical Care [phone] or if no answer [phone]

## 2013-01-27 NOTE — Evaluation (Signed)
Physical Therapy Evaluation Patient Details Name: Cesar Cantrell MRN: [id number] DOB: 1966-03-16 Today's Date: 01/27/2013 Time: 8295-6213 PT Time Calculation (min): 21 min  PT Assessment / Plan / Recommendation Clinical Impression  Pt is a 47 yo male admitted for resp failure presenting with generalized weakness, decreased endurance/activity tolerance and impaired balance. With acute PT anticipate pt to be safe for d/c back home with mother use potential use of RW. Acute PT to con't to work with pt while in hospital to address mentioned deficits and achieve safe mod I function for safe d/c home.    PT Assessment  Patient needs continued PT services    Follow Up Recommendations  Home health PT;Supervision/Assistance - 24 hour    Does the patient have the potential to tolerate intense rehabilitation      Barriers to Discharge Decreased caregiver support      Equipment Recommendations  Rolling walker with 5" wheels    Recommendations for Other Services     Frequency Min 3X/week    Precautions / Restrictions Precautions Precautions: Fall Restrictions Weight Bearing Restrictions: No   Pertinent Vitals/Pain Pt denies pain      Mobility  Bed Mobility Bed Mobility: Not assessed Transfers Transfers: Sit to Stand;Stand to Sit Sit to Stand: 4: Min guard Stand to Sit: 4: Min guard Details for Transfer Assistance: increased time, mild unsteadiness upon intial stand. wide base of support, definite use of UEs Ambulation/Gait Ambulation/Gait Assistance: 4: Min assist;4: Min guard Ambulation Distance (Feet): 120 Feet Assistive device: Rolling walker;None Ambulation/Gait Assistance Details: began to ambulate without RW however pt unsteady and requrested "just let me hold onto that for a minute" in reference to RW. Pt with increased stability with RW requiring min guard only. Re-attempted to ambulate without RW, pt with lateraly sway resulting in cross-over gait to catch balance with  minA from PT. pt denies dizziness/lightheadedness. Gait Pattern: Step-through pattern;Decreased stride length;Wide base of support Gait velocity: decreased General Gait Details: increased stability/safety with RW Stairs: No    Exercises     PT Diagnosis: Generalized weakness;Difficulty walking  PT Problem List: Decreased strength;Decreased activity tolerance;Decreased balance PT Treatment Interventions: DME instruction;Gait training;Stair training;Functional mobility training;Therapeutic activities;Therapeutic exercise;Balance training   PT Goals Acute Rehab PT Goals PT Goal Formulation: With patient Time For Goal Achievement: 02/10/13 Potential to Achieve Goals: Good  Visit Information  Last PT Received On: 01/27/13 Assistance Needed: +1 (2nd person nice for chair follow and lines)    Subjective Data  Subjective: Pt received sitting up in chair agreeable to PT. Pt on 5LO2 via [place]   Prior Functioning  Home Living Lives With: Family Available Help at Discharge: Family;Available PRN/intermittently Type of Home: House Home Access: Stairs to enter [employer] of Steps: 3 Entrance Stairs-Rails: Can reach both Home Layout: Two level Alternate Level Stairs-Number of Steps: 1 Bathroom Shower/Tub: Tub/shower unit;Door [employer] Toilet: Standard Home Adaptive Equipment: Hand-held shower hose Prior Function Level of Independence: Independent Able to Take Stairs?: Yes Driving: Yes Vocation: Part time employment Communication Communication: No difficulties Dominant Hand: Right    Cognition  Cognition Arousal/Alertness: Awake/alert Behavior During Therapy: WFL for tasks assessed/performed Overall Cognitive Status: Within Functional Limits for tasks assessed    Extremity/Trunk Assessment Right Upper Extremity Assessment RUE ROM/Strength/Tone: [facility] for tasks assessed Left Upper Extremity Assessment LUE ROM/Strength/Tone: WFL for tasks assessed Right Lower Extremity  Assessment RLE ROM/Strength/Tone: WFL for tasks assessed (noted edema/swelling) Left Lower Extremity Assessment LLE ROM/Strength/Tone: WFL for tasks assessed (noted edema swelling) Trunk Assessment  Trunk Assessment: Normal   Balance    End of Session PT - End of Session Equipment Utilized During Treatment: Gait belt;Oxygen Activity Tolerance: Patient tolerated treatment well Patient left: in chair;with call bell/phone within reach Nurse Communication: Mobility status  GP     Marcene Brawn 01/27/2013, 10:22 AM  Lewis Shock, PT, DPT Pager #: [phone] Office #: [phone]

## 2013-01-27 NOTE — Progress Notes (Signed)
Attempted x3 to place patient on bipap for HS.  Patient states that he is not ready for bed.  Patient was given Rt phone number to call when ready for bed.  BIPAP equipment, procedure, and importance discussed with patient at length.  Patient is currently on 4L [place], sats 9%.  RN aware.

## 2013-01-28 LAB — BASIC METABOLIC PANEL
BUN: 39 mg/dL — ABNORMAL HIGH (ref 6–23)
CO2: 37 mEq/L — ABNORMAL HIGH (ref 19–32)
Calcium: 10.1 mg/dL (ref 8.4–10.5)
Chloride: 90 mEq/L — ABNORMAL LOW (ref 96–112)
Creatinine, Ser: 1.38 mg/dL — ABNORMAL HIGH (ref 0.50–1.35)

## 2013-01-28 LAB — GLUCOSE, CAPILLARY
Glucose-Capillary: 251 mg/dL — ABNORMAL HIGH (ref 70–99)
Glucose-Capillary: 194 mg/dL — ABNORMAL HIGH (ref 70–99)

## 2013-01-28 NOTE — Progress Notes (Signed)
Pt does not wish to wear Bipap at this time. Pt states it was uncomfortable on previous nights. RT will attempt to place on at a later time. Pt is no distress at this time. RN aware.

## 2013-01-28 NOTE — Progress Notes (Signed)
PULMONARY  / CRITICAL CARE MEDICINE  Name: Cesar Cantrell MRN: [id number] DOB: April 07, 1966    ADMISSION DATE:  01/17/2013 CONSULTATION DATE:  01/17/13  REFERRING MD :  Dr. Diona Browner PRIMARY SERVICE:  Cardiology  CHIEF COMPLAINT:  Dyspnea       BRIEF PATIENT DESCRIPTION:  47 y.o. Obese white male, smoker with a history of inferior MI, 5 vessel CABG, type II DM, and hypertension presents from [facility] where he was found unresponsive with acute on chronic hypercarbic & hypoxic resp failure.  PCCM asked to consult on ventilatory support and pulmonary aspects of case.         SIGNIFICANT EVENTS / STUDIES:  6/13 Admitted in transfer from [facility] to Cards service 6/13 Echo: LVEF 55-60%, mild LA enlargement, mod RA enlargement, mild to mod RV dilatation, estimated RVSP 62 mmHg 6/16 RHC:  RV: 87/9 mm Hg, PA 84/81 mm Hg with mean 57 mm Hg, PCWP 25 mm Hg. 2.43 L/min/M2, severe pulmonary hypertension 6/19 BLE Doppler:  Negative for DVTs bilaterally  LINES / TUBES: Nasal ETT >> 6/14 RUE PICC 6/15 >>    CULTURES: 6/13 Strep Antigen >> NEG 6/13 Blood >>NGTD 6/13 Sputum >>NGTD    ANTIBIOTICS: 6/13 Vanc >>> 6/15 6/13 Levaquin >> 6/15 changed to PO >>6/19 6/20 Keflex (cellulitis) >> (planned 6/26)   SUBJECTIVE:  Feels better.  Wants to know when he is ready to go home. Up in chair      VITAL SIGNS: Temp:  [97.7 F (36.5 C)-98.7 F (37.1 C)] 97.8 F (36.6 C) (06/24 0736) Pulse Rate:  [74-83] 81 (06/24 0736) Resp:  [14-25] 18 (06/24 0736) BP: (134-172)/(54-76) 158/59 mmHg (06/24 0736) SpO2:  [97 %-100 %] 98 % (06/24 0736) Weight:  [128.8 kg (283 lb 15.2 oz)] 128.8 kg (283 lb 15.2 oz) (06/24 0441)  PHYSICAL EXAMINATION: General: no distress Neuro:  Alert, normal strength HEENT: no sinus tenderness Cardiovascular: regular Lungs: Diminished breath sounds at bases bilaterally, resps even non labored  Abdomen:  Obese, soft, NT Ext: 2+ LE edema, legs dressed in gauze  bilaterally.   Recent Labs Lab 01/26/13 1320 01/27/13 0407 01/28/13 0450  NA 135 136 136  K 3.9 3.7 4.0  CL 89* 91* 90*  CO2 41* 40* 37*  BUN 42* 41* 39*  CREATININE 1.44* 1.46* 1.38*  GLUCOSE 252* 228* 193*    Recent Labs Lab 01/27/13 0407  HGB 15.1  HCT 46.1  WBC 9.8  PLT 164    ABG    Component Value Date/Time   PHART 7.341* 01/26/2013 0254   PCO2ART 79.3* 01/26/2013 0254   PO2ART 76.0* 01/26/2013 0254   HCO3 43.0* 01/26/2013 0254   TCO2 45 01/26/2013 0254   O2SAT 93.0 01/26/2013 0254     CXR:  No results found.     ASSESSMENT / PLAN:  PULMONARY A:  Acute on chronic respiratory failure Chronic Respiratory Acidosis. Severe Pulmonary Hypertension by RHC-6/17 Hypoxia - ? Combination of atelectasis & ? Obstructive lung disease with severe PH causing VQ mismatch Suspect decompensated OHS History compatible with OSA - not formally diagnosed Smoker  P:  Cont BDs mandatory bipap nocturnal - tolerating, will need home setup Cont [place] daytime, currently 6L Cont incentive spirometry Counseled re: smoking cessation Will need formal sleep eval & nocturnal BiPAP on discharge - his PCP is at [facility] Neg BLE dopplers, would defer any further PE workup  CARDIOVASCULAR A:  CAD H/O CHF Hyperlipidemia Cor pulmonale Abnormal aortic valve: Question of old vegetation Severe pulm  hypertension - combination of hypoxia/OSA/ lung disease/ diastolic dysfunction/ L sided disease (PAOP 25)  rt heart cath 6/16 >> Mean RA: 17 mmHg  RV: 87/9 mmHg  PA: 84/41 mmHg with a mean of 57 mmHg  PCWP: 25 mmHg  Fick Cardiac Output: 5.99 L/minute, 2.43 L/minute/M2  P:  - torsemide as ordered - other meds per cardiology - note he is no longer on ACE-I, coreg,  - not a good candidate for TEE given overall resp status, plan to defer for now  RENAL A:  Renal insufficiency Hypervolemia Hypochloremia 6/19 Met alkalosis due to loop diuresis P:   -follow SCr, Na, PO4, and Cl on  Bmet -continue diamox for another day, assess for possible d/c on 6/25 at time of hospital discharge  GASTROINTESTINAL A:  Obesity P:   CHO mod diet   HEMATOLOGIC A:  No active issues P:  Monitor intermittently  INFECTIOUS A:   -LE Cellulitis/chronic venous stasis ulcers -Doubt CAP  P:   -levaquin x 7ds completed -cont PO Keflex for cellulitis coverage (self pay) for 7 days  ENDOCRINE A:  Type II DM   P:   -Cont SSI -restart home DM regimen 6/24 -needs to follow up with PCP for elevated A1C after acute illness resolves  NEUROLOGIC A:  No active issues P:   -continue to monitor closely  Transfer to floor bed 6/24, possibly to home 6/24  Levy Pupa, MD, PhD 01/28/2013, 10:20 AM [place] [facility] [phone] or if no answer [phone]

## 2013-01-28 NOTE — Progress Notes (Signed)
Physical Therapy Treatment Patient Details Name: Cesar Cantrell MRN: [id number] DOB: April 11, 1966 Today's Date: 01/28/2013 Time: 1115-1130 PT Time Calculation (min): 15 min  PT Assessment / Plan / Recommendation Comments on Treatment Session  Pt demo'd improved ambulation tolerance this date as well as stabiltiy with ambulation. Con't to recommend HHPT and use of RW for safe d/c home.    Follow Up Recommendations  Home health PT;Supervision/Assistance - 24 hour     Does the patient have the potential to tolerate intense rehabilitation     Barriers to Discharge        Equipment Recommendations  Rolling walker with 5" wheels    Recommendations for Other Services    Frequency Min 3X/week   Plan Discharge plan remains appropriate;Frequency remains appropriate    Precautions / Restrictions Precautions Precautions: Fall Restrictions Weight Bearing Restrictions: No   Pertinent Vitals/Pain Pt denies pain    Mobility  Bed Mobility Bed Mobility: Not assessed Transfers Transfers: Sit to Stand;Stand to Sit Sit to Stand: 5: Supervision Stand to Sit: 5: Supervision Details for Transfer Assistance:  (improved steadiness. pt safe to stand and use urinal) Ambulation/Gait Ambulation/Gait Assistance: 4: Min guard Ambulation Distance (Feet): 250 Feet Assistive device: Rolling walker Ambulation/Gait Assistance Details: pt with improved ambulation tolerance this date. con't to require RW for stability Gait Pattern: Step-through pattern;Decreased stride length;Wide base of support Gait velocity: decreased General Gait Details: no episodes of LOB Stairs: No    Exercises     PT Diagnosis:    PT Problem List:   PT Treatment Interventions:     PT Goals Acute Rehab PT Goals Pt will go Supine/Side to Sit: with modified independence;with HOB 0 degrees Pt will go Sit to Stand: with modified independence;with upper extremity assist (up to RW) Pt will Ambulate: with modified  independence;with rolling walker;>150 feet Pt will Go Up / Down Stairs: 1-2 stairs;with supervision;with rolling walker  Visit Information  Last PT Received On: 01/28/13 Assistance Needed: +1    Subjective Data  Subjective: Pt received sitting up in chair agreeable to PT. Pt on 5LO2 via [place]   Cognition  Cognition Arousal/Alertness: Awake/alert Behavior During Therapy: WFL for tasks assessed/performed Overall Cognitive Status: Within Functional Limits for tasks assessed    Balance     End of Session PT - End of Session Equipment Utilized During Treatment: Gait belt;Oxygen Activity Tolerance: Patient tolerated treatment well Patient left: in chair;with call bell/phone within reach Nurse Communication: Mobility status   GP     Marcene Brawn 01/28/2013, 1:27 PM  Lewis Shock, PT, DPT Pager #: [phone] Office #: [phone]

## 2013-01-28 NOTE — Progress Notes (Signed)
Patient ID: Cesar Cantrell, male   DOB: 15-Feb-1966, 47 y.o.   MRN: [id number]    SUBJECTIVE:   He continues to diurese well, now on po torsemide. Breathing better.  He continues to diurese, stable creatinine.  RHC Mean RA: 17 mmHg  RV: 87/9 mmHg  PA: 84/41 mmHg with a mean of 57 mmHg  PCWP: 25 mmHg  Fick Cardiac Output: 5.99 L/minute, 2.43 L/minute/M2 PVR: 5.3 [place]   Echocardiogram report summarized here:  Conclusions:  1. LV 55-60% + diastolid dysfx 2. RV is mild to moderately dilated. The right  ventricular global systolic function is moderately reduced.  3. The right atrium is moderately dilated. The interatrial septum  bowed toward the left consistent with elevated right atrial pressure.  4. There is mild to moderate tricuspid regurgitation.  The right ventricular systolic pressure is calculated at 62 mmHg.  There is evidence of moderate to severe pulmonary hypertension.     Marland Kitchen albuterol  2.5 mg Nebulization Q6H  . aspirin EC  81 mg Oral Daily  . atorvastatin  20 mg Oral q1800  . cephALEXin  500 mg Oral Q8H  . heparin subcutaneous  5,000 Units Subcutaneous Q8H  . insulin aspart  0-20 Units Subcutaneous TID WC  . insulin aspart  0-5 Units Subcutaneous QHS  . ipratropium  0.5 mg Nebulization Q6H  . torsemide  20 mg Oral BID  . triamcinolone 0.1 % cream : eucerin  1 application Topical BID      Filed Vitals:   01/28/13 0211 01/28/13 0215 01/28/13 0400 01/28/13 0441  BP:   161/76   Pulse: 83  74   Temp:   97.8 F (36.6 C)   TempSrc:   Oral   Resp: 25  14   Height:      Weight:    283 lb 15.2 oz (128.8 kg)  SpO2: 97% 97% 97%     Intake/Output Summary (Last 24 hours) at 01/28/13 0721 Last data filed at 01/28/13 0600  Gross per 24 hour  Intake    840 ml  Output   4800 ml  Net  -3960 ml    LABS: Basic Metabolic Panel:  Recent Labs  62/95/28 0407 01/28/13 0450  NA 136 136  K 3.7 4.0  CL 91* 90*  CO2 40* 37*  GLUCOSE 228* 193*  BUN 41* 39*    CREATININE 1.46* 1.38*  CALCIUM 10.0 10.1   Liver Function Tests: No results found for this basename: AST, ALT, ALKPHOS, BILITOT, PROT, ALBUMIN,  in the last 72 hours No results found for this basename: LIPASE, AMYLASE,  in the last 72 hours CBC:  Recent Labs  01/27/13 0407  WBC 9.8  HGB 15.1  HCT 46.1  MCV 79.6  PLT 164   Cardiac Enzymes: No results found for this basename: CKTOTAL, CKMB, CKMBINDEX, TROPONINI,  in the last 72 hours BNP: No components found with this basename: POCBNP,  D-Dimer: No results found for this basename: DDIMER,  in the last 72 hours Hemoglobin A1C: No results found for this basename: HGBA1C,  in the last 72 hours Fasting Lipid Panel: No results found for this basename: CHOL, HDL, LDLCALC, TRIG, CHOLHDL, LDLDIRECT,  in the last 72 hours Thyroid Function Tests: No results found for this basename: TSH, T4TOTAL, FREET3, T3FREE, THYROIDAB,  in the last 72 hours Anemia Panel: No results found for this basename: VITAMINB12, FOLATE, FERRITIN, TIBC, IRON, RETICCTPCT,  in the last 72 hours  RADIOLOGY: Dg Chest Port 1 [address]  01/17/2013   *RADIOLOGY REPORT*  Clinical Data: Left lower lobe infiltrate.  Intubation.  PORTABLE CHEST - 1 VIEW  Comparison: One-view chest 01/17/2013.  Findings: The patient is now intubated.  Endotracheal tube terminates 4.5 cm above the carina, at the level of the clavicles. Cardiac enlargement is stable.  Moderate pulmonary vascular congestion has increased slightly.  Bibasilar airspace disease is evident, worse on the left.  A small left pleural effusion is suspected.  IMPRESSION:  1.  Satisfactory positioning of the endotracheal tube. 2.  Stable cardiomegaly with increasing pulmonary vascular congestion, concerning for congestive heart failure. 3.  Bibasilar airspace disease likely reflects atelectasis, infection is not excluded. 4.  Probable small left pleural effusion.   Original Report Authenticated By: Marin Roberts, M.D.     PHYSICAL EXAM General: Sitting in chair. coughing Neuro: A & O x 3 HEENT: Bloodshot eyes Neck: Thick JVP 8-9 cm, no thyromegaly or thyroid nodule.  Lungs:decreased throughout no wheeze CV: Nondisplaced PMI.  Heart regular S1/S2, 2/6 TR Abdomen: Soft, obese, no distention.  Extremities: No clubbing or cyanosis. 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  1.Acute Respiratory failure.  Hypercarbic/hypoxic respiratory failure in the setting OHS/OSA exacerbation and acute on chronic diastolic CHF.  He denies use of CPAP or home oxygen at home, he likely will need both.   2. 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   3. Abnormal aortic valve: Question of old vegetation.  He is afebrile.  For now, would avoid TEE with respiratory failure.  4. CAD s/p CABG: Doubt ACS.  Continue statin, ASA.   5. AKI: recheck BMET today 6. 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  Diurese and treat with oxygen and CPAP.  7. Acute cor pulmonale 8. Tobacco use - ongoing 9. COPD   Summary: Suspect PAH mostly due to hypoxic vasoconstriction in setting of COPD, OHS, COPD with smaller component of diastolic dysfunction. RHF/volume status much improved. Now on po torsemide.  Stable from cardiac standpoint, I think he can likely go home today on current cardiac meds.   Needs close cardiology followup after discharge.  Will likely need repeat RHC to assess for need for selective pulm vasodilators - though I think the most important thing for his PH is to optimize his pulmonary status.   I feel he is too high risk for TEE due to resp status. Would follow clinically.   Marca Ancona 01/28/2013 7:21 AM

## 2013-01-28 NOTE — Progress Notes (Signed)
Pt. O2 sat on RA while at rest, sitting in the chair, went down to 79%.

## 2013-01-28 NOTE — Progress Notes (Signed)
Inpatient Diabetes Program Recommendations  AACE/ADA: New Consensus Statement on Inpatient Glycemic Control (2013)  Target Ranges:  Prepandial:   less than 140 mg/dL      Peak postprandial:   less than 180 mg/dL (1-2 hours)      Critically ill patients:  140 - 180 mg/dL  Results for JIM, LUNDIN (MRN [id number]) as of 01/28/2013 10:43  Ref. Range 01/27/2013 08:01 01/27/2013 12:07 01/27/2013 16:23 01/27/2013 21:23 01/28/2013 07:41  Glucose-Capillary Latest Range: 70-99 mg/dL 409 (H) 811 (H) 914 (H) 207 (H) 205 (H)    Inpatient Diabetes Program Recommendations Insulin - Basal: consider adding basal Levemir or Lantus 20 units  HgbA1C: =8.4 Thank you  Piedad Climes BSN, RN,CDE Inpatient Diabetes Coordinator [phone] (team pager)

## 2013-01-29 LAB — BASIC METABOLIC PANEL
Chloride: 91 mEq/L — ABNORMAL LOW (ref 96–112)
GFR calc Af Amer: 74 mL/min — ABNORMAL LOW (ref >90)
GFR calc non Af Amer: 64 mL/min — ABNORMAL LOW (ref >90)
Potassium: 4 mEq/L (ref 3.5–5.1)
Sodium: 137 mEq/L (ref 135–145)

## 2013-01-29 LAB — GLUCOSE, CAPILLARY
Glucose-Capillary: 197 mg/dL — ABNORMAL HIGH (ref 70–99)
Glucose-Capillary: 219 mg/dL — ABNORMAL HIGH (ref 70–99)

## 2013-01-29 MED ORDER — TORSEMIDE 20 MG PO TABS: 20.0000 mg | ORAL_TABLET | Freq: Two times a day (BID) | ORAL | Status: DC

## 2013-01-29 MED ORDER — ALBUTEROL SULFATE HFA 108 (90 BASE) MCG/ACT IN AERS: 2.0000 | INHALATION_SPRAY | Freq: Four times a day (QID) | RESPIRATORY_TRACT | Status: DC | PRN

## 2013-01-29 NOTE — Progress Notes (Signed)
Pt does not wish to go on Bipap at this time. No distress noted. Bipap in room on standby. Pt in no distress

## 2013-01-29 NOTE — Progress Notes (Signed)
Occupational Therapy Treatment Patient Details Name: Cesar Cantrell MRN: [id number] DOB: November 07, 1965 Today's Date: 01/29/2013 Time: 7829-5621 OT Time Calculation (min): 29 min  OT Assessment / Plan / Recommendation  OT comments  Pt still with significant decrease in his O2 sats with activity, down to 82% after mobilizing in the hallway on 4Ls nasal cannula.  Pt currently supervision level for selfcare tasks and mobility.  Encouraged use of a shower seat as well as the RW for safety at home.  Pt given energy conservation strategies handout for reference as well.  Follow Up Recommendations  No OT follow up       Equipment Recommendations  None recommended by OT       Frequency Min 2X/week   Progress towards OT Goals Progress towards OT goals: Progressing toward goals  Plan Discharge plan remains appropriate    Precautions / Restrictions Precautions Precautions: Fall Restrictions Weight Bearing Restrictions: No   Pertinent Vitals/Pain O2 sats 90% on 4Ls at rest, decreasing to 82 on 4Ls with activity    ADL  Grooming: Simulated;Supervision/safety Where Assessed - Grooming: Unsupported standing Toilet Transfer: Buyer, retail Method: Other (comment) (ambulate without assistive device) Toilet Transfer Equipment: Regular height toilet Toileting - Clothing Manipulation and Hygiene: Simulated;Supervision/safety Where Assessed - Toileting Clothing Manipulation and Hygiene: Sit to stand from 3-in-1 or toilet Equipment Used: Gait belt Transfers/Ambulation Related to ADLs: Pt currently min guard assist for mobility without use of assistive device.   ADL Comments: Educated pt and his mother on energy conservation strategies for selfcare tasks and provided handout.  Also reinforced the need for him to use the RW initially at discharge as well as shower seat for safety.  Pt will need O2 at all times. During session O2 sats decreased to 82% on 4Ls nasal cannula.   Took approximately 1 minute of purse lipped breathing to increase oxygen to 90 %.        OT Goals(current goals can now be found in the care plan section) Miscellaneous OT Goals Miscellaneous OT Goal #1: Pt will be aware of energy conservation stratigies that may be helpful for him from energy conservation handout. OT Goal: Miscellaneous Goal #1 - Progress: Met  Visit Information  Last OT Received On: 01/29/13 Assistance Needed: +1          Cognition  Cognition Arousal/Alertness: Awake/alert Behavior During Therapy: WFL for tasks assessed/performed Overall Cognitive Status: Within Functional Limits for tasks assessed    Mobility  Bed Mobility Bed Mobility: Supine to Sit Supine to Sit: 7: Independent Transfers Transfers: Sit to Stand;Stand to Sit Sit to Stand: 5: Supervision;From bed;With upper extremity assist Stand to Sit: 5: Supervision;Without upper extremity assist;To chair/3-in-1       Balance Balance Balance Assessed: Yes Dynamic Standing Balance Dynamic Standing - Balance Support: No upper extremity supported Dynamic Standing - Level of Assistance: 5: Stand by assistance;Other (comment) (min guard assist for mobility)   End of Session OT - End of Session Equipment Utilized During Treatment: Gait belt;Oxygen Activity Tolerance: Patient limited by fatigue Patient left: in bed;with family/visitor present;with nursing/sitter in room Nurse Communication: Mobility status     Tiajah Oyster OTR/L Pager number [phone] 01/29/2013, 1:53 PM

## 2013-01-29 NOTE — Discharge Summary (Signed)
Physician Discharge Summary  Patient ID: Cesar Cantrell MRN: [id number] DOB/AGE: 1965/09/04 47 y.o.  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 Initial ejection fraction 39% by cardiac  catheterization, but subsequent normalization, as assessed by most recent  echocardiogram (EF 55% to 60%), in 2011.  Patient was most recently seen for routine followup in our [facility] office  in March of this year, by Cesar Cantrell. He was noted to still smoke tobacco,  but otherwise was in stable condition from a cardiovascular standpoint,  reporting no ischemic symptoms. No medication adjustments were recommended.  Patient was admitted directly from Cesar Cantrell office yesterday for further  evaluation and management of marked bilateral lower extremity edema and  erythema, worrisome for cellulitis. He was placed on IV Lasix and had a good  negative diuretic response over the last 24 hours. There was also concern for  cor pulmonale and right heart failure. An echocardiogram was performed, with  results currently pending. Patient was also placed on IV antibiotics for  treatment of possible cellulitis.  Earlier this morning, patient developed respiratory failure, with corresponding  ABG suggestive of uncompensated respiratory acidosis: pH 7.21, pCO2 98, pO2 63,  and bicarb 39, on 5L. Patient was transferred directly to the intensive care  unit, where he is currently on BiPAP.  In terms of symptoms, he denies any current or  recent chest pain or shortness  of breath. He suggests that the reason he was seen in the office yesterday was  for his peripheral edema. He also is noted to have a rash, but states that he  has had it for "30 years."  Hospital Course:  SIGNIFICANT EVENTS / STUDIES:  6/13 Admitted in transfer from [facility] to Cards service  6/13 Echo: LVEF 55-60%, mild LA enlargement, mod RA enlargement, mild to mod RV dilatation, estimated RVSP 62 mmHg  6/16 RHC: RV: 87/9 mm Hg, PA 84/81 mm Hg with mean 57 mm Hg, PCWP 25 mm Hg. 2.43 L/min/M2, severe pulmonary hypertension  6/19 BLE Doppler: Negative for DVTs bilaterally  LINES / TUBES:  Nasal ETT >> 6/14  RUE PICC 6/15 >>  CULTURES:  6/13 Strep Antigen >> NEG  6/13 Blood >>NGTD  6/13 Sputum >>NGTD  ANTIBIOTICS:  6/13 Vanc >>> 6/15  6/13 Levaquin >> 6/15 changed to PO >>6/19  6/20 Keflex (cellulitis) >> (planned 6/25)   ASSESSMENT / PLAN:  PULMONARY  A: Acute on chronic respiratory failure  Chronic Respiratory Acidosis.  Severe Pulmonary Hypertension by RHC-6/17  Hypoxia - ? Combination of atelectasis & ? Obstructive lung disease with severe PH causing VQ mismatch  Suspect decompensated OHS  History compatible with OSA - not formally diagnosed  Smoker  P:  Cont BDs  mandatory bipap nocturnal - tolerating, will need home setup  Cont Glenn daytime, currently 6L  Cont incentive spirometry  Counseled re: smoking cessation  Will need formal sleep eval & nocturnal BiPAP on discharge - [facility] PCP [facility]  Neg BLE dopplers, would defer any further PE workup  CARDIOVASCULAR  A:  CAD  H/O CHF  Hyperlipidemia  Cor pulmonale  Abnormal aortic valve: Question of old vegetation  Severe pulm hypertension - combination of hypoxia/OSA/ lung disease/ diastolic dysfunction/ L sided disease (PAOP 25)  rt heart cath 6/16 >>  Mean RA: 17 mmHg  RV: 87/9 mmHg  PA: 84/41 mmHg with a mean of 57 mmHg  PCWP: 25 mmHg  Fick Cardiac Output: 5.99 L/minute, 2.43  L/minute/M2  P:  - torsemide as ordered  - other meds per cardiology - note he is no longer on ACE-I, coreg,  - not a good candidate for TEE given overall resp status, plan to defer for now  RENAL  A: Renal insufficiency  Hypervolemia  Hypochloremia 6/19  Met alkalosis due to loop diuresis  P:  -follow SCr, Na, PO4, and Cl on Bmet  -continue diamox for another day, assess for possible d/c on 6/25 at time of hospital discharge  GASTROINTESTINAL  A: Obesity  P:  CHO mod diet  HEMATOLOGIC  A: No active issues  P:  Monitor intermittently  INFECTIOUS  A:  -LE Cellulitis/chronic venous stasis ulcers  -Doubt CAP  P:  -levaquin x 7ds completed  -cont PO Keflex for cellulitis coverage (self pay) for 7 days  ENDOCRINE  A: Type II DM  P:  -Cont SSI  -restart home DM regimen 6/24  -needs to follow up with PCP for elevated A1C after acute illness resolves  NEUROLOGIC  A: No active issues  P:  -continue to monitor closely        Labs at discharge Lab Results  Component Value Date   CREATININE 1.32 01/29/2013   BUN 36* 01/29/2013   NA 137 01/29/2013   K 4.0 01/29/2013   CL 91* 01/29/2013   CO2 38* 01/29/2013   Lab Results  Component Value Date   WBC 9.8 01/27/2013   HGB 15.1 01/27/2013   HCT 46.1 01/27/2013   MCV 79.6 01/27/2013   PLT 164 01/27/2013   Lab Results  Component Value Date   ALT 12 01/17/2013   AST 10 01/17/2013   ALKPHOS 91 01/17/2013   BILITOT 0.3 01/17/2013   No results found for this basename: INR,  PROTIME    Current radiology studies No results found.  Disposition:         Future Appointments Provider Department Dept Phone   02/10/2013 9:00 AM Cesar Milch, MD [facility] [phone]   02/20/2013 8:00 PM [facility] 3 [facility] [phone]   04/28/2013 8:20 AM Cesar Sidle, MD [place] [facility] [phone]       Medication List    STOP taking these medications        carvedilol 12.5 MG tablet  Commonly known as:  COREG     lisinopril 10 MG tablet  Commonly known as:  PRINIVIL,ZESTRIL      TAKE these medications       albuterol 108 (90 BASE) MCG/ACT inhaler  Commonly known as:  PROVENTIL HFA;VENTOLIN HFA  Inhale 2 puffs into the lungs every 6 (six) hours as needed for wheezing.     aspirin EC 81 MG tablet  Take 81 mg by mouth every morning.     glimepiride 4 MG tablet  Commonly known as:  AMARYL  Take 4 mg by mouth 2 (two) times daily with a meal.     metFORMIN 1000 MG tablet  Commonly known as:  GLUCOPHAGE  Take 1,000 mg by mouth 2 (two) times  daily with a meal.     simvastatin 80 MG tablet  Commonly known as:  ZOCOR  Take 40 mg by mouth at bedtime.     torsemide 20 MG tablet  Commonly known as:  DEMADEX  Take 1 tablet (20 mg total) by mouth 2 (two) times daily.       Follow-up Information   Follow up with [facility]. (July 17th at 8pm - can leave on July 18th between 5:30am and 6am.  They will send you a packet in the mail. )    Contact information:   [address] [place] [address] [phone]      Follow up On 02/10/2013. (9:00 am)        Discharged Condition: fair  Time spent on discharge greater than 40 minutes.  Vital signs at Discharge. Temp:  [97.3 F (36.3 C)-98.8 F (37.1 C)] 97.8 F (36.6 C) (06/25 0739) Pulse Rate:  [77-103] 80 (06/25 0800) Resp:  [12-25] 20 (06/25 0800) BP: (135-161)/(54-72) 135/65 mmHg (06/25 0739) SpO2:  [79 %-100 %] 100 % (06/25 0739) FiO2 (%):  [92 %] 92 % (06/24 1510) Weight:  [129.3 kg (285 lb 0.9 oz)] 129.3 kg (285 lb 0.9 oz) (06/25 0500) Office follow up Special Information or instructions. Follow up with Dr. Vassie Loll. Note left off Coreg and lisinopril. Signed: Brett Canales Minor ACNP [facility] PCCM Pager [phone] till 3 pm If no answer page [phone] 01/29/2013, 11:16 AM   Levy Pupa, MD, PhD 01/29/2013, 11:17 AM [facility] [phone] or if no answer [phone]

## 2013-02-04 ENCOUNTER — Encounter: Payer: Self-pay | Admitting: Cardiology

## 2013-02-10 ENCOUNTER — Ambulatory Visit (INDEPENDENT_AMBULATORY_CARE_PROVIDER_SITE_OTHER): Payer: Medicaid - Out of State | Admitting: Pulmonary Disease

## 2013-02-10 ENCOUNTER — Encounter: Payer: Self-pay | Admitting: Pulmonary Disease

## 2013-02-10 ENCOUNTER — Other Ambulatory Visit (INDEPENDENT_AMBULATORY_CARE_PROVIDER_SITE_OTHER): Payer: Medicaid - Out of State

## 2013-02-10 VITALS — BP 134/74 | HR 87 | Temp 97.7°F | Ht 69.0 in | Wt 282.2 lb

## 2013-02-10 DIAGNOSIS — I279 Pulmonary heart disease, unspecified: Secondary | ICD-10-CM

## 2013-02-10 DIAGNOSIS — I2781 Cor pulmonale (chronic): Secondary | ICD-10-CM

## 2013-02-10 DIAGNOSIS — J9600 Acute respiratory failure, unspecified whether with hypoxia or hypercapnia: Secondary | ICD-10-CM

## 2013-02-10 DIAGNOSIS — E662 Morbid (severe) obesity with alveolar hypoventilation: Secondary | ICD-10-CM

## 2013-02-10 DIAGNOSIS — J449 Chronic obstructive pulmonary disease, unspecified: Secondary | ICD-10-CM

## 2013-02-10 LAB — BASIC METABOLIC PANEL
BUN: 12 mg/dL (ref 6–23)
CO2: 33 mEq/L — ABNORMAL HIGH (ref 19–32)
Chloride: 96 mEq/L (ref 96–112)
GFR: 84.08 mL/min (ref >60.00)
Glucose, Bld: 212 mg/dL — ABNORMAL HIGH (ref 70–99)
Potassium: 4.3 mEq/L (ref 3.5–5.1)
Sodium: 140 mEq/L (ref 135–145)

## 2013-02-10 NOTE — Assessment & Plan Note (Addendum)
PFT with post BD testing - No significant obstruction, hold off BDs& consider pulm rehab program - he wants to hold off until insurance issues sorted out Smoking cessation emphasised COntinues to need O2 2-4 L O2 on ambulation

## 2013-02-10 NOTE — Patient Instructions (Addendum)
Breathing test showed lung capapacity at 60% Blood work today Stay on same dose of demadex Sleep study on 02/20/13 will decide level of biPAP & oxygen

## 2013-02-10 NOTE — Assessment & Plan Note (Addendum)
Sleep study on 02/20/13 will decide level of biPAP & oxygen chk donwload

## 2013-02-10 NOTE — Assessment & Plan Note (Signed)
BMET today Stay on same dose of demadex

## 2013-02-10 NOTE — Progress Notes (Signed)
Spirometry before and after done today. 

## 2013-02-10 NOTE — Progress Notes (Signed)
Subjective:    Patient ID: Cesar Cantrell, male    DOB: 1966/06/02, 47 y.o.   MRN: [id number]  HPI PCP  -Griselda Miner   47 year old obese smoker, Adm 6/13-6/25/14  directly from PCP's office marked bilateral lower extremity edema and  erythema, worrisome for cellulitis.There was also concern for cor pulmonale and right heart failure. He developed acute respiratory failure, with ABG suggestive of uncompensated respiratory acidosis: pH 7.21, pCO2 98, pO2 63,  and bicarb 39, on 5L. Patient was transferred to the intensive care unit, where he was placed on BiPAP.   He also is noted to have a rash, but states that he has had it for "30 years."     He has a  history of multivessel CAD, status post acute inferior myocardial infarction with associated cardiogenic shock in February 2006, initially treated with emergent drug-eluting stenting of a 100% occluded mid RCA stenosis, followed by 5-vessel CABG, by Dr. Charlett Lango. Initial ejection fraction 39% by cardiac catheterization, but subsequent normalization, as assessed by most recent  echocardiogram (EF 55% to 60%), in 2011.    SIGNIFICANT EVENTS / STUDIES:  6/13 Admitted in transfer from [facility] to Cards service  6/13 Echo: LVEF 55-60%, mild LA enlargement, mod RA enlargement, mild to mod RV dilatation, estimated RVSP 62 mmHg  6/16 RHC: RV: 87/9 mm Hg, PA 84/81 mm Hg with mean 57 mm Hg, PCWP 25 mm Hg. Fick Cardiac Output: 5.99 L/minute ,2.43 L/min/M2, severe pulmonary hypertension  6/19 BLE Doppler: Negative for DVTs bilaterally   COURSE:   A: Acute on chronic respiratory failure  Chronic Respiratory Acidosis.  Severe Pulmonary Hypertension by RHC-6/17  Hypoxia - felt to be a Combination of atelectasis & ? Obstructive lung disease with severe PH causing VQ mismatch  Suspect decompensated OHS  History compatible with OSA - not formally diagnosed  Smoker  P:  He was discharged on bipap nocturnal with 4L O2  CARDIOVASCULAR  A:   CAD  H/O CHF  Hyperlipidemia  Cor pulmonale  There was  Question of old vegetation on aortic valve Severe pulm hypertension - combination of hypoxia/OSA/ lung disease/ diastolic dysfunction/ L sided disease (PAOP 25)   P:  - torsemide as ordered  -He was taken off ACE-I, coreg,  - not a good candidate for TEE given overall resp status,  deferred  Met alkalosis due to loop diuresis (was on diamox)  He was treated with IV antibiotics for treatment of possible cellulitis.   He diuresed well, compliant with bipap & O2 since dc , has come off O2 daytime (by himself) & has quit smoking He however desatn to 85% on walking after first lap  Past Medical History  Diagnosis Date  . Coronary artery disease   . Hypertension   . Pneumonia   . Pulmonary hypertension   . Diabetes mellitus     type 2    Past Surgical History  Procedure Laterality Date  . Coronary artery bypass graft  09-20-04    x5 LIMA to LAD, left radial to D1, SVG to ramus and SVG to PDA/PLA     Review of Systems  neg for any significant sore throat, dysphagia, itching, sneezing, nasal congestion or excess/ purulent secretions, fever, chills, sweats, unintended wt loss, pleuritic or exertional cp, hempoptysis, orthopnea pnd or change in chronic leg swelling. Also denies presyncope, palpitations, heartburn, abdominal pain, nausea, vomiting, diarrhea or change in bowel or urinary habits, dysuria,hematuria, rash, arthralgias, visual complaints, headache, numbness weakness or ataxia.  Objective:   Physical Exam  Gen. Pleasant, obese, in no distress, normal affect ENT - no lesions, no post nasal drip, class 2-3 airway Neck: No JVD, no thyromegaly, no carotid bruits Lungs: no use of accessory muscles, no dullness to percussion, decreased without rales or rhonchi  Cardiovascular: Rhythm regular, heart sounds  normal, no murmurs or gallops, 1+ peripheral edema (better) Abdomen: soft and non-tender, no  hepatosplenomegaly, BS normal. Musculoskeletal: No deformities, no cyanosis or clubbing Neuro:  alert, non focal, no tremors       Assessment & Plan:

## 2013-02-11 ENCOUNTER — Encounter: Payer: Self-pay | Admitting: *Deleted

## 2013-02-19 ENCOUNTER — Telehealth: Payer: Self-pay | Admitting: Pulmonary Disease

## 2013-02-19 NOTE — Telephone Encounter (Signed)
Pt is aware of results. He states that we can speak to his mother about him in the future.

## 2013-02-19 NOTE — Telephone Encounter (Signed)
Pt is returning triage's call & can be reached at [phone].  Cesar Cantrell

## 2013-02-19 NOTE — Telephone Encounter (Signed)
I spoke with mother. She gave me cell # to call for results. [phone]  LMTCB x1  On cell for pt

## 2013-02-19 NOTE — Telephone Encounter (Signed)
Notes Recorded by Oretha Milch, MD on 02/10/2013 at 1:31 PM Potassium, kidney function ok   ATC line busy x 3 wcb

## 2013-02-20 ENCOUNTER — Ambulatory Visit (HOSPITAL_BASED_OUTPATIENT_CLINIC_OR_DEPARTMENT_OTHER): Payer: Medicaid - Out of State | Attending: Emergency Medicine | Admitting: Radiology

## 2013-02-20 VITALS — Ht 69.0 in | Wt 275.0 lb

## 2013-02-20 DIAGNOSIS — G4733 Obstructive sleep apnea (adult) (pediatric): Secondary | ICD-10-CM | POA: Insufficient documentation

## 2013-02-20 DIAGNOSIS — G4730 Sleep apnea, unspecified: Secondary | ICD-10-CM

## 2013-02-20 DIAGNOSIS — I2510 Atherosclerotic heart disease of native coronary artery without angina pectoris: Secondary | ICD-10-CM | POA: Insufficient documentation

## 2013-02-20 DIAGNOSIS — E119 Type 2 diabetes mellitus without complications: Secondary | ICD-10-CM | POA: Insufficient documentation

## 2013-02-20 DIAGNOSIS — I10 Essential (primary) hypertension: Secondary | ICD-10-CM | POA: Insufficient documentation

## 2013-02-20 DIAGNOSIS — I2789 Other specified pulmonary heart diseases: Secondary | ICD-10-CM | POA: Insufficient documentation

## 2013-02-26 ENCOUNTER — Other Ambulatory Visit: Payer: Self-pay | Admitting: Emergency Medicine

## 2013-02-26 DIAGNOSIS — G4733 Obstructive sleep apnea (adult) (pediatric): Secondary | ICD-10-CM

## 2013-02-26 DIAGNOSIS — G4736 Sleep related hypoventilation in conditions classified elsewhere: Secondary | ICD-10-CM

## 2013-02-26 NOTE — Procedures (Signed)
Cesar Cantrell, Cesar Cantrell NO.:  [id number]  MEDICAL RECORD NO.:  [id number]          PATIENT TYPE:  OUT  LOCATION:  SLEEP CENTER                 FACILITY:  [facility]  PHYSICIAN:  Coralyn Helling, MD        DATE OF BIRTH:  1966/03/14  DATE OF STUDY:                           NOCTURNAL POLYSOMNOGRAM  REFERRING PHYSICIAN:  Leslye Peer, MD  INDICATION:  [name] is a 47 year old male, who has a history of diabetes, hypertension, and coronary artery disease.  He also has pulmonary hypertension.  He has snoring sleep disruption and daytime sleepiness.  There is clinical concern for sleep-disordered breathing with obstructive sleep apnea as well as sleep related hyperventilation. He is referred to the sleep lab for further evaluation of these issues.  Height is 5 feet 9 inches, weight is 225 pounds, BMI is 41, neck size 18 inches.  MEDICATIONS:  Reviewed in the chart.  Apgar scores 21.  SLEEP ARCHITECTURE:  The patient followed a split night study protocol. During the diagnostic portion of study, total recording time was 216 minutes.  Total sleep time was 122 minutes.  Sleep efficiency was 56%. Sleep latency was 47 minutes.  This portion of study was notable for lack of slow-wave sleep and REM sleep.  He slept predominantly in the nonsupine position.  During the titration portion of study, total recording time was 225 minutes.  Total sleep time was 157 minutes.  Sleep efficiency was 70%. Sleep latency was 7 minutes.  REM latency was 5.5 minute.  This portion of the study was notable for the lack of slow-wave sleep and increase in the percentage of REM sleep.  He slept in both the supine and nonsupine positions.  RESPIRATORY DATA:  The average respiratory rate was 18.  Loud snoring was noted by the technician.  During the diagnostic portion of study, the overall apnea/hypopnea index was 63.4.  There were 4 central apneic events.  The remainder of the events were  obstructive in nature.  During the titration portion of study, the patient was noted to have frequent central apneic events and as result was transitioned to BiPAP therapy.  He was transitioned from BiPAP of 8/4 to 15/9 cm water pressure.  With BiPAP at 13/9 cm of water pressure, his apnea-hypopnea index was reduced to 0.7.  At this pressure setting, he was observed in REM sleep.  OXYGEN DATA:  The baseline oxygenation was 94%.  The oxygen saturation nadir was 78%.  The patient continued to have oxygen desaturations in the absence of apneic events while using BiPAP therapy.  He was therefore started on supplemental oxygen at 1 L and increased to 2 L. With a combination of BiPAP of 13/9 cm of water and 2 L of supplemental oxygen, he had good control of his oxygenation.  CARDIAC DATA:  The average heart rate was 76.  The rhythm strip showed frequent PACs and PVCs with no parasomnia.  The periodic limb movement index was 0.  The patient had 2 restroom trips.  IMPRESSION:  This study shows evidence for severe obstructive sleep apnea with an apnea/hypopnea index of 63.4.  An oxygen saturation nadir of 70%.  During the titration portion of the study, he had frequent central apneic events.  He was therefore transitioned to BiPAP therapy.  He did well with BiPAP of 13/9 cm water.  He required use of 2 L of oxygen in addition to BiPAP therapy to control his oxygenation.  This would be consistent with sleep- related hypoventilation in addition to obstructive sleep apnea.  In addition to diet, exercise, and weight reduction, I would recommend the patient be started on BiPAP of 13/9 cm of water with 2 L of supplemental oxygen.     Coralyn Helling, MD Diplomat, American Board of Sleep Medicine    VS/MEDQ  D:  02/26/2013 10:04:01  T:  02/26/2013 11:26:03  Job:  [id number]

## 2013-02-27 ENCOUNTER — Encounter (HOSPITAL_COMMUNITY): Payer: Self-pay | Admitting: Family Medicine

## 2013-02-27 ENCOUNTER — Emergency Department (HOSPITAL_COMMUNITY): Payer: Medicaid - Out of State

## 2013-02-27 ENCOUNTER — Inpatient Hospital Stay (HOSPITAL_COMMUNITY)
Admission: EM | Admit: 2013-02-27 | Discharge: 2013-03-03 | DRG: 603 | Disposition: A | Discharge status: Home / Self Care | Payer: Medicaid - Out of State | Attending: Family Medicine | Admitting: Family Medicine

## 2013-02-27 DIAGNOSIS — E119 Type 2 diabetes mellitus without complications: Secondary | ICD-10-CM | POA: Diagnosis present

## 2013-02-27 DIAGNOSIS — Z7982 Long term (current) use of aspirin: Secondary | ICD-10-CM

## 2013-02-27 DIAGNOSIS — Z720 Tobacco use: Secondary | ICD-10-CM

## 2013-02-27 DIAGNOSIS — I2510 Atherosclerotic heart disease of native coronary artery without angina pectoris: Secondary | ICD-10-CM | POA: Diagnosis present

## 2013-02-27 DIAGNOSIS — L03119 Cellulitis of unspecified part of limb: Principal | ICD-10-CM

## 2013-02-27 DIAGNOSIS — E662 Morbid (severe) obesity with alveolar hypoventilation: Secondary | ICD-10-CM | POA: Diagnosis present

## 2013-02-27 DIAGNOSIS — Z87891 Personal history of nicotine dependence: Secondary | ICD-10-CM

## 2013-02-27 DIAGNOSIS — E873 Alkalosis: Secondary | ICD-10-CM

## 2013-02-27 DIAGNOSIS — I2789 Other specified pulmonary heart diseases: Secondary | ICD-10-CM | POA: Diagnosis present

## 2013-02-27 DIAGNOSIS — J9610 Chronic respiratory failure, unspecified whether with hypoxia or hypercapnia: Secondary | ICD-10-CM | POA: Diagnosis present

## 2013-02-27 DIAGNOSIS — I2781 Cor pulmonale (chronic): Secondary | ICD-10-CM | POA: Diagnosis present

## 2013-02-27 DIAGNOSIS — D72829 Elevated white blood cell count, unspecified: Secondary | ICD-10-CM

## 2013-02-27 DIAGNOSIS — I279 Pulmonary heart disease, unspecified: Secondary | ICD-10-CM

## 2013-02-27 DIAGNOSIS — E872 Acidosis, unspecified: Secondary | ICD-10-CM | POA: Diagnosis present

## 2013-02-27 DIAGNOSIS — J9600 Acute respiratory failure, unspecified whether with hypoxia or hypercapnia: Secondary | ICD-10-CM

## 2013-02-27 DIAGNOSIS — B353 Tinea pedis: Secondary | ICD-10-CM | POA: Diagnosis present

## 2013-02-27 DIAGNOSIS — E782 Mixed hyperlipidemia: Secondary | ICD-10-CM | POA: Diagnosis present

## 2013-02-27 DIAGNOSIS — Z9981 Dependence on supplemental oxygen: Secondary | ICD-10-CM

## 2013-02-27 DIAGNOSIS — Z951 Presence of aortocoronary bypass graft: Secondary | ICD-10-CM

## 2013-02-27 DIAGNOSIS — M79609 Pain in unspecified limb: Secondary | ICD-10-CM

## 2013-02-27 DIAGNOSIS — I10 Essential (primary) hypertension: Secondary | ICD-10-CM | POA: Diagnosis present

## 2013-02-27 DIAGNOSIS — IMO0001 Reserved for inherently not codable concepts without codable children: Secondary | ICD-10-CM | POA: Diagnosis present

## 2013-02-27 DIAGNOSIS — M7989 Other specified soft tissue disorders: Secondary | ICD-10-CM

## 2013-02-27 DIAGNOSIS — G4733 Obstructive sleep apnea (adult) (pediatric): Secondary | ICD-10-CM | POA: Diagnosis present

## 2013-02-27 DIAGNOSIS — IMO0002 Reserved for concepts with insufficient information to code with codable children: Secondary | ICD-10-CM

## 2013-02-27 DIAGNOSIS — L03115 Cellulitis of right lower limb: Secondary | ICD-10-CM

## 2013-02-27 DIAGNOSIS — E876 Hypokalemia: Secondary | ICD-10-CM | POA: Diagnosis present

## 2013-02-27 DIAGNOSIS — I25810 Atherosclerosis of coronary artery bypass graft(s) without angina pectoris: Secondary | ICD-10-CM

## 2013-02-27 DIAGNOSIS — E669 Obesity, unspecified: Secondary | ICD-10-CM | POA: Diagnosis present

## 2013-02-27 DIAGNOSIS — L0390 Cellulitis, unspecified: Secondary | ICD-10-CM

## 2013-02-27 DIAGNOSIS — E7800 Pure hypercholesterolemia, unspecified: Secondary | ICD-10-CM

## 2013-02-27 DIAGNOSIS — R0902 Hypoxemia: Secondary | ICD-10-CM

## 2013-02-27 DIAGNOSIS — L02419 Cutaneous abscess of limb, unspecified: Principal | ICD-10-CM | POA: Diagnosis present

## 2013-02-27 HISTORY — DX: Sleep apnea, unspecified: G47.30

## 2013-02-27 LAB — COMPREHENSIVE METABOLIC PANEL
ALT: 28 U/L (ref 0–53)
Albumin: 3.4 g/dL — ABNORMAL LOW (ref 3.5–5.2)
Alkaline Phosphatase: 78 U/L (ref 39–117)
BUN: 14 mg/dL (ref 6–23)
Calcium: 9.4 mg/dL (ref 8.4–10.5)
GFR calc Af Amer: >90 mL/min (ref >90)
Glucose, Bld: 198 mg/dL — ABNORMAL HIGH (ref 70–99)
Potassium: 3.4 mEq/L — ABNORMAL LOW (ref 3.5–5.1)
Sodium: 130 mEq/L — ABNORMAL LOW (ref 135–145)
Total Protein: 7.2 g/dL (ref 6.0–8.3)

## 2013-02-27 LAB — CBC WITH DIFFERENTIAL/PLATELET
Basophils Absolute: 0 10*3/uL (ref 0.0–0.1)
Eosinophils Absolute: 0 10*3/uL (ref 0.0–0.7)
Lymphocytes Relative: 4 % — ABNORMAL LOW (ref 12–46)
MCHC: 36.1 g/dL — ABNORMAL HIGH (ref 30.0–36.0)
Monocytes Relative: 2 % — ABNORMAL LOW (ref 3–12)
Platelets: 163 10*3/uL (ref 150–400)
RDW: 14.4 % (ref 11.5–15.5)
WBC: 20.1 10*3/uL — ABNORMAL HIGH (ref 4.0–10.5)

## 2013-02-27 LAB — POCT I-STAT TROPONIN I: Troponin i, poc: 0 ng/mL (ref 0.00–0.08)

## 2013-02-27 LAB — POCT I-STAT, CHEM 8
BUN: 14 mg/dL (ref 6–23)
Chloride: 89 mEq/L — ABNORMAL LOW (ref 96–112)
Creatinine, Ser: 0.9 mg/dL (ref 0.50–1.35)
Sodium: 131 mEq/L — ABNORMAL LOW (ref 135–145)

## 2013-02-27 LAB — PRO B NATRIURETIC PEPTIDE: Pro B Natriuretic peptide (BNP): 974.9 pg/mL — ABNORMAL HIGH (ref 0–125)

## 2013-02-27 LAB — GLUCOSE, CAPILLARY: Glucose-Capillary: 168 mg/dL — ABNORMAL HIGH (ref 70–99)

## 2013-02-27 LAB — CG4 I-STAT (LACTIC ACID): Lactic Acid, Venous: 3.58 mmol/L — ABNORMAL HIGH (ref 0.5–2.2)

## 2013-02-27 MED ORDER — ACETAMINOPHEN 325 MG PO TABS
650.0000 mg | ORAL_TABLET | Freq: Four times a day (QID) | ORAL | Status: DC | PRN
  Administered 2013-02-28: 650 mg via ORAL
  Filled 2013-02-27: qty 2

## 2013-02-27 MED ORDER — SODIUM CHLORIDE 0.9 % IV SOLN: 250.0000 mL | INTRAVENOUS | Status: DC | PRN

## 2013-02-27 MED ORDER — GLIMEPIRIDE 4 MG PO TABS
4.0000 mg | ORAL_TABLET | Freq: Two times a day (BID) | ORAL | Status: DC
  Administered 2013-02-28 – 2013-03-03 (×7): 4 mg via ORAL
  Filled 2013-02-27 (×9): qty 1

## 2013-02-27 MED ORDER — SODIUM CHLORIDE 0.9 % IJ SOLN
3.0000 mL | Freq: Two times a day (BID) | INTRAMUSCULAR | Status: DC
  Administered 2013-02-27 – 2013-03-03 (×3): 3 mL via INTRAVENOUS

## 2013-02-27 MED ORDER — INSULIN ASPART 100 UNIT/ML ~~LOC~~ SOLN
0.0000 [IU] | Freq: Three times a day (TID) | SUBCUTANEOUS | Status: DC
  Administered 2013-02-28: 5 [IU] via SUBCUTANEOUS
  Administered 2013-02-28 (×2): 3 [IU] via SUBCUTANEOUS
  Administered 2013-03-01: 5 [IU] via SUBCUTANEOUS
  Administered 2013-03-01: 3 [IU] via SUBCUTANEOUS
  Administered 2013-03-01: 2 [IU] via SUBCUTANEOUS
  Administered 2013-03-02: 8 [IU] via SUBCUTANEOUS
  Administered 2013-03-02 (×2): 2 [IU] via SUBCUTANEOUS
  Administered 2013-03-03: 5 [IU] via SUBCUTANEOUS
  Administered 2013-03-03: 2 [IU] via SUBCUTANEOUS

## 2013-02-27 MED ORDER — ONDANSETRON HCL 4 MG PO TABS: 4.0000 mg | ORAL_TABLET | Freq: Four times a day (QID) | ORAL | Status: DC | PRN

## 2013-02-27 MED ORDER — ASPIRIN EC 81 MG PO TBEC
81.0000 mg | DELAYED_RELEASE_TABLET | Freq: Every morning | ORAL | Status: DC
  Administered 2013-02-28 – 2013-03-03 (×4): 81 mg via ORAL
  Filled 2013-02-27 (×4): qty 1

## 2013-02-27 MED ORDER — INSULIN DETEMIR 100 UNIT/ML ~~LOC~~ SOLN
5.0000 [IU] | Freq: Every day | SUBCUTANEOUS | Status: DC
  Administered 2013-02-27 – 2013-03-02 (×4): 5 [IU] via SUBCUTANEOUS
  Filled 2013-02-27 (×8): qty 0.05

## 2013-02-27 MED ORDER — TORSEMIDE 20 MG PO TABS
20.0000 mg | ORAL_TABLET | Freq: Two times a day (BID) | ORAL | Status: DC
  Administered 2013-02-28 – 2013-03-03 (×7): 20 mg via ORAL
  Filled 2013-02-27 (×9): qty 1

## 2013-02-27 MED ORDER — KETOCONAZOLE 2 % EX CREA
TOPICAL_CREAM | Freq: Two times a day (BID) | CUTANEOUS | Status: DC
  Administered 2013-02-27 – 2013-03-03 (×8): via TOPICAL
  Filled 2013-02-27 (×2): qty 15

## 2013-02-27 MED ORDER — ONDANSETRON HCL 4 MG/2ML IJ SOLN
4.0000 mg | Freq: Once | INTRAMUSCULAR | Status: AC
  Administered 2013-02-27: 4 mg via INTRAVENOUS
  Filled 2013-02-27: qty 2

## 2013-02-27 MED ORDER — POTASSIUM CHLORIDE CRYS ER 10 MEQ PO TBCR
10.0000 meq | EXTENDED_RELEASE_TABLET | Freq: Two times a day (BID) | ORAL | Status: DC
  Administered 2013-02-27 – 2013-03-03 (×8): 10 meq via ORAL
  Filled 2013-02-27 (×9): qty 1

## 2013-02-27 MED ORDER — VANCOMYCIN HCL 1000 MG IV SOLR: 15.0000 mg/kg | Freq: Once | INTRAVENOUS | Status: DC

## 2013-02-27 MED ORDER — OXYCODONE HCL 5 MG PO TABS
5.0000 mg | ORAL_TABLET | ORAL | Status: DC | PRN
  Administered 2013-02-28 – 2013-03-02 (×4): 10 mg via ORAL
  Filled 2013-02-27 (×5): qty 2

## 2013-02-27 MED ORDER — VANCOMYCIN HCL 10 G IV SOLR
2500.0000 mg | INTRAVENOUS | Status: AC
  Administered 2013-02-27: 2500 mg via INTRAVENOUS
  Filled 2013-02-27: qty 2500

## 2013-02-27 MED ORDER — MORPHINE SULFATE 4 MG/ML IJ SOLN
6.0000 mg | Freq: Once | INTRAMUSCULAR | Status: AC
  Administered 2013-02-27: 6 mg via INTRAVENOUS
  Filled 2013-02-27: qty 2
  Filled 2013-02-27: qty 1

## 2013-02-27 MED ORDER — ALBUTEROL SULFATE HFA 108 (90 BASE) MCG/ACT IN AERS
2.0000 | INHALATION_SPRAY | Freq: Four times a day (QID) | RESPIRATORY_TRACT | Status: DC | PRN
  Filled 2013-02-27: qty 6.7

## 2013-02-27 MED ORDER — PIPERACILLIN-TAZOBACTAM 3.375 G IVPB
3.3750 g | Freq: Three times a day (TID) | INTRAVENOUS | Status: DC
  Administered 2013-02-27 – 2013-03-02 (×8): 3.375 g via INTRAVENOUS
  Filled 2013-02-27 (×10): qty 50

## 2013-02-27 MED ORDER — SODIUM CHLORIDE 0.9 % IJ SOLN: 3.0000 mL | INTRAMUSCULAR | Status: DC | PRN

## 2013-02-27 MED ORDER — VANCOMYCIN HCL 10 G IV SOLR
1250.0000 mg | Freq: Two times a day (BID) | INTRAVENOUS | Status: DC
  Administered 2013-02-28 – 2013-03-02 (×5): 1250 mg via INTRAVENOUS
  Filled 2013-02-27 (×6): qty 1250

## 2013-02-27 MED ORDER — ATORVASTATIN CALCIUM 40 MG PO TABS
40.0000 mg | ORAL_TABLET | Freq: Every day | ORAL | Status: DC
  Administered 2013-02-28 – 2013-03-02 (×3): 40 mg via ORAL
  Filled 2013-02-27 (×4): qty 1

## 2013-02-27 MED ORDER — HEPARIN SODIUM (PORCINE) 5000 UNIT/ML IJ SOLN
5000.0000 [IU] | Freq: Three times a day (TID) | INTRAMUSCULAR | Status: DC
  Administered 2013-02-27 – 2013-03-03 (×11): 5000 [IU] via SUBCUTANEOUS
  Filled 2013-02-27 (×14): qty 1

## 2013-02-27 MED ORDER — ONDANSETRON HCL 4 MG/2ML IJ SOLN
4.0000 mg | Freq: Four times a day (QID) | INTRAMUSCULAR | Status: DC | PRN
  Administered 2013-03-01: 4 mg via INTRAVENOUS
  Filled 2013-02-27: qty 2

## 2013-02-27 MED ORDER — ACETAMINOPHEN 325 MG PO TABS
650.0000 mg | ORAL_TABLET | Freq: Once | ORAL | Status: AC
  Administered 2013-02-27: 650 mg via ORAL
  Filled 2013-02-27 (×2): qty 1

## 2013-02-27 MED ORDER — ACETAMINOPHEN 650 MG RE SUPP: 650.0000 mg | Freq: Four times a day (QID) | RECTAL | Status: DC | PRN

## 2013-02-27 NOTE — ED Notes (Signed)
Swelling/+3 edema in legs bilaterally with redness in right leg at the right knee and below to the right foot.  Pulses are intact.  Both legs very warm with increased warmth in right leg.

## 2013-02-27 NOTE — Progress Notes (Signed)
ANTIBIOTIC CONSULT NOTE - INITIAL  Pharmacy Consult for Vancomycin Indication: right leg cellulitis  No Known Allergies  Patient Measurements: Height: 5\' 9"  (175.3 cm) Weight: 286 lb 13.1 oz (130.1 kg) IBW/kg (Calculated) : 70.7  Vital Signs: Temp: 98.4 F (36.9 C) (07/24 2047) Temp src: Oral (07/24 2047) BP: 120/67 mmHg (07/24 2047) Pulse Rate: 97 (07/24 2047) Intake/Output from previous day:   Intake/Output from this shift: Total I/O In: 240 [P.O.:240] Out: -   Labs:  Recent Labs  02/27/13 1554 02/27/13 1601  WBC  --  20.1*  HGB 13.9 14.2  PLT  --  163  CREATININE 0.90 0.83   Estimated Creatinine Clearance: 151.8 ml/min (by C-G formula based on Cr of 0.83). No results found for this basename: VANCOTROUGH, VANCOPEAK, VANCORANDOM, GENTTROUGH, GENTPEAK, GENTRANDOM, TOBRATROUGH, TOBRAPEAK, TOBRARND, AMIKACINPEAK, AMIKACINTROU, AMIKACIN,  in the last 72 hours   Microbiology: No results found for this or any previous visit (from the past 720 hour(s)).  Medical History: Past Medical History  Diagnosis Date  . Coronary artery disease   . Hypertension   . Pneumonia   . Pulmonary hypertension   . Diabetes mellitus     type 2  . Sleep apnea     Medications:  Prescriptions prior to admission  Medication Sig Dispense Refill  . aspirin EC 81 MG tablet Take 81 mg by mouth every morning.      Marland Kitchen glimepiride (AMARYL) 4 MG tablet Take 4 mg by mouth 2 (two) times daily with a meal.      . metFORMIN (GLUCOPHAGE) 1000 MG tablet Take 1,000 mg by mouth 2 (two) times daily with a meal.       . simvastatin (ZOCOR) 80 MG tablet Take 40 mg by mouth at bedtime.       . torsemide (DEMADEX) 20 MG tablet Take 1 tablet (20 mg total) by mouth 2 (two) times daily.      Marland Kitchen albuterol (PROVENTIL HFA;VENTOLIN HFA) 108 (90 BASE) MCG/ACT inhaler Inhale 2 puffs into the lungs every 6 (six) hours as needed for wheezing.  1 Inhaler  2   Assessment: 47 y/o male patient admitted with leg pain,  leukocytosis, and low grade fever requiring gram positive coverage for cellulitis. Received vancomycin 2.5g in ED, to continue on zosyn and vanc. Will adjust for obesity.  Goal of Therapy:  Vancomycin trough level 10-15 mcg/ml  Plan:  Vancomycin 1250mg  IV q12 and monitor renal function. Measure antibiotic drug levels at steady state Follow up culture results  Verlene Mayer, PharmD, BCPS Pager [phone] 02/27/2013,9:41 PM

## 2013-02-27 NOTE — Progress Notes (Signed)
CPAP machine is set up ready to go when PT is ready to use it. Pt says he is not ready to go to bed right now but he will get his nurse to call RT when he is ready for his machine. RT will continue to assist as needed.

## 2013-02-27 NOTE — Progress Notes (Signed)
VASCULAR LAB PRELIMINARY  PRELIMINARY  PRELIMINARY  PRELIMINARY  Right lower extremity venous Dopplers completed.    Preliminary report:  There is no DVT or SVT noted in the right lower extremity.  Angelgabriel Willmore, RVT 02/27/2013, 6:09 PM

## 2013-02-27 NOTE — ED Provider Notes (Signed)
CSN: [id number]     Arrival date & time 02/27/13  1427 History     First MD Initiated Contact with Patient 02/27/13 1507     Chief Complaint  Patient presents with  . Leg Swelling  . Hyperglycemia  . Emesis   (Consider location/radiation/quality/duration/timing/severity/associated sxs/prior Treatment) HPI Comments: Pt w/ extensive PMHx significant for CAD s/p CABG in 2006, HTN, pulm HTN, DM, recent d/c from hospital for severe pneumonia and recurrent leg cellulitis now w/ acute onset RLE swelling and emesis. States healing well, no dyspnea, cough, or fever. BLE swelling and erythema had improved until 2 days ago - noted acute onset RLE redness, warmth, swelling and pain. No trauma. No hx of DVT/PE, sx a/w nausea and vomiting x 12 - NBNB. No abd pain. On arrival to ED pt is afebrile, hypoxic - sats 86% on RA - 97 on 4L - his home dose. Tachycardic at 100 and RR 18, c/o mild HA but denies other sx  Patient is a 47 y.o. male presenting with vomiting and general illness. The history is provided by the patient. No language interpreter was used.  Emesis Associated symptoms: no abdominal pain, no chills, no diarrhea, no headaches and no sore throat   Illness Location:  RLE, GI Quality:  Leg swelling, erythema, warmth, N/V Severity:  Moderate Onset quality:  Sudden Duration:  2 days Timing:  Constant Progression:  Worsening Chronicity:  Recurrent Associated symptoms: nausea and vomiting   Associated symptoms: no abdominal pain, no chest pain, no congestion, no cough, no diarrhea, no fever, no headaches, no rash, no shortness of breath and no sore throat     Past Medical History  Diagnosis Date  . Coronary artery disease   . Hypertension   . Pneumonia   . Pulmonary hypertension   . Diabetes mellitus     type 2   Past Surgical History  Procedure Laterality Date  . Coronary artery bypass graft  09-20-04    x5 LIMA to LAD, left radial to D1, SVG to ramus and SVG to PDA/PLA   Family  History  Problem Relation Age of Onset  . Heart disease Other 60    grandmother  . Coronary artery disease      No hx of premature heart disease   History  Substance Use Topics  . Smoking status: Former Smoker -- 1.50 packs/day for 30 years    Types: Cigarettes    Quit date: 01/20/2013  . Smokeless tobacco: Not on file  . Alcohol Use: No    Review of Systems  Constitutional: Negative for fever and chills.  HENT: Negative for congestion and sore throat.   Respiratory: Negative for cough and shortness of breath.   Cardiovascular: Positive for leg swelling. Negative for chest pain.  Gastrointestinal: Positive for nausea and vomiting. Negative for abdominal pain, diarrhea and constipation.  Genitourinary: Negative for dysuria and frequency.  Skin: Positive for color change and wound. Negative for rash.  Neurological: Negative for dizziness and headaches.  Psychiatric/Behavioral: Negative for confusion and agitation.  All other systems reviewed and are negative.    Allergies  Review of patient's allergies indicates no known allergies.  Home Medications   Current Outpatient Rx  Name  Route  Sig  Dispense  Refill  . aspirin EC 81 MG tablet   Oral   Take 81 mg by mouth every morning.         Marland Kitchen glimepiride (AMARYL) 4 MG tablet   Oral   Take 4 mg  by mouth 2 (two) times daily with a meal.         . metFORMIN (GLUCOPHAGE) 1000 MG tablet   Oral   Take 1,000 mg by mouth 2 (two) times daily with a meal.          . simvastatin (ZOCOR) 80 MG tablet   Oral   Take 40 mg by mouth at bedtime.          . torsemide (DEMADEX) 20 MG tablet   Oral   Take 1 tablet (20 mg total) by mouth 2 (two) times daily.         Marland Kitchen albuterol (PROVENTIL HFA;VENTOLIN HFA) 108 (90 BASE) MCG/ACT inhaler   Inhalation   Inhale 2 puffs into the lungs every 6 (six) hours as needed for wheezing.   1 Inhaler   2    BP 105/61  Pulse 96  Temp(Src) 98.8 F (37.1 C)  Resp 16  SpO2  97% Physical Exam  Constitutional: He is oriented to person, place, and time. He appears well-developed and well-nourished. No distress.  HENT:  Head: Normocephalic and atraumatic.  Eyes: EOM are normal. Pupils are equal, round, and reactive to light.  Neck: Normal range of motion. Neck supple.  Cardiovascular: Regular rhythm.  Tachycardia present.   Pulmonary/Chest: Effort normal and breath sounds normal. No respiratory distress. He has no decreased breath sounds.  Abdominal: Soft. He exhibits no distension. There is no tenderness.  Musculoskeletal: Normal range of motion. He exhibits no edema.       Legs: Neurological: He is alert and oriented to person, place, and time.  Skin: Skin is warm and dry.  Psychiatric: He has a normal mood and affect. His behavior is normal.    ED Course   Procedures (including critical care time)  Results for orders placed during the hospital encounter of 02/27/13  GLUCOSE, CAPILLARY      Result Value Range   Glucose-Capillary 205 (*) 70 - 99 mg/dL   Comment 1 Notify RN     Comment 2 Documented in Chart    CBC WITH DIFFERENTIAL      Result Value Range   WBC 20.1 (*) 4.0 - 10.5 K/uL   RBC 5.28  4.22 - 5.81 MIL/uL   Hemoglobin 14.2  13.0 - 17.0 g/dL   HCT 28.4  13.2 - 44.0 %   MCV 74.4 (*) 78.0 - 100.0 fL   MCH 26.9  26.0 - 34.0 pg   MCHC 36.1 (*) 30.0 - 36.0 g/dL   RDW 10.2  72.5 - 36.6 %   Platelets 163  150 - 400 K/uL   Neutrophils Relative % 94 (*) 43 - 77 %   Lymphocytes Relative 4 (*) 12 - 46 %   Monocytes Relative 2 (*) 3 - 12 %   Eosinophils Relative 0  0 - 5 %   Basophils Relative 0  0 - 1 %   Neutro Abs 18.9 (*) 1.7 - 7.7 K/uL   Lymphs Abs 0.8  0.7 - 4.0 K/uL   Monocytes Absolute 0.4  0.1 - 1.0 K/uL   Eosinophils Absolute 0.0  0.0 - 0.7 K/uL   Basophils Absolute 0.0  0.0 - 0.1 K/uL   RBC Morphology POLYCHROMASIA PRESENT     WBC Morphology TOXIC GRANULATION    COMPREHENSIVE METABOLIC PANEL      Result Value Range   Sodium 130  (*) 135 - 145 mEq/L   Potassium 3.4 (*) 3.5 - 5.1 mEq/L   Chloride 87 (*)  96 - 112 mEq/L   CO2 29  19 - 32 mEq/L   Glucose, Bld 198 (*) 70 - 99 mg/dL   BUN 14  6 - 23 mg/dL   Creatinine, Ser 1.61  0.50 - 1.35 mg/dL   Calcium 9.4  8.4 - 09.6 mg/dL   Total Protein 7.2  6.0 - 8.3 g/dL   Albumin 3.4 (*) 3.5 - 5.2 g/dL   AST 17  0 - 37 U/L   ALT 28  0 - 53 U/L   Alkaline Phosphatase 78  39 - 117 U/L   Total Bilirubin 0.5  0.3 - 1.2 mg/dL   GFR calc non Af Amer >90  >90 mL/min   GFR calc Af Amer >90  >90 mL/min  PRO B NATRIURETIC PEPTIDE      Result Value Range   Pro B Natriuretic peptide (BNP) 974.9 (*) 0 - 125 pg/mL  POCT I-STAT, CHEM 8      Result Value Range   Sodium 131 (*) 135 - 145 mEq/L   Potassium 3.5  3.5 - 5.1 mEq/L   Chloride 89 (*) 96 - 112 mEq/L   BUN 14  6 - 23 mg/dL   Creatinine, Ser 0.45  0.50 - 1.35 mg/dL   Glucose, Bld 409 (*) 70 - 99 mg/dL   Calcium, Ion 8.11 (*) 1.12 - 1.23 mmol/L   TCO2 28  0 - 100 mmol/L   Hemoglobin 13.9  13.0 - 17.0 g/dL   HCT 91.4  78.2 - 95.6 %  CG4 I-STAT (LACTIC ACID)      Result Value Range   Lactic Acid, Venous 3.58 (*) 0.5 - 2.2 mmol/L  POCT I-STAT TROPONIN I      Result Value Range   Troponin i, poc 0.00  0.00 - 0.08 ng/mL   Comment 3              DG Chest 2 View (Final result)  Result time: 02/27/13 16:53:11    Final result by Rad Results In Interface (02/27/13 16:53:11)    Narrative:   *RADIOLOGY REPORT*  Clinical Data: Leg swelling, diabetes, hypertension, history of CABG  CHEST - 2 VIEW  Comparison: 01/26/2013; 01/22/2013; 09/22/2004; 09/13/2004  Findings: Grossly unchanged enlarged cardiac silhouette and mediastinal contours post median sternotomy and CABG. There are chronic findings of pulmonary venous congestion without definite evidence of superimposed pulmonary edema. Minimal improved aeration of the bilateral lung bases with persistent bibasilar opacities, right greater than left. No definite pleural  effusion or pneumothorax. Grossly unchanged bones.  IMPRESSION:  1. Findings of chronic pulmonary venous congestion without definite superimposed pulmonary edema. 2. Improved aeration of the lungs with persistent bibasilar opacities, right greater than left, likely atelectasis.   Original Report Authenticated By: Tacey Ruiz, MD         Date: 02/27/2013  Rate: 95  Rhythm: normal sinus rhythm  QRS Axis: normal  Intervals: normal  ST/T Wave abnormalities: TWI III and aVF - noted on previous ECG  Conduction Disutrbances:none  Narrative Interpretation:   Old EKG Reviewed: unchanged    MDM  Exam as above, NAD, hypoxic w/out [place] - 97% on 4L [place] - home dose, right leg exam c/w cellulitis. Possible DVT. Placed on vanc, blood cultures obtained. [place] negative for DVT. WBC 20k w/ left shift, lactic acid 3.58, BNP 974, labs otherwise unremarkable. CXR w/ improving infiltrates and effusions. No IVF given 2/2 pts CHF and normal hemodynamics. D/w internal medicine and pt admitted to hospital for cellulitis.  Admit in stable condition.   I have personally reviewed labs and imaging and considered in my MDM. Case d/w Dr Judd Lien   1. Cellulitis of leg, right   2. Lactic acidosis   3. Leukocytosis   4. Hypoxia      Audelia Hives, MD 02/27/13 [phone]

## 2013-02-27 NOTE — H&P (Signed)
[facility] History and Physical  Cesar Cantrell ZOX:[id number] DOB: [birth year] DOA: 02/27/2013  Referring physician: EDP PCP: Juliette Alcide, MD    Chief Complaint: leg infection, vomiting  HPI: Cesar Cantrell is a 47 y.o. male with DM, cor pulmonale presents with leg pain, redness, swelling, since yesterday. Vomited x8 today. Chills. Right leg cellulitis a month ago. Dopplers negative for dvt. Wbc 20 K. Lactate >3. On metformin.    Review of Systems: systems reviewed. As above. Otherwise negative  Past Medical History  Diagnosis Date  . Coronary artery disease   . Hypertension   . Pneumonia   . Pulmonary hypertension   . Diabetes mellitus     type 2   Past Surgical History  Procedure Laterality Date  . Coronary artery bypass graft  09-20-04    x5 LIMA to LAD, left radial to D1, SVG to ramus and SVG to PDA/PLA   Social History:  reports that he quit smoking about 5 weeks ago. His smoking use included Cigarettes. He has a 45 pack-year smoking history. He does not have any smokeless tobacco history on file. He reports that he does not drink alcohol or use illicit drugs. lives with mother  No Known Allergies  Family History  Problem Relation Age of Onset  . Heart disease Other 60    grandmother  . Coronary artery disease      No hx of premature heart disease    Prior to Admission medications   Medication Sig Start Date End Date Taking? Authorizing Provider  aspirin EC 81 MG tablet Take 81 mg by mouth every morning.   Yes Historical Provider, MD  glimepiride (AMARYL) 4 MG tablet Take 4 mg by mouth 2 (two) times daily with a meal.   Yes Historical Provider, MD  metFORMIN (GLUCOPHAGE) 1000 MG tablet Take 1,000 mg by mouth 2 (two) times daily with a meal.    Yes Historical Provider, MD  simvastatin (ZOCOR) 80 MG tablet Take 40 mg by mouth at bedtime.    Yes Historical Provider, MD  torsemide (DEMADEX) 20 MG tablet Take 1 tablet (20 mg total) by mouth 2 (two) times  daily. 01/29/13  Yes Leslye Peer, MD  albuterol (PROVENTIL HFA;VENTOLIN HFA) 108 (90 BASE) MCG/ACT inhaler Inhale 2 puffs into the lungs every 6 (six) hours as needed for wheezing. 01/29/13   Leslye Peer, MD   Physical Exam: Filed Vitals:   02/27/13 1521 02/27/13 1700 02/27/13 1837 02/27/13 1858  BP: 105/61 115/56 127/63   Pulse: 96 99  100  Temp:    100.2 F (37.9 C)  TempSrc:    Oral  Resp: 16 16 22 22   SpO2: 97% 97% 94% 93%   BP 120/67  Pulse 97  Temp(Src) 98.4 F (36.9 C) (Oral)  Resp 16  Ht 5\' 9"  (1.753 m)  Wt 130.1 kg (286 lb 13.1 oz)  BMI 42.34 kg/m2  SpO2 100%  General Appearance:    Alert, cooperative, no distress, appears stated age. Morbidly obese  Head:    Normocephalic, without obvious abnormality, atraumatic  Eyes:    PERRL, conjunctiva/corneas clear, EOM's intact, fundi    benign, both eyes       Ears:    Normal TM's and external ear canals, both ears  Nose:   Nares normal, septum midline, mucosa normal, no drainage   or sinus tenderness  Throat:   Lips, mucosa, and tongue normal; teeth and gums normal  Neck:   Supple, symmetrical, trachea midline,  no adenopathy;       thyroid:  No enlargement/tenderness/nodules; no carotid   bruit or JVD  Back:     Symmetric, no curvature, ROM normal, no CVA tenderness  Lungs:     Clear to auscultation bilaterally, respirations unlabored  Chest wall:    No tenderness or deformity  Heart:    Regular rate and rhythm, S1 and S2 normal, no murmur, rub   or gallop  Abdomen:     Soft, non-tender, bowel sounds active all four quadrants,    no masses, no organomegaly. obese  Genitalia:   deferred  Rectal:   deferred  Extremities:   Right leg with intense erythema, swelling, warmth. Tenderness. Distal to knee circumferential and dorsum of foot. Flaking of skin on feet an between toes.  Pulses:   2+ and symmetric all extremities  Skin:   See above. Multiple tatoos  Lymph nodes:   Cervical, supraclavicular, and axillary nodes  normal  Neurologic:   CNII-XII intact. Normal strength, sensation and reflexes      throughout    Psychiatric:  Normal affect  Labs on Admission:  Basic Metabolic Panel:  Recent Labs Lab 02/27/13 1554 02/27/13 1601  NA 131* 130*  K 3.5 3.4*  CL 89* 87*  CO2  --  29  GLUCOSE 217* 198*  BUN 14 14  CREATININE 0.90 0.83  CALCIUM  --  9.4   Liver Function Tests:  Recent Labs Lab 02/27/13 1601  AST 17  ALT 28  ALKPHOS 78  BILITOT 0.5  PROT 7.2  ALBUMIN 3.4*   No results found for this basename: LIPASE, AMYLASE,  in the last 168 hours No results found for this basename: AMMONIA,  in the last 168 hours CBC:  Recent Labs Lab 02/27/13 1554 02/27/13 1601  WBC  --  20.1*  NEUTROABS  --  18.9*  HGB 13.9 14.2  HCT 41.0 39.3  MCV  --  74.4*  PLT  --  163   Cardiac Enzymes: No results found for this basename: CKTOTAL, CKMB, CKMBINDEX, TROPONINI,  in the last 168 hours  BNP (last 3 results)  Recent Labs  01/22/13 0500 01/25/13 0455 02/27/13 1601  PROBNP 1054.0* 294.8* 974.9*   CBG:  Recent Labs Lab 02/27/13 1509  GLUCAP 205*    Radiological Exams on Admission: Dg Chest 2 View  02/27/2013   *RADIOLOGY REPORT*  Clinical Data: Leg swelling, diabetes, hypertension, history of CABG  CHEST - 2 VIEW  Comparison: 01/26/2013; 01/22/2013; 09/22/2004; 09/13/2004  Findings: Grossly unchanged enlarged cardiac silhouette and mediastinal contours post median sternotomy and CABG. There are chronic findings of pulmonary venous congestion without definite evidence of superimposed pulmonary edema.  Minimal improved aeration of the bilateral lung bases with persistent bibasilar opacities, right greater than left.  No definite pleural effusion or pneumothorax.  Grossly unchanged bones.  IMPRESSION:  1.  Findings of chronic pulmonary venous congestion without definite superimposed pulmonary edema. 2.  Improved aeration of the lungs with persistent bibasilar opacities, right greater  than left, likely atelectasis.   Original Report Authenticated By: Tacey Ruiz, MD    Assessment/Plan :   Cellulitis, right leg, recurrent, severe. Elevate. Admit to medsurg. vanc and zosyn. Active Problems:   DM, uncontrolled. Hold metformin. Cont amaryl. Add levemir and ssi. 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 Repeat in am   Hypokalemia replete   Chronic respiratory failure  dvt prophylaxis  Code Status: full Family Communication: mother Disposition Plan: home  Time spent: 60 minutes  Christiane Ha [facility] Pager [phone]  If 7PM-7AM, please contact night-coverage [email or web address] Password [facility] 02/27/2013, 7:16 PM

## 2013-02-27 NOTE — Telephone Encounter (Signed)
Pt currently admitted as of 02/27/13. Also, do not see where we have filled this for pt.

## 2013-02-27 NOTE — ED Provider Notes (Signed)
I saw and evaluated the patient, reviewed the resident's note and I agree with the findings and plan. The patient has an extensive past medical history including CABG, DM, HTN, Pulmonary HTN.  Presents with complaints of a painful, red, swollen leg for the past several days.  He was recently admitted for pneumonia and discharged one week ago.    On exam, the patient is afebrile and the vitals are stable.  The heart is regular rate and rhythm without murmurs.  The lungs are clear.  The abdomen is soft, non-tender and non-distended.  The right lower extremity is noted to have marked redness, swelling, and warmth from the mid-foot up to just below the knee.  The distal pulses are intact.  The workup reveals a wbc of 20k with an elevated lactate.  This is a severe cellulitis that will require iv antibiotics in repeat doses.  He was given vancomycin in the ED and will admitted to the medicine service.    Geoffery Lyons, MD 02/27/13 2256

## 2013-02-27 NOTE — Progress Notes (Signed)
Received report from Terrance, RN 

## 2013-02-27 NOTE — ED Notes (Signed)
Pt called for triage with no answer 

## 2013-02-27 NOTE — ED Notes (Addendum)
Pt here for RLE swelling and redness. sts has been very sick with vomiting and hyperglycemia. Pt recently in the ICU for the same. Pt uses home o2 when needed. Sent here by doctor for possible sepsis.

## 2013-02-28 DIAGNOSIS — L0291 Cutaneous abscess, unspecified: Secondary | ICD-10-CM

## 2013-02-28 DIAGNOSIS — L0390 Cellulitis, unspecified: Secondary | ICD-10-CM

## 2013-02-28 LAB — CBC
Hemoglobin: 14.1 g/dL (ref 13.0–17.0)
Platelets: 143 10*3/uL — ABNORMAL LOW (ref 150–400)
RBC: 5.22 MIL/uL (ref 4.22–5.81)
WBC: 20.8 10*3/uL — ABNORMAL HIGH (ref 4.0–10.5)

## 2013-02-28 LAB — GLUCOSE, CAPILLARY
Glucose-Capillary: 155 mg/dL — ABNORMAL HIGH (ref 70–99)
Glucose-Capillary: 232 mg/dL — ABNORMAL HIGH (ref 70–99)

## 2013-02-28 LAB — BASIC METABOLIC PANEL
CO2: 35 mEq/L — ABNORMAL HIGH (ref 19–32)
Chloride: 88 mEq/L — ABNORMAL LOW (ref 96–112)
Glucose, Bld: 140 mg/dL — ABNORMAL HIGH (ref 70–99)
Sodium: 134 mEq/L — ABNORMAL LOW (ref 135–145)

## 2013-02-28 MED ORDER — PNEUMOCOCCAL VAC POLYVALENT 25 MCG/0.5ML IJ INJ
0.5000 mL | INJECTION | INTRAMUSCULAR | Status: AC
  Administered 2013-03-01: 0.5 mL via INTRAMUSCULAR
  Filled 2013-02-28: qty 0.5

## 2013-02-28 MED ORDER — BIOTENE DRY MOUTH MT LIQD
15.0000 mL | Freq: Two times a day (BID) | OROMUCOSAL | Status: DC
  Administered 2013-03-01 – 2013-03-03 (×4): 15 mL via OROMUCOSAL

## 2013-02-28 NOTE — Progress Notes (Signed)
[facility] PROGRESS NOTE  Cesar Cantrell ZOX:[id number] DOB: 1966/03/26 DOA: 02/27/2013 PCP: Juliette Alcide, MD  Assessment/Plan: 1. Cellulitis - Agree with elevating affected extremity - continue broad spectrum antibiotics - Narrow antibiotic regimen with continued improvement likely to bactrim or doxycycline   2. DM - Amaryl, levemir, and SSI  3. OSA/Cor pulmonale - Will continue   4. Elevated Lactic acid - WNL on repeat and likely due to infectious etiology - continue to hold metformin.  5. Hypokalemia - resolved   Code Status: full Family Communication: no family at bedside.  Disposition Plan: Pending improvement in condition.   Consultants:  None  Procedures:  None  Antibiotics:  Vanc and Zosyn  HPI/Subjective: No acute issues overnight. No new complaints  Objective: Filed Vitals:   02/27/13 2019 02/27/13 2047 02/28/13 0920 02/28/13 1400  BP: 128/58 120/67 122/57 126/71  Pulse: 93 97 94 91  Temp: 99.4 F (37.4 C) 98.4 F (36.9 C) 98 F (36.7 C) 98.3 F (36.8 C)  TempSrc: Oral Oral Oral Oral  Resp: 16 16 18 20   Height:  5\' 9"  (1.753 m)    Weight:  130.1 kg (286 lb 13.1 oz)    SpO2: 93% 100% 94% 100%    Intake/Output Summary (Last 24 hours) at 02/28/13 1652 Last data filed at 02/28/13 1500  Gross per 24 hour  Intake   1020 ml  Output      0 ml  Net   1020 ml   Filed Weights   02/27/13 2047  Weight: 130.1 kg (286 lb 13.1 oz)    Exam:   General:  Pt in NAD, Alert and awake  Cardiovascular: RRR, no MRG  Respiratory: CTA BL, no wheezes  Abdomen: soft, NT, ND  Musculoskeletal: no cyanosis or clubbing  SKin: RLE cellulitis with callor and rubor from knee to dorsum of foot marked borders.   Data Reviewed: Basic Metabolic Panel:  Recent Labs Lab 02/27/13 1554 02/27/13 1601 02/28/13 0510  NA 131* 130* 134*  K 3.5 3.4* 4.5  CL 89* 87* 88*  CO2  --  29 35*  GLUCOSE 217* 198* 140*  BUN 14 14 17   CREATININE 0.90 0.83  1.03  CALCIUM  --  9.4 9.4   Liver Function Tests:  Recent Labs Lab 02/27/13 1601  AST 17  ALT 28  ALKPHOS 78  BILITOT 0.5  PROT 7.2  ALBUMIN 3.4*   No results found for this basename: LIPASE, AMYLASE,  in the last 168 hours No results found for this basename: AMMONIA,  in the last 168 hours CBC:  Recent Labs Lab 02/27/13 1554 02/27/13 1601 02/28/13 0510  WBC  --  20.1* 20.8*  NEUTROABS  --  18.9*  --   HGB 13.9 14.2 14.1  HCT 41.0 39.3 40.4  MCV  --  74.4* 77.4*  PLT  --  163 143*   Cardiac Enzymes: No results found for this basename: CKTOTAL, CKMB, CKMBINDEX, TROPONINI,  in the last 168 hours BNP (last 3 results)  Recent Labs  01/22/13 0500 01/25/13 0455 02/27/13 1601  PROBNP 1054.0* 294.8* 974.9*   CBG:  Recent Labs Lab 02/27/13 1509 02/27/13 2049 02/28/13 0735 02/28/13 1123  GLUCAP 205* 168* 155* 232*    Recent Results (from the past 240 hour(s))  CULTURE, BLOOD (ROUTINE X 2)     Status: None   Collection Time    02/27/13  4:00 PM      Result Value Range Status   Specimen Description BLOOD ARM  RIGHT   Final   Special Requests BOTTLES DRAWN AEROBIC AND ANAEROBIC 10CC   Final   Culture  Setup Time 02/27/2013 22:47   Final   Culture     Final   Value:        BLOOD CULTURE RECEIVED NO GROWTH TO DATE CULTURE WILL BE HELD FOR 5 DAYS BEFORE ISSUING A FINAL NEGATIVE REPORT   Report Status PENDING   Incomplete  CULTURE, BLOOD (ROUTINE X 2)     Status: None   Collection Time    02/27/13  4:10 PM      Result Value Range Status   Specimen Description BLOOD HAND RIGHT   Final   Special Requests BOTTLES DRAWN AEROBIC ONLY 7CC   Final   Culture  Setup Time 02/27/2013 22:48   Final   Culture     Final   Value:        BLOOD CULTURE RECEIVED NO GROWTH TO DATE CULTURE WILL BE HELD FOR 5 DAYS BEFORE ISSUING A FINAL NEGATIVE REPORT   Report Status PENDING   Incomplete     Studies: Dg Chest 2 View  02/27/2013   *RADIOLOGY REPORT*  Clinical Data: Leg  swelling, diabetes, hypertension, history of CABG  CHEST - 2 VIEW  Comparison: 01/26/2013; 01/22/2013; 09/22/2004; 09/13/2004  Findings: Grossly unchanged enlarged cardiac silhouette and mediastinal contours post median sternotomy and CABG. There are chronic findings of pulmonary venous congestion without definite evidence of superimposed pulmonary edema.  Minimal improved aeration of the bilateral lung bases with persistent bibasilar opacities, right greater than left.  No definite pleural effusion or pneumothorax.  Grossly unchanged bones.  IMPRESSION:  1.  Findings of chronic pulmonary venous congestion without definite superimposed pulmonary edema. 2.  Improved aeration of the lungs with persistent bibasilar opacities, right greater than left, likely atelectasis.   Original Report Authenticated By: Tacey Ruiz, MD    Scheduled Meds: . aspirin EC  81 mg Oral q morning - 10a  . atorvastatin  40 mg Oral q1800  . glimepiride  4 mg Oral BID WC  . heparin  5,000 Units Subcutaneous Q8H  . insulin aspart  0-15 Units Subcutaneous TID WC  . insulin detemir  5 Units Subcutaneous QHS  . ketoconazole   Topical BID  . piperacillin-tazobactam (ZOSYN)  IV  3.375 g Intravenous Q8H  . potassium chloride  10 mEq Oral BID  . sodium chloride  3 mL Intravenous Q12H  . torsemide  20 mg Oral BID  . vancomycin  1,250 mg Intravenous Q12H   Continuous Infusions:   Principal Problem:   Cellulitis Active Problems:   DM   HYPERCHOLESTEROLEMIA  IIA   Essential hypertension, benign   CAD, AUTOLOGOUS BYPASS GRAFT   Obesity hypoventilation syndrome   Cor pulmonale   OSA (obstructive sleep apnea)   Obesity   Tinea pedis   Lactic acidosis   Hypokalemia   Chronic respiratory failure    Time spent: > 35 min    Penny Pia  [facility] Pager [phone]. If 7PM-7AM, please contact night-coverage at [email or web address], password [facility] 02/28/2013, 4:52 PM  LOS: 1 day

## 2013-02-28 NOTE — Progress Notes (Signed)
Pt arrived on unit in stable condition. Vital signs are WNL. Pt has nasal O2 at 4L at 100%. All orders released, Pt oriented to unit and bed brakes locked. Will continue to monitor.

## 2013-02-28 NOTE — Progress Notes (Signed)
Elevated right lower extremity on two pillows. Urinal given to patient for output measurements.

## 2013-02-28 NOTE — Progress Notes (Signed)
Utilization review completed.  

## 2013-02-28 NOTE — Progress Notes (Signed)
    CARE MANAGEMENT NOTE 02/28/2013  Patient:  GLYN, GERADS   Account Number:  [id number]  Date Initiated:  02/28/2013  Documentation initiated by:  Darlyne Russian  Subjective/Objective Assessment:   Patient admitted with right lower leg swelling, elevated temp 100.2, WBC 20.  Recent INPT  6/11/t0 6/25 with cellulitis, respiratory failure, pulmonary hypertension.     Action/Plan:   Progression of care and discharge planning.   Anticipated DC Date:     Anticipated DC Plan:           Choice offered to / List presented to:             Status of service:  In process, will continue to follow Medicare Important Message given?   (If response is "NO", the following Medicare IM given date fields will be blank) Date Medicare IM given:   Date Additional Medicare IM given:    Discharge Disposition:    Per UR Regulation:  Reviewed for med. necessity/level of care/duration of stay  If discussed at Long Length of Stay Meetings, dates discussed:    Comments:  02/28/2013  [address] RN, [place]   [phone]  Spoke with patient regarding medical follow up.  He lives in [place] and has completed the paperwork for a government program, did not have any specific information.  He pays for his medications.   PCP: Dr Leandrew Koyanagi in [place], [place]. NCM to continue to follow for discharge needs.

## 2013-03-01 DIAGNOSIS — I2510 Atherosclerotic heart disease of native coronary artery without angina pectoris: Secondary | ICD-10-CM

## 2013-03-01 LAB — CBC WITH DIFFERENTIAL/PLATELET
Basophils Relative: 0 % (ref 0–1)
Eosinophils Absolute: 0 10*3/uL (ref 0.0–0.7)
Lymphs Abs: 1.1 10*3/uL (ref 0.7–4.0)
MCH: 26.7 pg (ref 26.0–34.0)
Neutro Abs: 16.9 10*3/uL — ABNORMAL HIGH (ref 1.7–7.7)
Neutrophils Relative %: 90 % — ABNORMAL HIGH (ref 43–77)
Platelets: 163 10*3/uL (ref 150–400)
RBC: 5.01 MIL/uL (ref 4.22–5.81)

## 2013-03-01 LAB — BASIC METABOLIC PANEL
Chloride: 90 mEq/L — ABNORMAL LOW (ref 96–112)
GFR calc Af Amer: >90 mL/min (ref >90)
GFR calc non Af Amer: >90 mL/min (ref >90)
Glucose, Bld: 177 mg/dL — ABNORMAL HIGH (ref 70–99)
Potassium: 3.7 mEq/L (ref 3.5–5.1)
Sodium: 134 mEq/L — ABNORMAL LOW (ref 135–145)

## 2013-03-01 NOTE — Progress Notes (Signed)
Placed patient on CPAP via auto-mode for the night with oxygen set at 4lpm and SP02=95% at this time

## 2013-03-01 NOTE — Progress Notes (Signed)
[facility] PROGRESS NOTE  Cesar Cantrell ZOX:[id number] DOB: 10-Aug-1965 DOA: 02/27/2013 PCP: Juliette Alcide, MD  Assessment/Plan: 1. Cellulitis - Agree with elevating affected extremity - continue broad spectrum antibiotics - Narrow antibiotic regimen with continued improvement likely to bactrim or doxycycline  - Continue current medical management. - Blood cultures pending but negative to date.  2. DM - Amaryl, levemir, and SSI  3. OSA/Cor pulmonale - Will continue   4. Elevated Lactic acid - WNL on repeat and likely due to infectious etiology - continue to hold metformin.  5. Hypokalemia - resolved   Code Status: full Family Communication: no family at bedside.  Disposition Plan: Pending improvement in condition.   Consultants:  None  Procedures:  None  Antibiotics:  Vanc and Zosyn  HPI/Subjective: No acute issues overnight. No new complaints  Objective: Filed Vitals:   03/01/13 0024 03/01/13 0443 03/01/13 0918 03/01/13 1305  BP:  132/68 118/61 120/68  Pulse: 85 87 95 89  Temp:  98.3 F (36.8 C) 98.3 F (36.8 C) 98.6 F (37 C)  TempSrc:  Oral    Resp: 20 18 20 19   Height:      Weight:      SpO2: 95% 96% 100% 100%    Intake/Output Summary (Last 24 hours) at 03/01/13 1319 Last data filed at 03/01/13 1305  Gross per 24 hour  Intake    720 ml  Output   1452 ml  Net   -732 ml   Filed Weights   02/27/13 2047 02/28/13 2144  Weight: 130.1 kg (286 lb 13.1 oz) 128.6 kg (283 lb 8.2 oz)    Exam:   General:  Pt in NAD, Alert and awake  Cardiovascular: RRR, no MRG  Respiratory: CTA BL, no wheezes  Abdomen: soft, NT, ND  Musculoskeletal: no cyanosis or clubbing  SKin: RLE cellulitis with callor and rubor from knee to dorsum of foot marked borders.   Data Reviewed: Basic Metabolic Panel:  Recent Labs Lab 02/27/13 1554 02/27/13 1601 02/28/13 0510 03/01/13 0520  NA 131* 130* 134* 134*  K 3.5 3.4* 4.5 3.7  CL 89* 87* 88* 90*   CO2  --  29 35* 36*  GLUCOSE 217* 198* 140* 177*  BUN 14 14 17 12   CREATININE 0.90 0.83 1.03 0.75  CALCIUM  --  9.4 9.4 9.3   Liver Function Tests:  Recent Labs Lab 02/27/13 1601  AST 17  ALT 28  ALKPHOS 78  BILITOT 0.5  PROT 7.2  ALBUMIN 3.4*   No results found for this basename: LIPASE, AMYLASE,  in the last 168 hours No results found for this basename: AMMONIA,  in the last 168 hours CBC:  Recent Labs Lab 02/27/13 1554 02/27/13 1601 02/28/13 0510 03/01/13 0520  WBC  --  20.1* 20.8* 18.8*  NEUTROABS  --  18.9*  --  16.9*  HGB 13.9 14.2 14.1 13.4  HCT 41.0 39.3 40.4 38.8*  MCV  --  74.4* 77.4* 77.4*  PLT  --  163 143* 163   Cardiac Enzymes: No results found for this basename: CKTOTAL, CKMB, CKMBINDEX, TROPONINI,  in the last 168 hours BNP (last 3 results)  Recent Labs  01/22/13 0500 01/25/13 0455 02/27/13 1601  PROBNP 1054.0* 294.8* 974.9*   CBG:  Recent Labs Lab 02/28/13 1123 02/28/13 1622 02/28/13 2149 03/01/13 0733 03/01/13 1156  GLUCAP 232* 195* 231* 156* 202*    Recent Results (from the past 240 hour(s))  CULTURE, BLOOD (ROUTINE X 2)  Status: None   Collection Time    02/27/13  4:00 PM      Result Value Range Status   Specimen Description BLOOD ARM RIGHT   Final   Special Requests BOTTLES DRAWN AEROBIC AND ANAEROBIC 10CC   Final   Culture  Setup Time 02/27/2013 22:47   Final   Culture     Final   Value:        BLOOD CULTURE RECEIVED NO GROWTH TO DATE CULTURE WILL BE HELD FOR 5 DAYS BEFORE ISSUING A FINAL NEGATIVE REPORT   Report Status PENDING   Incomplete  CULTURE, BLOOD (ROUTINE X 2)     Status: None   Collection Time    02/27/13  4:10 PM      Result Value Range Status   Specimen Description BLOOD HAND RIGHT   Final   Special Requests BOTTLES DRAWN AEROBIC ONLY 7CC   Final   Culture  Setup Time 02/27/2013 22:48   Final   Culture     Final   Value:        BLOOD CULTURE RECEIVED NO GROWTH TO DATE CULTURE WILL BE HELD FOR 5 DAYS  BEFORE ISSUING A FINAL NEGATIVE REPORT   Report Status PENDING   Incomplete     Studies: Dg Chest 2 View  02/27/2013   *RADIOLOGY REPORT*  Clinical Data: Leg swelling, diabetes, hypertension, history of CABG  CHEST - 2 VIEW  Comparison: 01/26/2013; 01/22/2013; 09/22/2004; 09/13/2004  Findings: Grossly unchanged enlarged cardiac silhouette and mediastinal contours post median sternotomy and CABG. There are chronic findings of pulmonary venous congestion without definite evidence of superimposed pulmonary edema.  Minimal improved aeration of the bilateral lung bases with persistent bibasilar opacities, right greater than left.  No definite pleural effusion or pneumothorax.  Grossly unchanged bones.  IMPRESSION:  1.  Findings of chronic pulmonary venous congestion without definite superimposed pulmonary edema. 2.  Improved aeration of the lungs with persistent bibasilar opacities, right greater than left, likely atelectasis.   Original Report Authenticated By: Tacey Ruiz, MD    Scheduled Meds: . antiseptic oral rinse  15 mL Mouth Rinse BID  . aspirin EC  81 mg Oral q morning - 10a  . atorvastatin  40 mg Oral q1800  . glimepiride  4 mg Oral BID WC  . heparin  5,000 Units Subcutaneous Q8H  . insulin aspart  0-15 Units Subcutaneous TID WC  . insulin detemir  5 Units Subcutaneous QHS  . ketoconazole   Topical BID  . piperacillin-tazobactam (ZOSYN)  IV  3.375 g Intravenous Q8H  . potassium chloride  10 mEq Oral BID  . sodium chloride  3 mL Intravenous Q12H  . torsemide  20 mg Oral BID  . vancomycin  1,250 mg Intravenous Q12H   Continuous Infusions:   Principal Problem:   Cellulitis Active Problems:   DM   HYPERCHOLESTEROLEMIA  IIA   Essential hypertension, benign   CAD, AUTOLOGOUS BYPASS GRAFT   Obesity hypoventilation syndrome   Cor pulmonale   OSA (obstructive sleep apnea)   Obesity   Tinea pedis   Lactic acidosis   Hypokalemia   Chronic respiratory failure    Time spent: > 35  min    Penny Pia  [facility] Pager [phone]. If 7PM-7AM, please contact night-coverage at [email or web address], password [facility] 03/01/2013, 1:19 PM  LOS: 2 days

## 2013-03-02 LAB — CBC
HCT: 38.7 % — ABNORMAL LOW (ref 39.0–52.0)
MCV: 78.7 fL (ref 78.0–100.0)
RBC: 4.92 MIL/uL (ref 4.22–5.81)
WBC: 10.8 10*3/uL — ABNORMAL HIGH (ref 4.0–10.5)

## 2013-03-02 MED ORDER — SULFAMETHOXAZOLE-TMP DS 800-160 MG PO TABS
1.0000 | ORAL_TABLET | Freq: Two times a day (BID) | ORAL | Status: DC
  Administered 2013-03-02 – 2013-03-03 (×3): 1 via ORAL
  Filled 2013-03-02 (×4): qty 1

## 2013-03-02 NOTE — Progress Notes (Signed)
Patient refused CPAP for the night. Patient on 4lpm with Sp02=95%

## 2013-03-02 NOTE — Progress Notes (Signed)
[facility] PROGRESS NOTE  CHAO BLAZEJEWSKI ZOX:[id number] DOB: 22-Jul-1966 DOA: 02/27/2013 PCP: Juliette Alcide, MD  Assessment/Plan: 1. Cellulitis - Agree with elevating affected extremity - initially placed on broad spectrum antibiotics. Will change to oral regimen today 03/02/13 - Blood cultures pending but negative to date.  2. DM - Amaryl, levemir, and SSI  3. OSA/Cor pulmonale - Will continue home regimen - patient reportedly refusing cpap  4. Elevated Lactic acid - WNL on repeat and likely due to infectious etiology - continue to hold metformin. - Has resolved.  5. Hypokalemia - resolved   Code Status: full Family Communication: no family at bedside.  Disposition Plan: Pending improvement in condition.   Consultants:  None  Procedures:  None  Antibiotics:  Vanc and Zosyn d/c 7/27  Bactrim 7/27  HPI/Subjective: No acute issues overnight. No new complaints  Objective: Filed Vitals:   03/01/13 2052 03/02/13 0540 03/02/13 0904 03/02/13 1319  BP: 133/63 128/59 133/59 129/67  Pulse: 84 84 84 84  Temp: 98.3 F (36.8 C) 98.5 F (36.9 C) 98.4 F (36.9 C) 98.6 F (37 C)  TempSrc: Oral Oral    Resp: 18 18 20 20   Height:      Weight: 130.4 kg (287 lb 7.7 oz)     SpO2: 99% 100% 97% 100%    Intake/Output Summary (Last 24 hours) at 03/02/13 1614 Last data filed at 03/02/13 1317  Gross per 24 hour  Intake   1340 ml  Output   4225 ml  Net  -2885 ml   Filed Weights   02/27/13 2047 02/28/13 2144 03/01/13 2052  Weight: 130.1 kg (286 lb 13.1 oz) 128.6 kg (283 lb 8.2 oz) 130.4 kg (287 lb 7.7 oz)    Exam:   General:  Pt in NAD, Alert and awake  Cardiovascular: RRR, no MRG  Respiratory: CTA BL, no wheezes  Abdomen: soft, NT, ND  Musculoskeletal: no cyanosis or clubbing  SKin: RLE cellulitis with callor and rubor from knee to dorsum of foot marked borders.   Data Reviewed: Basic Metabolic Panel:  Recent Labs Lab 02/27/13 1554  02/27/13 1601 02/28/13 0510 03/01/13 0520  NA 131* 130* 134* 134*  K 3.5 3.4* 4.5 3.7  CL 89* 87* 88* 90*  CO2  --  29 35* 36*  GLUCOSE 217* 198* 140* 177*  BUN 14 14 17 12   CREATININE 0.90 0.83 1.03 0.75  CALCIUM  --  9.4 9.4 9.3   Liver Function Tests:  Recent Labs Lab 02/27/13 1601  AST 17  ALT 28  ALKPHOS 78  BILITOT 0.5  PROT 7.2  ALBUMIN 3.4*   No results found for this basename: LIPASE, AMYLASE,  in the last 168 hours No results found for this basename: AMMONIA,  in the last 168 hours CBC:  Recent Labs Lab 02/27/13 1554 02/27/13 1601 02/28/13 0510 03/01/13 0520 03/02/13 1055  WBC  --  20.1* 20.8* 18.8* 10.8*  NEUTROABS  --  18.9*  --  16.9*  --   HGB 13.9 14.2 14.1 13.4 12.6*  HCT 41.0 39.3 40.4 38.8* 38.7*  MCV  --  74.4* 77.4* 77.4* 78.7  PLT  --  163 143* 163 184   Cardiac Enzymes: No results found for this basename: CKTOTAL, CKMB, CKMBINDEX, TROPONINI,  in the last 168 hours BNP (last 3 results)  Recent Labs  01/22/13 0500 01/25/13 0455 02/27/13 1601  PROBNP 1054.0* 294.8* 974.9*   CBG:  Recent Labs Lab 03/01/13 1156 03/01/13 1709 03/01/13 2051 03/02/13  1610 03/02/13 1115  GLUCAP 202* 146* 156* 134* 264*    Recent Results (from the past 240 hour(s))  CULTURE, BLOOD (ROUTINE X 2)     Status: None   Collection Time    02/27/13  4:00 PM      Result Value Range Status   Specimen Description BLOOD ARM RIGHT   Final   Special Requests BOTTLES DRAWN AEROBIC AND ANAEROBIC 10CC   Final   Culture  Setup Time 02/27/2013 22:47   Final   Culture     Final   Value:        BLOOD CULTURE RECEIVED NO GROWTH TO DATE CULTURE WILL BE HELD FOR 5 DAYS BEFORE ISSUING A FINAL NEGATIVE REPORT   Report Status PENDING   Incomplete  CULTURE, BLOOD (ROUTINE X 2)     Status: None   Collection Time    02/27/13  4:10 PM      Result Value Range Status   Specimen Description BLOOD HAND RIGHT   Final   Special Requests BOTTLES DRAWN AEROBIC ONLY [facility]   Final    Culture  Setup Time 02/27/2013 22:48   Final   Culture     Final   Value:        BLOOD CULTURE RECEIVED NO GROWTH TO DATE CULTURE WILL BE HELD FOR 5 DAYS BEFORE ISSUING A FINAL NEGATIVE REPORT   Report Status PENDING   Incomplete     Studies: No results found.  Scheduled Meds: . antiseptic oral rinse  15 mL Mouth Rinse BID  . aspirin EC  81 mg Oral q morning - 10a  . atorvastatin  40 mg Oral q1800  . glimepiride  4 mg Oral BID WC  . heparin  5,000 Units Subcutaneous Q8H  . insulin aspart  0-15 Units Subcutaneous TID WC  . insulin detemir  5 Units Subcutaneous QHS  . ketoconazole   Topical BID  . potassium chloride  10 mEq Oral BID  . sodium chloride  3 mL Intravenous Q12H  . sulfamethoxazole-trimethoprim  1 tablet Oral Q12H  . torsemide  20 mg Oral BID   Continuous Infusions:   Principal Problem:   Cellulitis Active Problems:   DM   HYPERCHOLESTEROLEMIA  IIA   Essential hypertension, benign   CAD, AUTOLOGOUS BYPASS GRAFT   Obesity hypoventilation syndrome   Cor pulmonale   OSA (obstructive sleep apnea)   Obesity   Tinea pedis   Lactic acidosis   Hypokalemia   Chronic respiratory failure    Time spent: > 35 min    Penny Pia  [facility] Pager [phone]. If 7PM-7AM, please contact night-coverage at [email or web address], password [facility] 03/02/2013, 4:14 PM  LOS: 3 days

## 2013-03-03 DIAGNOSIS — D72829 Elevated white blood cell count, unspecified: Secondary | ICD-10-CM

## 2013-03-03 DIAGNOSIS — R0902 Hypoxemia: Secondary | ICD-10-CM

## 2013-03-03 LAB — GLUCOSE, CAPILLARY: Glucose-Capillary: 123 mg/dL — ABNORMAL HIGH (ref 70–99)

## 2013-03-03 MED ORDER — OXYCODONE HCL 5 MG PO TABS: 5.0000 mg | ORAL_TABLET | ORAL | Status: DC | PRN

## 2013-03-03 MED ORDER — SULFAMETHOXAZOLE-TMP DS 800-160 MG PO TABS: 1.0000 | ORAL_TABLET | Freq: Two times a day (BID) | ORAL | Status: DC

## 2013-03-03 NOTE — Progress Notes (Signed)
Pt discharged to home after visit summary reviewed and pt capable of re verbalizing medications and follow up appointments. Pt remains stable. No signs and symptoms of distress. Educated to return to ER in the event of SOB, dizziness, chest pain, or fainting. Aviraj Kentner, RN   

## 2013-03-03 NOTE — Discharge Summary (Signed)
Physician Discharge Summary  Cesar Cantrell ZOX:[id number] DOB: 1965/08/20 DOA: 02/27/2013  PCP: Juliette Alcide, MD  Admit date: 02/27/2013 Discharge date: 03/03/2013  Time spent: > 35 minutes  Recommendations for Outpatient Follow-up:  1. Decide whether or not patient will require further antibiotic therapy.  Will treat for 14 days. Total 2. Monitor blood sugars  Discharge Diagnoses:  Principal Problem:   Cellulitis Active Problems:   DM   HYPERCHOLESTEROLEMIA  IIA   Essential hypertension, benign   CAD, AUTOLOGOUS BYPASS GRAFT   Obesity hypoventilation syndrome   Cor pulmonale   OSA (obstructive sleep apnea)   Obesity   Tinea pedis   Lactic acidosis   Hypokalemia   Chronic respiratory failure   Discharge Condition: stable  Diet recommendation: Diabetic diet  Filed Weights   02/28/13 2144 03/01/13 2052 03/02/13 2202  Weight: 128.6 kg (283 lb 8.2 oz) 130.4 kg (287 lb 7.7 oz) 128.5 kg (283 lb 4.7 oz)    History of present illness:  From original H and P: HPI: Cesar Cantrell is a 47 y.o. male with DM, cor pulmonale presents with leg pain, redness, swelling, since yesterday. Vomited x8 today. Chills. Right leg cellulitis a month ago. Dopplers negative for dvt. Wbc 20 K. Lactate >3. On metformin.   Hospital Course:  1. Cellulitis - Agree with elevating affected extremity  - initially placed on broad spectrum antibiotics. Changed to Bactrim and continued to show clinical improvement.  - Blood cultures pending but negative to date.   2. DM  - Will continue Amaryl at discharge - given recent elevated lactic acidosis will hold Metformin at discharge.  3. OSA/Cor pulmonale  - Will continue home regimen  - patient reportedly refusing cpap   4. Elevated Lactic acid: Given initial normal creatinine suspect was most likely due to infectious etiology but patient was on metformin at home. - WNL on repeat, will discontinue metformin on discharge.  - Has resolved.   5.  Hypokalemia  - resolved   Procedures:  None  Consultations:  none  Discharge Exam: Filed Vitals:   03/02/13 1629 03/02/13 2202 03/03/13 0543 03/03/13 0930  BP: 142/67 135/73 132/59 120/69  Pulse: 83 80 84 83  Temp: 98.3 F (36.8 C) 98.4 F (36.9 C) 98.3 F (36.8 C) 98.3 F (36.8 C)  TempSrc:  Oral Oral Oral  Resp: 18 18 20 20   Height:      Weight:  128.5 kg (283 lb 4.7 oz)    SpO2: 100% 98% 99% 99%    General: Pt in NAD, Alert and awake Cardiovascular: RRR, no MRG Respiratory: CTA BL, no wheezes, no increased work of breathing.  Discharge Instructions  Discharge Orders   Future Appointments Provider Department Dept Phone   03/10/2013 9:30 AM Julio Sicks, NP [facility] [phone]   04/28/2013 8:20 AM Jonelle Sidle, MD [facility] ([facility] [place] [phone]   Future Orders Complete By Expires     Call MD for:  redness, tenderness, or signs of infection (pain, swelling, redness, odor or green/yellow discharge around incision site)  As directed     Call MD for:  temperature >100.4  As directed     Diet - low sodium heart healthy  As directed     Discharge instructions  As directed     Comments:      Please take medication as indicated.   F/u with your primary care physician in 1-2 weeks or sooner should any new concerns arise.  Continue  to monitor your blood sugars q ac and qhs.    Increase activity slowly  As directed      Please stop taking: metFORMIN 1000 MG tablet  Commonly known as:  GLUCOPHAGE  Take 1,000 mg by mouth 2 (two) times daily with a meal.     Medication List         albuterol 108 (90 BASE) MCG/ACT inhaler  Commonly known as:  PROVENTIL HFA;VENTOLIN HFA  Inhale 2 puffs into the lungs every 6 (six) hours as needed for wheezing.     aspirin EC 81 MG tablet  Take 81 mg by mouth every morning.     glimepiride 4 MG tablet  Commonly known as:  AMARYL  Take 4 mg by mouth 2 (two) times daily with a meal.               oxyCODONE 5 MG immediate release tablet  Commonly known as:  Oxy IR/ROXICODONE  Take 1-2 tablets (5-10 mg total) by mouth every 4 (four) hours as needed.     simvastatin 80 MG tablet  Commonly known as:  ZOCOR  Take 40 mg by mouth at bedtime.     sulfamethoxazole-trimethoprim 800-160 MG per tablet  Commonly known as:  BACTRIM DS  Take 1 tablet by mouth every 12 (twelve) hours.     torsemide 20 MG tablet  Commonly known as:  DEMADEX  Take 1 tablet (20 mg total) by mouth 2 (two) times daily.       No Known Allergies    The results of significant diagnostics from this hospitalization (including imaging, microbiology, ancillary and laboratory) are listed below for reference.    Significant Diagnostic Studies: Dg Chest 2 View  02/27/2013   *RADIOLOGY REPORT*  Clinical Data: Leg swelling, diabetes, hypertension, history of CABG  CHEST - 2 VIEW  Comparison: 01/26/2013; 01/22/2013; 09/22/2004; 09/13/2004  Findings: Grossly unchanged enlarged cardiac silhouette and mediastinal contours post median sternotomy and CABG. There are chronic findings of pulmonary venous congestion without definite evidence of superimposed pulmonary edema.  Minimal improved aeration of the bilateral lung bases with persistent bibasilar opacities, right greater than left.  No definite pleural effusion or pneumothorax.  Grossly unchanged bones.  IMPRESSION:  1.  Findings of chronic pulmonary venous congestion without definite superimposed pulmonary edema. 2.  Improved aeration of the lungs with persistent bibasilar opacities, right greater than left, likely atelectasis.   Original Report Authenticated By: Tacey Ruiz, MD    Microbiology: Recent Results (from the past 240 hour(s))  CULTURE, BLOOD (ROUTINE X 2)     Status: None   Collection Time    02/27/13  4:00 PM      Result Value Range Status   Specimen Description BLOOD ARM RIGHT   Final   Special Requests BOTTLES DRAWN AEROBIC AND ANAEROBIC 10CC    Final   Culture  Setup Time 02/27/2013 22:47   Final   Culture     Final   Value:        BLOOD CULTURE RECEIVED NO GROWTH TO DATE CULTURE WILL BE HELD FOR 5 DAYS BEFORE ISSUING A FINAL NEGATIVE REPORT   Report Status PENDING   Incomplete  CULTURE, BLOOD (ROUTINE X 2)     Status: None   Collection Time    02/27/13  4:10 PM      Result Value Range Status   Specimen Description BLOOD HAND RIGHT   Final   Special Requests BOTTLES DRAWN AEROBIC ONLY 7CC   Final  Culture  Setup Time 02/27/2013 22:48   Final   Culture     Final   Value:        BLOOD CULTURE RECEIVED NO GROWTH TO DATE CULTURE WILL BE HELD FOR 5 DAYS BEFORE ISSUING A FINAL NEGATIVE REPORT   Report Status PENDING   Incomplete     Labs: Basic Metabolic Panel:  Recent Labs Lab 02/27/13 1554 02/27/13 1601 02/28/13 0510 03/01/13 0520  NA 131* 130* 134* 134*  K 3.5 3.4* 4.5 3.7  CL 89* 87* 88* 90*  CO2  --  29 35* 36*  GLUCOSE 217* 198* 140* 177*  BUN 14 14 17 12   CREATININE 0.90 0.83 1.03 0.75  CALCIUM  --  9.4 9.4 9.3   Liver Function Tests:  Recent Labs Lab 02/27/13 1601  AST 17  ALT 28  ALKPHOS 78  BILITOT 0.5  PROT 7.2  ALBUMIN 3.4*   No results found for this basename: LIPASE, AMYLASE,  in the last 168 hours No results found for this basename: AMMONIA,  in the last 168 hours CBC:  Recent Labs Lab 02/27/13 1554 02/27/13 1601 02/28/13 0510 03/01/13 0520 03/02/13 1055  WBC  --  20.1* 20.8* 18.8* 10.8*  NEUTROABS  --  18.9*  --  16.9*  --   HGB 13.9 14.2 14.1 13.4 12.6*  HCT 41.0 39.3 40.4 38.8* 38.7*  MCV  --  74.4* 77.4* 77.4* 78.7  PLT  --  163 143* 163 184   Cardiac Enzymes: No results found for this basename: CKTOTAL, CKMB, CKMBINDEX, TROPONINI,  in the last 168 hours BNP: BNP (last 3 results)  Recent Labs  01/22/13 0500 01/25/13 0455 02/27/13 1601  PROBNP 1054.0* 294.8* 974.9*   CBG:  Recent Labs Lab 03/02/13 0723 03/02/13 1115 03/02/13 1631 03/02/13 2204 03/03/13 0727   GLUCAP 134* 264* 125* 84 123*       Signed:  Penny Pia  [facility] 03/03/2013, 12:54 PM

## 2013-03-03 NOTE — Progress Notes (Signed)
Patient refused CPAP for the night. Patient on 4lpm with SP02=95%

## 2013-03-04 ENCOUNTER — Other Ambulatory Visit: Payer: Self-pay | Admitting: Emergency Medicine

## 2013-03-04 LAB — GLUCOSE, CAPILLARY

## 2013-03-05 LAB — CULTURE, BLOOD (ROUTINE X 2): Culture: NO GROWTH

## 2013-03-05 NOTE — Telephone Encounter (Signed)
Yes x 1 refill

## 2013-03-05 NOTE — Telephone Encounter (Signed)
Dr. Vassie Loll-- would you like to fill this Rx? Last filled by Carlean Jews 6/13 on discharge Please advise, thank you

## 2013-03-10 ENCOUNTER — Ambulatory Visit (INDEPENDENT_AMBULATORY_CARE_PROVIDER_SITE_OTHER): Payer: Medicaid - Out of State | Admitting: Adult Health

## 2013-03-10 ENCOUNTER — Encounter: Payer: Self-pay | Admitting: Adult Health

## 2013-03-10 VITALS — BP 130/78 | HR 85 | Temp 97.8°F | Ht 69.0 in | Wt 271.4 lb

## 2013-03-10 DIAGNOSIS — G4733 Obstructive sleep apnea (adult) (pediatric): Secondary | ICD-10-CM

## 2013-03-10 DIAGNOSIS — L0390 Cellulitis, unspecified: Secondary | ICD-10-CM

## 2013-03-10 DIAGNOSIS — L0291 Cutaneous abscess, unspecified: Secondary | ICD-10-CM

## 2013-03-10 NOTE — Assessment & Plan Note (Addendum)
OSA/OHS   Plan  Continue on BIPAP At bedtime   Weight loss Low salt diet  Keep legs elevated  Follow up with family doctor as planned and As needed   Please contact office for sooner follow up if symptoms do not improve or worsen or seek emergency care  follow up Dr. Vassie Loll  In 6 weeks and As needed   BiPAP download requested

## 2013-03-10 NOTE — Progress Notes (Signed)
Subjective:    Patient ID: Cesar Cantrell, male    DOB: [birth year], 47 y.o.   MRN: [id number]  HPI  PCP  -Griselda Miner   47 year old obese smoker, Adm 6/13-6/25/14  directly from PCP's office marked bilateral lower extremity edema and  erythema, worrisome for cellulitis.There was also concern for cor pulmonale and right heart failure. He developed acute respiratory failure, with ABG suggestive of uncompensated respiratory acidosis: pH 7.21, pCO2 98, pO2 63,  and bicarb 39, on 5L. Patient was transferred to the intensive care unit, where he was placed on BiPAP.   He also is noted to have a rash, but states that he has had it for "30 years."     He has a  history of multivessel CAD, status post acute inferior myocardial infarction with associated cardiogenic shock in February 2006, initially treated with emergent drug-eluting stenting of a 100% occluded mid RCA stenosis, followed by 5-vessel CABG, by Dr. Charlett Lango. Initial ejection fraction 39% by cardiac catheterization, but subsequent normalization, as assessed by most recent  echocardiogram (EF 55% to 60%), in 2011.    SIGNIFICANT EVENTS / STUDIES:  6/13 Admitted in transfer from [facility] to Cards service  6/13 Echo: LVEF 55-60%, mild LA enlargement, mod RA enlargement, mild to mod RV dilatation, estimated RVSP 62 mmHg  6/16 RHC: RV: 87/9 mm Hg, PA 84/81 mm Hg with mean 57 mm Hg, PCWP 25 mm Hg. Fick Cardiac Output: 5.99 L/minute ,2.43 L/min/M2, severe pulmonary hypertension  6/19 BLE Doppler: Negative for DVTs bilaterally   COURSE:   A: Acute on chronic respiratory failure  Chronic Respiratory Acidosis.  Severe Pulmonary Hypertension by RHC-6/17  Hypoxia - felt to be a Combination of atelectasis & ? Obstructive lung disease with severe PH causing VQ mismatch  Suspect decompensated OHS  History compatible with OSA - not formally diagnosed  Smoker  P:  He was discharged on bipap nocturnal with 4L O2  CARDIOVASCULAR   A:  CAD  H/O CHF  Hyperlipidemia  Cor pulmonale  There was  Question of old vegetation on aortic valve Severe pulm hypertension - combination of hypoxia/OSA/ lung disease/ diastolic dysfunction/ L sided disease (PAOP 25)   P:  - torsemide as ordered  -He was taken off ACE-I, coreg,  - not a good candidate for TEE given overall resp status,  deferred  Met alkalosis due to loop diuresis (was on diamox)  He was treated with IV antibiotics for treatment of possible cellulitis.   He diuresed well, compliant with bipap & O2 since dc , has come off O2 daytime (by himself) & has quit smoking He however desatn to 85% on walking after first lap   03/10/2013 Follow up  Patient returns for a one-month followup for OHS, and OSA Patient underwent a sleep study on July 17 that confirmed his underlying severe obstructive sleep apnea with associated obesity hypoventilation syndrome with an AHI of 63. Patient did require supplemental oxygen with his BiPAP . Due to persistent desaturations on BiPAP. Patient's BiPAP was set at 13/9 with 2 L of oxygen Patient reports that he is doing very well on BiPAP. Every night for average of 6 hours plus. Unfortunately, patient was recently hospitalized last week with her right lower leg cellulitis. Venous Dopplers were negative for DVT. Patient is currently on antibiotics, with planned followup with his family doctor, later this week. Patient denies any chest pain, orthopnea, PND  Past Medical History  Diagnosis Date  . Coronary artery disease   .  Hypertension   . Pneumonia   . Pulmonary hypertension   . Diabetes mellitus     type 2  . Sleep apnea     Past Surgical History  Procedure Laterality Date  . Coronary artery bypass graft  09-20-04    x5 LIMA to LAD, left radial to D1, SVG to ramus and SVG to PDA/PLA     Review of Systems   neg for any significant sore throat, dysphagia, itching, sneezing, nasal congestion or excess/ purulent secretions,  fever, chills, sweats, unintended wt loss, pleuritic or exertional cp, hempoptysis, orthopnea pnd or change in chronic leg swelling. Also denies presyncope, palpitations, heartburn, abdominal pain, nausea, vomiting, diarrhea or change in bowel or urinary habits, dysuria,hematuria, rash, arthralgias, visual complaints, headache, numbness weakness or ataxia.     Objective:   Physical Exam   Gen. Pleasant, obese, in no distress, normal affect ENT - no lesions, no post nasal drip, class 2-3 airway Neck: No JVD, no thyromegaly, no carotid bruits Lungs: no use of accessory muscles, no dullness to percussion, decreased without rales or rhonchi  Cardiovascular: Rhythm regular, heart sounds  normal, no murmurs or gallops, 1+ peripheral edema R>L with redness along right calf  Abdomen: soft and non-tender, no hepatosplenomegaly, BS normal. Musculoskeletal: No deformities, no cyanosis or clubbing Neuro:  alert, non focal, no tremors       Assessment & Plan:

## 2013-03-10 NOTE — Assessment & Plan Note (Signed)
Follow up with family doctor as planned. This week.

## 2013-03-10 NOTE — Patient Instructions (Addendum)
Continue on BIPAP At bedtime   Weight loss Low salt diet  Keep legs elevated  Follow up with family doctor as planned and As needed   Please contact office for sooner follow up if symptoms do not improve or worsen or seek emergency care  follow up Dr. Vassie Loll  In 6 weeks and As needed

## 2013-03-18 ENCOUNTER — Telehealth: Payer: Self-pay | Admitting: Pulmonary Disease

## 2013-03-18 NOTE — Telephone Encounter (Signed)
ATC work # and it rang several times then received a signal like it was a fax # I called home # and line busy x3 [facility]

## 2013-03-19 NOTE — Telephone Encounter (Signed)
Stay on 2L O2 on bipap OK to send report to PCP

## 2013-03-19 NOTE — Telephone Encounter (Signed)
ATC mother # listed and received fast busy signal. [facility]

## 2013-03-19 NOTE — Telephone Encounter (Signed)
Called, spoke with pt's mother - Requesting results of sleep study and would like to know if o2 needs to changed based on these results.  States pt is currently on o2 during the day and bipap.  Sleep study was done on 02/20/13.  Dr. Vassie Loll, pls advise.  Thank you.  ** Mother also requesting results and recs to be faxed to Dr. [name] office.

## 2013-03-20 NOTE — Telephone Encounter (Signed)
I spoke with mother and is aware of recs. Nothing further was needed

## 2013-03-21 ENCOUNTER — Ambulatory Visit: Payer: Self-pay | Admitting: Pulmonary Disease

## 2013-04-22 ENCOUNTER — Encounter: Payer: Self-pay | Admitting: Cardiology

## 2013-04-22 DIAGNOSIS — I272 Pulmonary hypertension, unspecified: Secondary | ICD-10-CM | POA: Insufficient documentation

## 2013-04-28 ENCOUNTER — Encounter: Payer: Self-pay | Admitting: Cardiology

## 2013-04-28 ENCOUNTER — Ambulatory Visit (INDEPENDENT_AMBULATORY_CARE_PROVIDER_SITE_OTHER): Payer: Medicaid - Out of State | Admitting: Cardiology

## 2013-04-28 VITALS — BP 126/80 | HR 91 | Ht 69.0 in | Wt 264.0 lb

## 2013-04-28 DIAGNOSIS — I2510 Atherosclerotic heart disease of native coronary artery without angina pectoris: Secondary | ICD-10-CM

## 2013-04-28 DIAGNOSIS — I10 Essential (primary) hypertension: Secondary | ICD-10-CM

## 2013-04-28 DIAGNOSIS — E782 Mixed hyperlipidemia: Secondary | ICD-10-CM

## 2013-04-28 DIAGNOSIS — I272 Pulmonary hypertension, unspecified: Secondary | ICD-10-CM

## 2013-04-28 DIAGNOSIS — I2789 Other specified pulmonary heart diseases: Secondary | ICD-10-CM

## 2013-04-28 NOTE — Assessment & Plan Note (Signed)
No progressive angina symptoms with multivessel disease status post CABG as outlined. Plan to continue medical therapy and observation for now.

## 2013-04-28 NOTE — Assessment & Plan Note (Signed)
Likely mixed picture, pulmonary arterial greater than pulmonary venous based on workup so far. Patient seen by Dr. Gala Romney around time of right heart catheterization in June. He seems to be feeling better symptomatically with combination of diuretic and also BiPAP at nighttime for treatment of OSA and hypoventilation syndrome. His weight is down reflecting diuresis most likely. Followup echocardiogram to be obtained for reassessment of pulmonary pressures. If these remain elevated by noninvasive measures, can refer back to the CHF clinic. Followup arranged.

## 2013-04-28 NOTE — Progress Notes (Signed)
Clinical Summary Mr. Drum is a medically complex 47 y.o.male presenting for office visit. This is my first meeting with him. He is a former patient of Dr. Riley Kill, last seen in March of this year.  History is reviewed below. He denies any regular angina symptoms, has NYHA class 2-3 dyspnea. Does state he feels better since using BiPAP and oxygen at nighttime. He uses oxygen occasionally with activity during the daytime. Reports general improvement in his leg edema, right chronically worse than left. Weight is down 7 pounds from August.  Recent visit with Pulmonary noted. Patient is on BiPAP at nighttime with OSA, obesity hypoventilation syndrome, prior documentation of severe pulmonary hypertension. Record review finds hospitalization in July with cellulitis.  Right heart catheterization in June of the issue revealed PASP 84 mmHg, PCWP 25 mm mercury. He was seen by Dr. 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 Was managed with combination of diuresis and also CPAP. Based on review of the progress notes, followup in the CHF clinic was anticipated with plan to consider repeat right heart catheterization ultimately.  Recent lab work in late July showed sodium 134, potassium 3.7, BUN 12, creatinine 0.7.   No Known Allergies  Current Outpatient Prescriptions  Medication Sig Dispense Refill  . albuterol (PROVENTIL HFA;VENTOLIN HFA) 108 (90 BASE) MCG/ACT inhaler Inhale 2 puffs into the lungs every 6 (six) hours as needed for wheezing.  1 Inhaler  2  . aspirin EC 81 MG tablet Take 81 mg by mouth every morning.      Marland Kitchen glimepiride (AMARYL) 4 MG tablet Take 4 mg by mouth 2 (two) times daily with a meal.      . simvastatin (ZOCOR) 80 MG tablet Take 40 mg by mouth at bedtime.       . torsemide (DEMADEX) 20 MG tablet TAKE ONE TABLET BY MOUTH TWICE DAILY  60 tablet  1   No current facility-administered medications for this visit.    Past Medical History    Diagnosis Date  . Coronary atherosclerosis of native coronary artery     Multivessel status post CABG  . Essential hypertension, benign   . Pneumonia   . Pulmonary hypertension     PASP 80 mmHg at [facility] 01/2013  . Type 2 diabetes mellitus   . Sleep apnea     On BIPAP  . Cor pulmonale   . Obesity hypoventilation syndrome     Past Surgical History  Procedure Laterality Date  . Coronary artery bypass graft  09-20-04    LIMA to LAD, left radial to D1, SVG to ramus and SVG to PDA/PLA    Family History  Problem Relation Age of Onset  . Heart disease Other 60    Grandmother  . Coronary artery disease      No hx of premature heart disease    Social History Mr. Petrenko reports that he quit smoking about 3 months ago. His smoking use included Cigarettes. He has a 45 pack-year smoking history. He has never used smokeless tobacco. Mr. Gettel reports that he does not drink alcohol.  Review of Systems No palpitations, syncope. Chronic leg edema, right worse than left. No orthopnea. Stable appetite. Sporadic angina. Otherwise negative to  Physical Examination Filed Vitals:   04/28/13 0824  BP: 126/80  Pulse: 91   Filed Weights   04/28/13 0824  Weight: 264 lb (119.75 kg)   Obese male, comfortable at rest. HEENT: Conjunctiva and lids normal, oropharynx clear with  moist mucosa. Neck: Supple, increased girth, no carotid bruits, no thyromegaly. Lungs: Clear to auscultation, nonlabored breathing at rest. Cardiac: Regular rate and rhythm, no S3, soft apical systolic murmur, no pericardial rub. Abdomen: Soft, nontender, protuberant, bowel sounds present, no guarding or rebound. Extremities: Chronic appearing firm edema, right worse than left with stasis, distal pulses 1-2+. Skin: Warm and dry. Scattered tattoos. Musculoskeletal: No kyphosis. Neuropsychiatric: Alert and oriented x3, affect grossly appropriate.   Problem List and Plan   Coronary atherosclerosis of native coronary  artery No progressive angina symptoms with multivessel disease status post CABG as outlined. Plan to continue medical therapy and observation for now.  Pulmonary hypertension Likely mixed picture, pulmonary arterial greater than pulmonary venous based on workup so far. Patient seen by Dr. Gala Romney around time of right heart catheterization in June. He seems to be feeling better symptomatically with combination of diuretic and also BiPAP at nighttime for treatment of OSA and hypoventilation syndrome. His weight is down reflecting diuresis most likely. Followup echocardiogram to be obtained for reassessment of pulmonary pressures. If these remain elevated by noninvasive measures, can refer back to the CHF clinic. Followup arranged.  Essential hypertension, benign Blood pressure is reasonably well controlled today.  Mixed hyperlipidemia Continues on Zocor, followed by Dr. Leandrew Koyanagi.    Jonelle Sidle, M.D., F.A.C.C.

## 2013-04-28 NOTE — Assessment & Plan Note (Signed)
Continues on Zocor, followed by Dr. Burdine. 

## 2013-04-28 NOTE — Assessment & Plan Note (Signed)
Blood pressure is reasonably well controlled today. 

## 2013-04-28 NOTE — Patient Instructions (Signed)
Your physician has requested that you have an echocardiogram. Echocardiography is a painless test that uses sound waves to create images of your heart. It provides your doctor with information about the size and shape of your heart and how well your heart's chambers and valves are working. This procedure takes approximately one hour. There are no restrictions for this procedure. Office will contact with results via phone or letter.   Continue all current medications. Follow up in  3 months   

## 2013-04-30 ENCOUNTER — Encounter: Payer: Self-pay | Admitting: Pulmonary Disease

## 2013-04-30 ENCOUNTER — Ambulatory Visit (INDEPENDENT_AMBULATORY_CARE_PROVIDER_SITE_OTHER): Payer: Medicaid - Out of State | Admitting: Pulmonary Disease

## 2013-04-30 VITALS — BP 120/70 | HR 84 | Temp 97.8°F | Ht 69.0 in | Wt 284.6 lb

## 2013-04-30 DIAGNOSIS — I279 Pulmonary heart disease, unspecified: Secondary | ICD-10-CM

## 2013-04-30 DIAGNOSIS — Z23 Encounter for immunization: Secondary | ICD-10-CM

## 2013-04-30 DIAGNOSIS — G4733 Obstructive sleep apnea (adult) (pediatric): Secondary | ICD-10-CM

## 2013-04-30 DIAGNOSIS — I2781 Cor pulmonale (chronic): Secondary | ICD-10-CM

## 2013-04-30 NOTE — Progress Notes (Signed)
Subjective:    Patient ID: Cesar Cantrell, male    DOB: 1966/07/25, 47 y.o.   MRN: [id number]  HPI  PCP -Griselda Miner   47 year old obese smoker, with OSA/OHS, severe pulm htn & cor pulmonale Adm 6/13-6/25/14 directly from PCP's office marked bilateral lower extremity edema , cor pulmonale and right heart failure.  He developed acute hypercarbic respiratory failure, with ABG pH 7.21, pCO2 98, pO2 63 and bicarb 39, on 5L. 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 Charlett Lango. Initial ejection fraction 39% by cardiac catheterization, but subsequent normalization, as assessed by most recent  echocardiogram (EF 55% to 60%), in 2011.   6/13 Echo: LVEF 55-60%, mild LA enlargement, mod RA enlargement, mild to mod RV dilatation, estimated RVSP 62 mmHg  6/16 RHC: RV: 87/9 mm Hg, PA 84/81 mm Hg with mean 57 mm Hg, PCWP 25 mm Hg. Fick Cardiac Output: 5.99 L/minute ,2.43 L/min/M2, severe pulmonary hypertension  6/19 BLE Doppler: Negative for DVTs bilaterally   He was discharged on bipap nocturnal with 4L O2  He diuresed well, compliant with bipap & O2 since dc , has come off O2 daytime (by himself) & has quit smoking   PSG on July 2014  confirmed his underlying severe obstructive sleep apnea with associated obesity hypoventilation syndrome with an AHI of 63. He did require supplemental oxygen with his BiPAP . Due to persistent desaturations on BiPAP.  He was set at 13/9 with 2 L of oxygen  Patient reports that he is doing very well on BiPAP. Every night for average of 6 hours plus. He was again recently hospitalized last week with her right lower leg cellulitis. Venous Dopplers were negative for  DVT.  04/30/2013 Pt reports his breathing has uchanged. Has very little cough w/ occasional clear phlem. Denies any wheezing and no chest tx. He uses his rescue inhaler about once a week but that just depends if he has over done himself.  Pt wears the BIPAP w/ 3 liters O2 everynight x 6-7 hrs a night. denies any problems w/ mahcine/mask. He feels rested most days. He still has some days he does not feel like doing anything bc he felt like he didn't sleep at all.  Has quit smoking 01/2013 Wt unchanged Spirometry Fev1 60%, ratio 82, no BD response Continues to desatn on walking -2nd lap, recovers quickly  Review of Systems neg for any significant sore throat, dysphagia, itching, sneezing, nasal congestion or excess/ purulent secretions, fever, chills, sweats, unintended wt loss, pleuritic or exertional cp, hempoptysis, orthopnea pnd or change in chronic leg swelling. Also denies presyncope, palpitations, heartburn, abdominal pain, nausea, vomiting, diarrhea or change in bowel or urinary habits, dysuria,hematuria, rash, arthralgias, visual complaints, headache, numbness weakness or ataxia.      Objective:   Physical Exam  Gen. Pleasant, obese, in no distress ENT - no lesions, no post nasal drip Neck: No JVD, no thyromegaly, no carotid bruits Lungs: no use of accessory muscles, no dullness to percussion, decreased without rales or rhonchi  Cardiovascular: Rhythm regular, heart sounds  normal, no murmurs or gallops, no peripheral edema Musculoskeletal: No deformities, no cyanosis or clubbing , no tremors       Assessment &  Plan:

## 2013-04-30 NOTE — Patient Instructions (Addendum)
Flu shot Ambulatory satn Download on your machine

## 2013-05-01 ENCOUNTER — Other Ambulatory Visit: Payer: Self-pay

## 2013-05-01 ENCOUNTER — Other Ambulatory Visit: Payer: Self-pay | Admitting: Pulmonary Disease

## 2013-05-01 ENCOUNTER — Other Ambulatory Visit (INDEPENDENT_AMBULATORY_CARE_PROVIDER_SITE_OTHER): Payer: Medicaid - Out of State

## 2013-05-01 DIAGNOSIS — I272 Pulmonary hypertension, unspecified: Secondary | ICD-10-CM

## 2013-05-01 DIAGNOSIS — I2510 Atherosclerotic heart disease of native coronary artery without angina pectoris: Secondary | ICD-10-CM

## 2013-05-01 DIAGNOSIS — I2789 Other specified pulmonary heart diseases: Secondary | ICD-10-CM

## 2013-05-01 NOTE — Assessment & Plan Note (Signed)
bipap 13/9 with 2 L O2 blended Weight loss encouraged, compliance with goal of at least 4-6 hrs every night is the expectation. Advised against medications with sedative side effects Cautioned against driving when sleepy - understanding that sleepiness will vary on a day to day basis

## 2013-05-01 NOTE — Assessment & Plan Note (Signed)
Ct demadex  Seems like he ideally needs O2 on exertion although ok off rest

## 2013-05-02 NOTE — Telephone Encounter (Signed)
Pt is requesting refill on torsemide. This was last refilled 03/04/13 #60 x 1 refill. Please advise thanks

## 2013-05-02 NOTE — Telephone Encounter (Signed)
OK x 2 refills Further refills from PCP

## 2013-05-06 ENCOUNTER — Telehealth: Payer: Self-pay | Admitting: *Deleted

## 2013-05-06 NOTE — Telephone Encounter (Signed)
Message copied by Eustace Moore on Tue May 06, 2013  4:18 PM ------      Message from: Jonelle Sidle      Created: Fri May 02, 2013  7:51 AM       Reviewed. LVEF remains overall normal range with diastolic dysfunction as noted previously. Pulmonary artery systolic pressure measures much lower. This is a good finding. We will continue to follow him as scheduled. ------

## 2013-05-06 NOTE — Telephone Encounter (Signed)
Patient informed. 

## 2013-07-23 ENCOUNTER — Encounter: Payer: Self-pay | Admitting: Cardiology

## 2013-07-23 ENCOUNTER — Ambulatory Visit (INDEPENDENT_AMBULATORY_CARE_PROVIDER_SITE_OTHER): Payer: Medicaid - Out of State | Admitting: Cardiology

## 2013-07-23 VITALS — BP 150/81 | HR 89 | Ht 69.0 in | Wt 292.0 lb

## 2013-07-23 DIAGNOSIS — I10 Essential (primary) hypertension: Secondary | ICD-10-CM

## 2013-07-23 DIAGNOSIS — I2510 Atherosclerotic heart disease of native coronary artery without angina pectoris: Secondary | ICD-10-CM

## 2013-07-23 DIAGNOSIS — I2789 Other specified pulmonary heart diseases: Secondary | ICD-10-CM

## 2013-07-23 DIAGNOSIS — I272 Pulmonary hypertension, unspecified: Secondary | ICD-10-CM

## 2013-07-23 NOTE — Patient Instructions (Signed)
Continue all current medications. Your physician wants you to follow up in: 6 months.  You will receive a reminder letter in the mail one-two months in advance.  If you don't receive a letter, please call our office to schedule the follow up appointment   

## 2013-07-23 NOTE — Progress Notes (Signed)
Clinical Summary Cesar Cantrell is a 47 y.o.male last seen in September, a former patient of Dr. Riley Kill. He continues to follow in the pulmonary clinic, states he will be seen in January.  He reports "good days and bad days." No accelerating angina symptoms. Stable dyspnea on exertion. He recently got a dog and has been walking regularly, sometimes up to 2 miles. He states that he is using BiPAP regularly at nighttime.  Most recent echocardiogram in September showed mild LVH with LVEF 55-60%, grade 2 diastolic dysfunction, trivial mitral regurgitation, mild left atrial enlargement, moderately dilated right ventricle with mildly reduced contraction, trivial tricuspid regurgitation, PASP 33 mm mercury. This was a decrease in pulmonary pressure.  No Known Allergies  Current Outpatient Prescriptions  Medication Sig Dispense Refill  . albuterol (PROVENTIL HFA;VENTOLIN HFA) 108 (90 BASE) MCG/ACT inhaler Inhale 2 puffs into the lungs every 6 (six) hours as needed for wheezing.  1 Inhaler  2  . aspirin EC 81 MG tablet Take 81 mg by mouth every morning.      Marland Kitchen glimepiride (AMARYL) 4 MG tablet Take 4 mg by mouth 2 (two) times daily with a meal.      . Omega-3 Fatty Acids (FISH OIL) 1200 MG CAPS Take 1 capsule by mouth daily.       . simvastatin (ZOCOR) 80 MG tablet Take 40 mg by mouth at bedtime.       . torsemide (DEMADEX) 20 MG tablet TAKE ONE TABLET BY MOUTH TWICE DAILY  60 tablet  2   No current facility-administered medications for this visit.    Past Medical History  Diagnosis Date  . Coronary atherosclerosis of native coronary artery     Multivessel status post CABG  . Essential hypertension, benign   . Pneumonia   . Pulmonary hypertension     PASP 80 mmHg at [facility] 01/2013  . Type 2 diabetes mellitus   . Sleep apnea     On BIPAP  . Cor pulmonale   . Obesity hypoventilation syndrome     Past Surgical History  Procedure Laterality Date  . Coronary artery bypass graft  09-20-04   LIMA to LAD, left radial to D1, SVG to ramus and SVG to PDA/PLA    Social History Cesar Cantrell reports that he quit smoking about 6 months ago. His smoking use included Cigarettes. He has a 45 pack-year smoking history. He has never used smokeless tobacco. Cesar Cantrell reports that he does not drink alcohol.  Review of Systems No palpitations or syncope. Reasonable control of swelling with Demadex. Numbness in his hands. Status post CABG in 2006, Stable appetite. Otherwise negative.  Physical Examination Filed Vitals:   07/23/13 1359  BP: 150/81  Pulse: 89   Filed Weights   07/23/13 1359  Weight: 292 lb (132.45 kg)    Obese male, comfortable at rest.  HEENT: Conjunctiva and lids normal, oropharynx clear with moist mucosa.  Neck: Supple, increased girth, no carotid bruits, no thyromegaly.  Lungs: Clear to auscultation, nonlabored breathing at rest.  Cardiac: Regular rate and rhythm, no S3, soft apical systolic murmur, no pericardial rub.  Abdomen: Soft, nontender, protuberant, bowel sounds present, no guarding or rebound.  Extremities: Chronic appearing firm edema, right worse than left with stasis, distal pulses 1-2+.  Skin: Warm and dry. Scattered tattoos.  Musculoskeletal: No kyphosis.  Neuropsychiatric: Alert and oriented x3, affect grossly appropriate.   Problem List and Plan   Coronary atherosclerosis of native coronary artery Multivessel disease status  post CABG in 2006. Symptomatically stable at this point. He continues on aspirin and statin.  Pulmonary hypertension Mixed picture, pulmonary arterial greater than pulmonary venous based on workup so far. He continues on BiPAP at nighttime for treatment of OSA and hypoventilation syndrome. His weight is down reflecting diuresis most likely. Followup echocardiogram shows normal LVEF, evidence of cor pulmonale with RV enlargement but PASP down to 33 mm mercury. He maintains follow up in the pulmonary clinic.  Essential  hypertension, benign Blood pressure elevated today. He reports follow up with Dr. Leandrew Koyanagi. May need to consider additional treatment such as ACE inhibitor or ARB particularly with diabetes.    Jonelle Sidle, M.D., F.A.C.C.

## 2013-07-23 NOTE — Assessment & Plan Note (Addendum)
Mixed picture, pulmonary arterial greater than pulmonary venous based on workup so far. He continues on BiPAP at nighttime for treatment of OSA and hypoventilation syndrome. His weight is down reflecting diuresis most likely. Followup echocardiogram shows normal LVEF, evidence of cor pulmonale with RV enlargement but PASP down to 33 mm mercury. He maintains follow up in the pulmonary clinic.

## 2013-07-23 NOTE — Assessment & Plan Note (Signed)
Multivessel disease status post CABG in 2006. Symptomatically stable at this point. He continues on aspirin and statin.

## 2013-07-23 NOTE — Assessment & Plan Note (Signed)
Blood pressure elevated today. He reports follow up with Dr. Leandrew Koyanagi. May need to consider additional treatment such as ACE inhibitor or ARB particularly with diabetes.

## 2013-08-29 ENCOUNTER — Encounter: Payer: Self-pay | Admitting: Adult Health

## 2013-08-29 ENCOUNTER — Ambulatory Visit: Payer: Self-pay | Admitting: Adult Health

## 2013-08-29 VITALS — BP 134/82 | HR 84 | Temp 98.3°F | Ht 69.0 in | Wt 289.0 lb

## 2013-08-29 DIAGNOSIS — I2781 Cor pulmonale (chronic): Secondary | ICD-10-CM

## 2013-08-29 DIAGNOSIS — G4733 Obstructive sleep apnea (adult) (pediatric): Secondary | ICD-10-CM

## 2013-08-29 NOTE — Assessment & Plan Note (Signed)
Continue on BIPAP At bedtime  , try to wear for at least 4-6hr every night.  Work on Weight loss.  Low salt diet  Keep legs elevated  Do not drive if sleepy.  Please contact office for sooner follow up if symptoms do not improve or worsen or seek emergency care  follow up Dr. Vassie LollAlva  In 4 months  and As needed

## 2013-08-29 NOTE — Assessment & Plan Note (Signed)
Continue on current regimen .   

## 2013-08-29 NOTE — Patient Instructions (Signed)
Continue on BIPAP At bedtime  , try to wear for at least 4-6hr every night.  Work on Weight loss.  Low salt diet  Keep legs elevated  Do not drive if sleepy.  Please contact office for sooner follow up if symptoms do not improve or worsen or seek emergency care  follow up Dr. Alva  In 4 months  and As needed    

## 2013-08-29 NOTE — Progress Notes (Signed)
Subjective:    Patient ID: Cesar Cantrell, male    DOB: April 26, 1966, 48 y.o.   MRN: [id number]  HPI   PCP -Griselda Miner   48 year old obese smoker, with OSA/OHS, severe pulm htn & cor pulmonale Adm 6/13-6/25/14 directly from PCP's office marked bilateral lower extremity edema , cor pulmonale and right heart failure.  He developed acute hypercarbic respiratory failure, with ABG pH 7.21, pCO2 98, pO2 63 and bicarb 39, on 5L. 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 Charlett Lango. Initial ejection fraction 39% by cardiac catheterization, but subsequent normalization, as assessed by most recent  echocardiogram (EF 55% to 60%), in 2011.   6/13 Echo: LVEF 55-60%, mild LA enlargement, mod RA enlargement, mild to mod RV dilatation, estimated RVSP 62 mmHg  6/16 RHC: RV: 87/9 mm Hg, PA 84/81 mm Hg with mean 57 mm Hg, PCWP 25 mm Hg. Fick Cardiac Output: 5.99 L/minute ,2.43 L/min/M2, severe pulmonary hypertension  6/19 BLE Doppler: Negative for DVTs bilaterally   He was discharged on bipap nocturnal with 4L O2  He diuresed well, compliant with bipap & O2 since dc , has come off O2 daytime (by himself) & has quit smoking   PSG on July 2014  confirmed his underlying severe obstructive sleep apnea with associated obesity hypoventilation syndrome with an AHI of 63. He did require supplemental oxygen with his BiPAP . Due to persistent desaturations on BiPAP.  He was set at 13/9 with 2 L of oxygen  Patient reports that he is doing very well on BiPAP. Every night for average of 6 hours plus. He was again recently hospitalized last week with her right lower leg cellulitis. Venous Dopplers were negative for  DVT.  04/30/13  Pt reports his breathing has uchanged. Has very little cough w/ occasional clear phlem. Denies any wheezing and no chest tx. He uses his rescue inhaler about once a week but that just depends if he has over done himself.  Pt wears the BIPAP w/ 3 liters O2 everynight x 6-7 hrs a night. denies any problems w/ mahcine/mask. He feels rested most days. He still has some days he does not feel like doing anything bc he felt like he didn't sleep at all.  Has quit smoking 01/2013 Wt unchanged Spirometry Fev1 60%, ratio 82, no BD response Continues to desatn on walking -2nd lap, recovers quickly >>no changes   08/29/2013 Follow up  Returns for follow up OSA/OHS, severe pulm htn & cor pulmonale. No smoking since 01/2013 .  Pt wears cpap 3 nights per week, 6-7 hours a night.  Pt has no complaints with mask or supplies.  No other complaints at this time.  We discussed need to wear every night.  No flare in LE edema.  Breathing is at baseline with no flare in cough or wheezing.  No ER or hospital admits since last ov.  No chest pain, orthopnea, edema or fever.    Review of Systems  neg for any significant sore throat, dysphagia, itching, sneezing, nasal congestion or excess/ purulent secretions, fever, chills, sweats, unintended wt loss, pleuritic or exertional cp, hempoptysis, orthopnea pnd or change in chronic leg swelling. Also denies presyncope, palpitations, heartburn, abdominal pain,  nausea, vomiting, diarrhea or change in bowel or urinary habits, dysuria,hematuria, rash, arthralgias, visual complaints, headache, numbness weakness or ataxia.      Objective:   Physical Exam   Gen. Pleasant, obese, in no distress ENT - no lesions, no post nasal drip Neck: No JVD, no thyromegaly, no carotid bruits Lungs: no use of accessory muscles, no dullness to percussion, decreased without rales or rhonchi  Cardiovascular: Rhythm regular, heart sounds  normal, no murmurs or gallops, no  peripheral edema Musculoskeletal: No deformities, no cyanosis or clubbing , no tremors       Assessment & Plan:

## 2014-01-05 ENCOUNTER — Encounter: Payer: Self-pay | Admitting: Pulmonary Disease

## 2014-01-05 ENCOUNTER — Ambulatory Visit (INDEPENDENT_AMBULATORY_CARE_PROVIDER_SITE_OTHER): Payer: Medicaid - Out of State | Admitting: Pulmonary Disease

## 2014-01-05 VITALS — BP 114/72 | HR 84 | Temp 98.1°F | Ht 69.0 in | Wt 288.4 lb

## 2014-01-05 DIAGNOSIS — G4733 Obstructive sleep apnea (adult) (pediatric): Secondary | ICD-10-CM

## 2014-01-05 DIAGNOSIS — I279 Pulmonary heart disease, unspecified: Secondary | ICD-10-CM

## 2014-01-05 DIAGNOSIS — I2781 Cor pulmonale (chronic): Secondary | ICD-10-CM

## 2014-01-05 NOTE — Patient Instructions (Signed)
Ambulatory satn We will check download on your CPAP If that is OK, we will check oxygen level during sleep

## 2014-01-05 NOTE — Progress Notes (Signed)
   Subjective:    Patient ID: Cesar Cantrell, male    DOB: 1965/10/28, 48 y.o.   MRN: [id number]  HPI  PCP -Griselda Miner   48 year old obese smoker, with OSA/OHS, severe pulm htn & cor pulmonale  Adm 6/13-6/25/14 directly from PCP's office marked bilateral lower extremity edema , cor pulmonale and right heart failure.  He developed acute hypercarbic respiratory failure, with ABG pH 7.21, pCO2 98, pO2 63 and bicarb 39, on 5L.  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 Charlett Lango. Initial ejection fraction 39% by cardiac catheterization, but subsequent normalization on rpt echo  01/2013 Echo: LVEF 55-60%, mild LA enlargement, mod RA enlargement, mild to mod RV dilatation, estimated RVSP 62 mmHg  01/20/13 RHC: RV: 87/9 mm Hg, PA 84/81 mm Hg with mean 57 mm Hg, PCWP 25 mm Hg. Fick Cardiac Output: 5.99 L/minute ,2.43 L/min/M2, severe pulmonary hypertension  6/19 BLE Doppler: Negative for DVTs bilaterally  He was discharged on bipap nocturnal with 4L O2  He diuresed well, compliant with bipap & O2 since dc , has come off O2 daytime (by himself) & has quit smoking  PSG on July 2014 confirmed his underlying severe obstructive sleep apnea with associated obesity hypoventilation syndrome with an AHI of 63. He did require supplemental oxygen due to persistent desaturations on BiPAP.  He was set at 13/9 with 2 L of oxygen    04/30/13 Spirometry Fev1 60%, ratio 82, no BD response      01/05/2014  Chief Complaint  Patient presents with  . Follow-up    Uses CPAP when he feels like he needs it about twice a week usually. Breathing unchanged. Some days worse than others. No wheezing, occas chest tx.       Uses O2 prn- twice a week  No smoking since 01/2013 .  Pt wears cpap 3 nights per week, 6-7 hours a night. Pt has no complaints with mask or supplies. No other complaints at this time.  No flare in LE edema.  Breathing is at baseline with no flare in cough or wheezing.  No ER or hospital admits since last ov.  No chest pain, orthopnea, edema or fever.  Continues to desatn on walking -2nd lap, recovers quickly  Review of Systems neg for any significant sore throat, dysphagia, itching, sneezing, nasal congestion or excess/ purulent secretions, fever, chills, sweats, unintended wt loss, pleuritic or exertional cp, hempoptysis, orthopnea pnd or change in chronic leg swelling. Also denies presyncope, palpitations, heartburn, abdominal pain, nausea, vomiting, diarrhea or change in bowel or urinary habits, dysuria,hematuria, rash, arthralgias, visual complaints, headache, numbness weakness or ataxia.     Objective:   Physical Exam  Gen. Pleasant, obese, in no distress ENT - no lesions, no post nasal drip Neck: No JVD, no thyromegaly, no carotid bruits Lungs: no use of accessory muscles, no dullness to percussion, decreased without rales or rhonchi  Cardiovascular: Rhythm regular, heart sounds  normal, no murmurs or gallops, no peripheral edema Musculoskeletal: No deformities, no cyanosis or clubbing , no tremors       Assessment & Plan:

## 2014-01-07 ENCOUNTER — Encounter: Payer: Self-pay | Admitting: Cardiology

## 2014-01-07 NOTE — Progress Notes (Signed)
Clinical Summary Cesar Cantrell is a 48 y.o.male last seen in December 2014. Recent followup with the pulmonary clinic noted. He reports no change in cardiac status, specifically no angina symptoms, no progressive shortness of breath, no hospitalizations. He tells me he is using his BIPAP only twice a week. We discussed compliance, particularly in light of known pulmonary hypertension.  Most recent echocardiogram in September 2014 showed mild LVH with LVEF 55-60%, grade 2 diastolic dysfunction, trivial mitral regurgitation, mild left atrial enlargement, moderately dilated right ventricle with mildly reduced contraction, trivial tricuspid regurgitation, PASP 33 mm mercury. This was a decrease in pulmonary pressure.  He continues on Lipitor, lipids have been followed by Dr. Leandrew Koyanagi.   No Known Allergies  Current Outpatient Prescriptions  Medication Sig Dispense Refill  . albuterol (PROVENTIL HFA;VENTOLIN HFA) 108 (90 BASE) MCG/ACT inhaler Inhale 2 puffs into the lungs every 6 (six) hours as needed for wheezing.  1 Inhaler  2  . aspirin EC 81 MG tablet Take 81 mg by mouth every morning.      Marland Kitchen atorvastatin (LIPITOR) 40 MG tablet Take 1 tablet by mouth daily.      Marland Kitchen glimepiride (AMARYL) 4 MG tablet Take 4 mg by mouth 2 (two) times daily with a meal.      . metFORMIN (GLUCOPHAGE) 1000 MG tablet Take 1 tablet by mouth 2 (two) times daily.      . Omega-3 Fatty Acids (FISH OIL) 1200 MG CAPS Take 1 capsule by mouth daily.       Marland Kitchen torsemide (DEMADEX) 20 MG tablet TAKE ONE TABLET BY MOUTH TWICE DAILY  60 tablet  2   No current facility-administered medications for this visit.    Past Medical History  Diagnosis Date  . Coronary atherosclerosis of native coronary artery     Multivessel status post CABG  . Essential hypertension, benign   . Pneumonia   . Pulmonary hypertension     PASP 80 mmHg at [facility] 01/2013  . Type 2 diabetes mellitus   . Sleep apnea     On BIPAP  . Cor pulmonale   . Obesity  hypoventilation syndrome     Past Surgical History  Procedure Laterality Date  . Coronary artery bypass graft  09-20-04    LIMA to LAD, left radial to D1, SVG to ramus and SVG to PDA/PLA    Social History Cesar Cantrell reports that he quit smoking about a year ago. His smoking use included Cigarettes. He has a 45 pack-year smoking history. His smokeless tobacco use includes Chew. Cesar Cantrell reports that he does not drink alcohol.  Review of Systems No palpitations, dizziness, syncope. Otherwise negative.  Physical Examination Filed Vitals:   01/08/14 0906  BP: 134/86  Pulse: 82   Filed Weights   01/08/14 0906  Weight: 287 lb (130.182 kg)    Obese male, comfortable at rest.  HEENT: Conjunctiva and lids normal, oropharynx clear with moist mucosa.  Neck: Supple, increased girth, no carotid bruits, no thyromegaly.  Lungs: Clear to auscultation, nonlabored breathing at rest.  Cardiac: Regular rate and rhythm, no S3, soft apical systolic murmur, no pericardial rub.  Abdomen: Soft, nontender, protuberant, bowel sounds present, no guarding or rebound.  Extremities: Decreased edema compared to last visit, distal pulses 1-2+.  Skin: Warm and dry. Scattered tattoos.  Musculoskeletal: No kyphosis.  Neuropsychiatric: Alert and oriented x3, affect grossly appropriate.    Problem List and Plan   Coronary atherosclerosis of native coronary artery No active angina  symptoms, continue medical therapy and observation.  Pulmonary hypertension Mixed picture, pulmonary arterial greater than pulmonary venous based on prior workup so far. He continues on BiPAP at nighttime for treatment of OSA and hypoventilation syndrome, although states he has only been doing this twice a week. Discussed compliance and he should keep regular visits with Pulmonary. Most recent echocardiogram showed improvement in pulmonary pressures with PASP down to 33 mm mercury.  Mixed hyperlipidemia Continues on Lipitor, keep  followup with Dr. Leandrew KoyanagiBurdine.  Essential hypertension, benign No change to current regimen.    Jonelle SidleSamuel G. Franciso Dierks, M.D., F.A.C.C.

## 2014-01-08 ENCOUNTER — Ambulatory Visit (INDEPENDENT_AMBULATORY_CARE_PROVIDER_SITE_OTHER): Payer: Medicaid - Out of State | Admitting: Cardiology

## 2014-01-08 ENCOUNTER — Encounter: Payer: Self-pay | Admitting: Cardiology

## 2014-01-08 VITALS — BP 134/86 | HR 82 | Ht 69.0 in | Wt 287.0 lb

## 2014-01-08 DIAGNOSIS — I2510 Atherosclerotic heart disease of native coronary artery without angina pectoris: Secondary | ICD-10-CM

## 2014-01-08 DIAGNOSIS — E782 Mixed hyperlipidemia: Secondary | ICD-10-CM

## 2014-01-08 DIAGNOSIS — I10 Essential (primary) hypertension: Secondary | ICD-10-CM

## 2014-01-08 DIAGNOSIS — I272 Pulmonary hypertension, unspecified: Secondary | ICD-10-CM

## 2014-01-08 DIAGNOSIS — I2789 Other specified pulmonary heart diseases: Secondary | ICD-10-CM

## 2014-01-08 NOTE — Assessment & Plan Note (Signed)
No change to current regimen. 

## 2014-01-08 NOTE — Patient Instructions (Signed)

## 2014-01-08 NOTE — Assessment & Plan Note (Signed)
No active angina symptoms, continue medical therapy and observation.

## 2014-01-08 NOTE — Assessment & Plan Note (Signed)
Mixed picture, pulmonary arterial greater than pulmonary venous based on prior workup so far. He continues on BiPAP at nighttime for treatment of OSA and hypoventilation syndrome, although states he has only been doing this twice a week. Discussed compliance and he should keep regular visits with Pulmonary. Most recent echocardiogram showed improvement in pulmonary pressures with PASP down to 33 mm mercury.

## 2014-01-08 NOTE — Assessment & Plan Note (Signed)
Continues on Lipitor, keep followup with Dr. Leandrew Koyanagi.

## 2014-01-12 NOTE — Assessment & Plan Note (Signed)
Weight loss encouraged, compliance with goal of at least 4-6 hrs every night is the expectation. Advised against medications with sedative side effects Cautioned against driving when sleepy - understanding that sleepiness will vary on a day to day basis  

## 2014-01-12 NOTE — Assessment & Plan Note (Signed)
Ct lasix O2

## 2014-05-18 IMAGING — CR DG CHEST 1V PORT
1 series · 1 of 1 positions shown · non-contrast
Comparison: 01/18/2013

CLINICAL DATA: Respiratory failure.

PORTABLE CHEST - 1 VIEW

[AP]
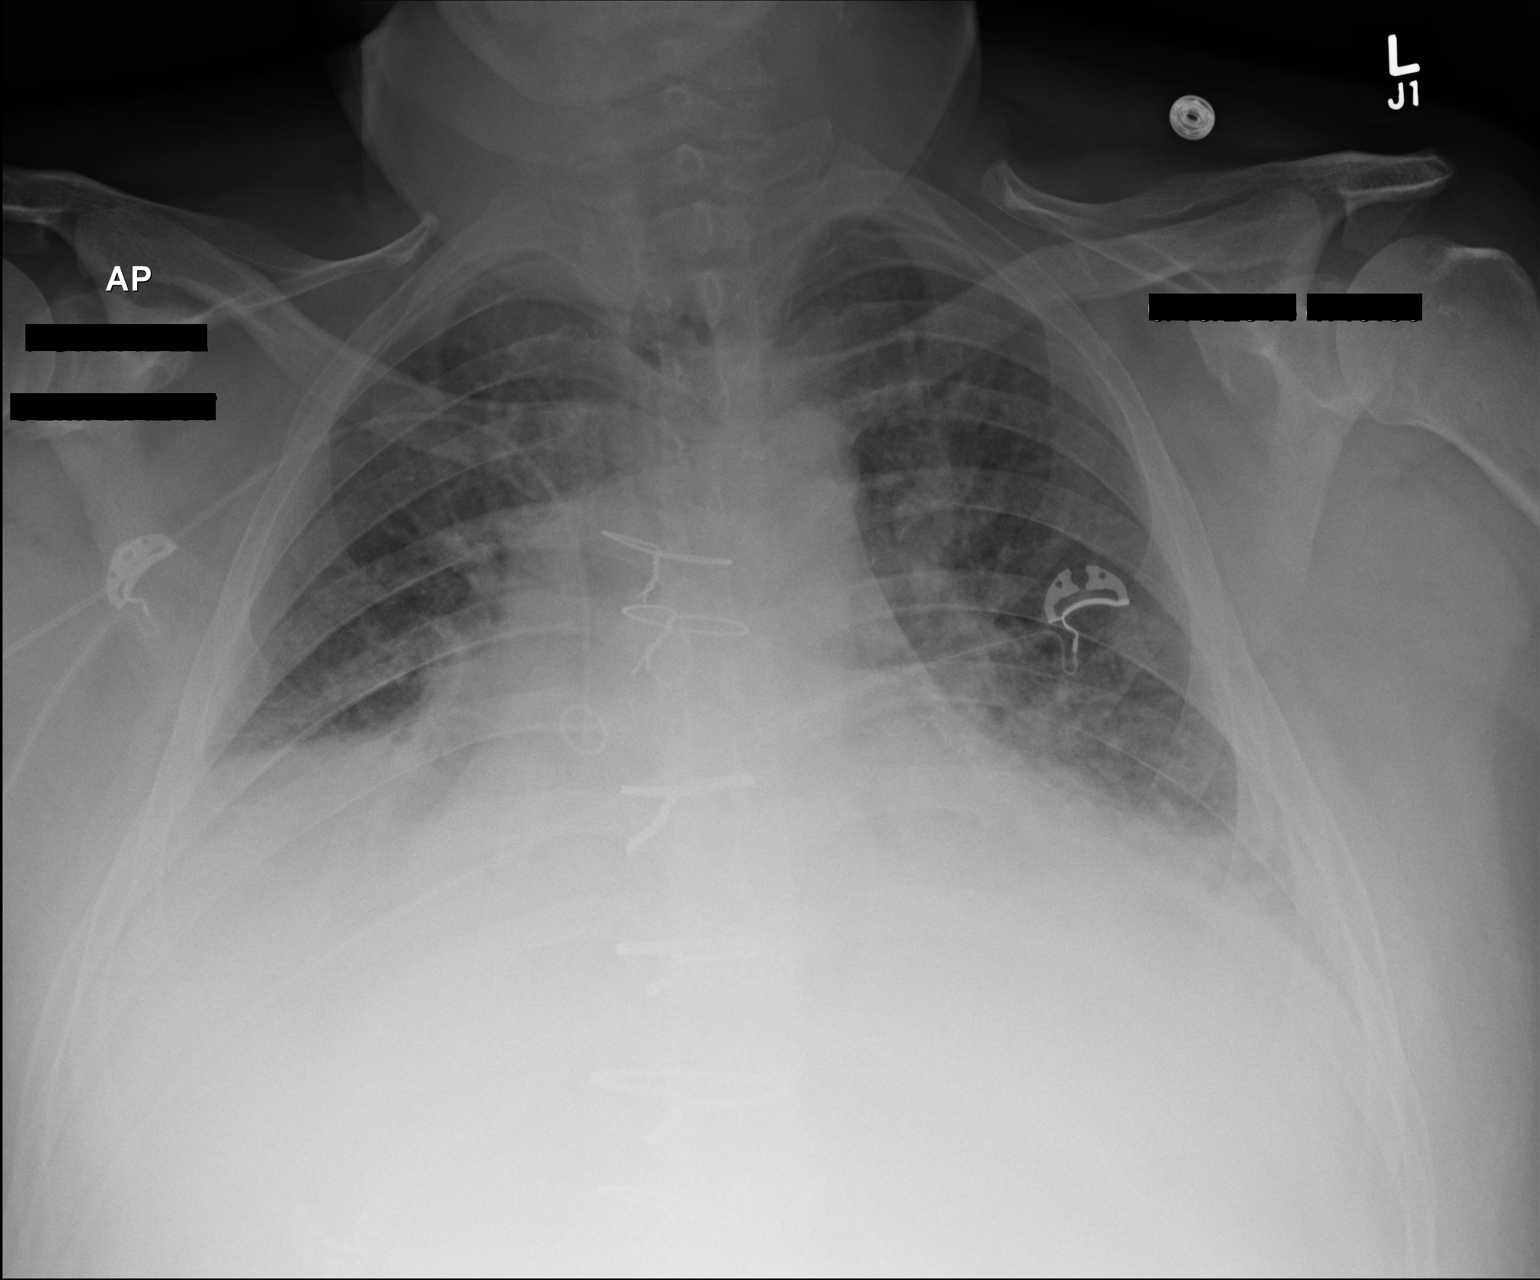

[1 of 1 positions shown; findings below may reference images not displayed]

FINDINGS: The endotracheal tube has been removed.  There is
interval placement of a right upper extremity PICC line with the
tip located in the lower SVC.  Lungs show persistent
atelectasis/infiltrates in both lower lung zones.  No significant
pleural effusions or pulmonary edema are identified.  Heart size is
stable.
IMPRESSION: Atelectasis/infiltrates in both lower lung zones post extubation.

## 2014-06-08 ENCOUNTER — Ambulatory Visit: Payer: Self-pay | Admitting: Adult Health

## 2014-06-16 ENCOUNTER — Ambulatory Visit (INDEPENDENT_AMBULATORY_CARE_PROVIDER_SITE_OTHER): Payer: Medicaid - Out of State | Admitting: Adult Health

## 2014-06-16 ENCOUNTER — Encounter: Payer: Self-pay | Admitting: Adult Health

## 2014-06-16 VITALS — BP 138/88 | HR 85 | Temp 97.1°F | Ht 69.0 in | Wt 296.0 lb

## 2014-06-16 DIAGNOSIS — G4733 Obstructive sleep apnea (adult) (pediatric): Secondary | ICD-10-CM

## 2014-06-16 NOTE — Patient Instructions (Signed)
Continue on BIPAP At bedtime  , try to wear for at least 4-6hr every night.  Work on Weight loss.  Download requested.  Do not drive if sleepy.  Please contact office for sooner follow up if symptoms do not improve or worsen or seek emergency care  follow up Dr. Vassie LollAlva  In 6 months  and As needed

## 2014-06-18 NOTE — Assessment & Plan Note (Signed)
Continue on BIPAP At bedtime  , try to wear for at least 4-6hr every night.  Work on Weight loss.  Download requested.  Do not drive if sleepy.  Please contact office for sooner follow up if symptoms do not improve or worsen or seek emergency care  follow up Dr. Alva  In 6 months  and As needed   

## 2014-06-18 NOTE — Progress Notes (Signed)
Subjective:    Patient ID: Cesar Cantrell, male    DOB: 1965-10-06, 48 y.o.   MRN: [id number]  HPI  PCP -Griselda MinerBurdine, [place]   48 year old obese smoker, with OSA/OHS, severe pulm htn & cor pulmonale  Adm 6/13-6/25/14 directly from PCP's office marked bilateral lower extremity edema , cor pulmonale and right heart failure.  He developed acute hypercarbic respiratory failure, with ABG pH 7.21, pCO2 98, pO2 63 and bicarb 39, on 5L.  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 Charlett LangoSteven Hendrickson. Initial ejection fraction 39% by cardiac catheterization, but subsequent normalization on rpt echo  01/2013 Echo: LVEF 55-60%, mild LA enlargement, mod RA enlargement, mild to mod RV dilatation, estimated RVSP 62 mmHg  01/20/13 RHC: RV: 87/9 mm Hg, PA 84/81 mm Hg with mean 57 mm Hg, PCWP 25 mm Hg. Fick Cardiac Output: 5.99 L/minute ,2.43 L/min/M2, severe pulmonary hypertension  6/19 BLE Doppler: Negative for DVTs bilaterally  He was discharged on bipap nocturnal with 4L O2  He diuresed well, compliant with bipap & O2 since dc , has come off O2 daytime (by himself) & has quit smoking  PSG on July 2014 confirmed his underlying severe obstructive sleep apnea with associated obesity hypoventilation syndrome with an AHI of 63. He did require supplemental oxygen due to persistent desaturations on BiPAP.  He was set at 13/9 with 2 L of oxygen    04/30/13 Spirometry Fev1 60%, ratio 82, no BD response      01/05/2014  Chief Complaint  Patient presents with  . Follow-up    Uses CPAP when he feels like he needs it about twice a week usually. Breathing unchanged. Some days worse than others. No wheezing, occas chest tx.       Uses O2 prn- twice a week  No smoking since 01/2013 .  Pt wears cpap 3 nights per week, 6-7 hours a night. Pt has no complaints with mask or supplies. No other complaints at this time.  No flare in LE edema.  Breathing is at baseline with no flare in cough or wheezing.  No ER or hospital admits since last ov.  No chest pain, orthopnea, edema or fever.  Continues to desatn on walking -2nd lap, recovers quickly   06/16/14 Follow up  OSA/OHS, severe pulm htn & cor pulmonale  Returns for follow up . Reports sleep is doing well.   Wearing BiPAP w/ O2 x4nights x4hrs.  no new complaints No flare in LE edema.  Breathing is at baseline with no flare in cough or wheezing.  No ER or hospital admits since last ov.  No chest pain, orthopnea, edema or fever.    Review of Systems  Constitutional:   No  weight loss, night sweats,  Fevers, chills, +fatigue, or  lassitude.  HEENT:   No headaches,  Difficulty swallowing,  Tooth/dental problems, or  Sore throat,                No sneezing, itching, ear ache, nasal congestion, post nasal drip,   CV:  No chest pain,  Orthopnea, PND, swelling in lower extremities, anasarca, dizziness, palpitations, syncope.   GI  No heartburn, indigestion, abdominal pain, nausea, vomiting, diarrhea, change  in bowel habits, loss of appetite, bloody stools.   Resp:   No change in color of mucus.  No wheezing.  No chest wall deformity  Skin: no rash or lesions.  GU: no dysuria, change in color of urine, no urgency or frequency.  No flank pain, no hematuria   MS:  No joint pain or swelling.  No decreased range of motion.  No back pain.  Psych:  No change in mood or affect. No depression or anxiety.  No memory loss.          Objective:   Physical Exam GEN: A/Ox3; pleasant , NAD, obese   HEENT:  [place]/AT,  EACs-clear, TMs-wnl, NOSE-clear, THROAT-clear, no lesions, no postnasal drip or exudate noted.   NECK:  Supple w/ fair ROM; no JVD; normal carotid  impulses w/o bruits; no thyromegaly or nodules palpated; no lymphadenopathy.  RESP  Clear  P & A; w/o, wheezes/ rales/ or rhonchi.no accessory muscle use, no dullness to percussion  CARD:  RRR, no m/r/g  , tr  peripheral edema, pulses intact, no cyanosis or clubbing.  GI:   Soft & nt; nml bowel sounds; no organomegaly or masses detected.  Musco: Warm bil, no deformities or joint swelling noted.   Neuro: alert, no focal deficits noted.    Skin: Warm, no lesions or rashes         Assessment & Plan:

## 2014-06-25 IMAGING — CR DG CHEST 2V
2 series · 2 of 2 positions shown · non-contrast
Comparison: 01/26/2013; 01/22/2013; 09/22/2004; 09/13/2004

CLINICAL DATA: Leg swelling, diabetes, hypertension, history of
CABG

CHEST - 2 VIEW

[w chest pa]
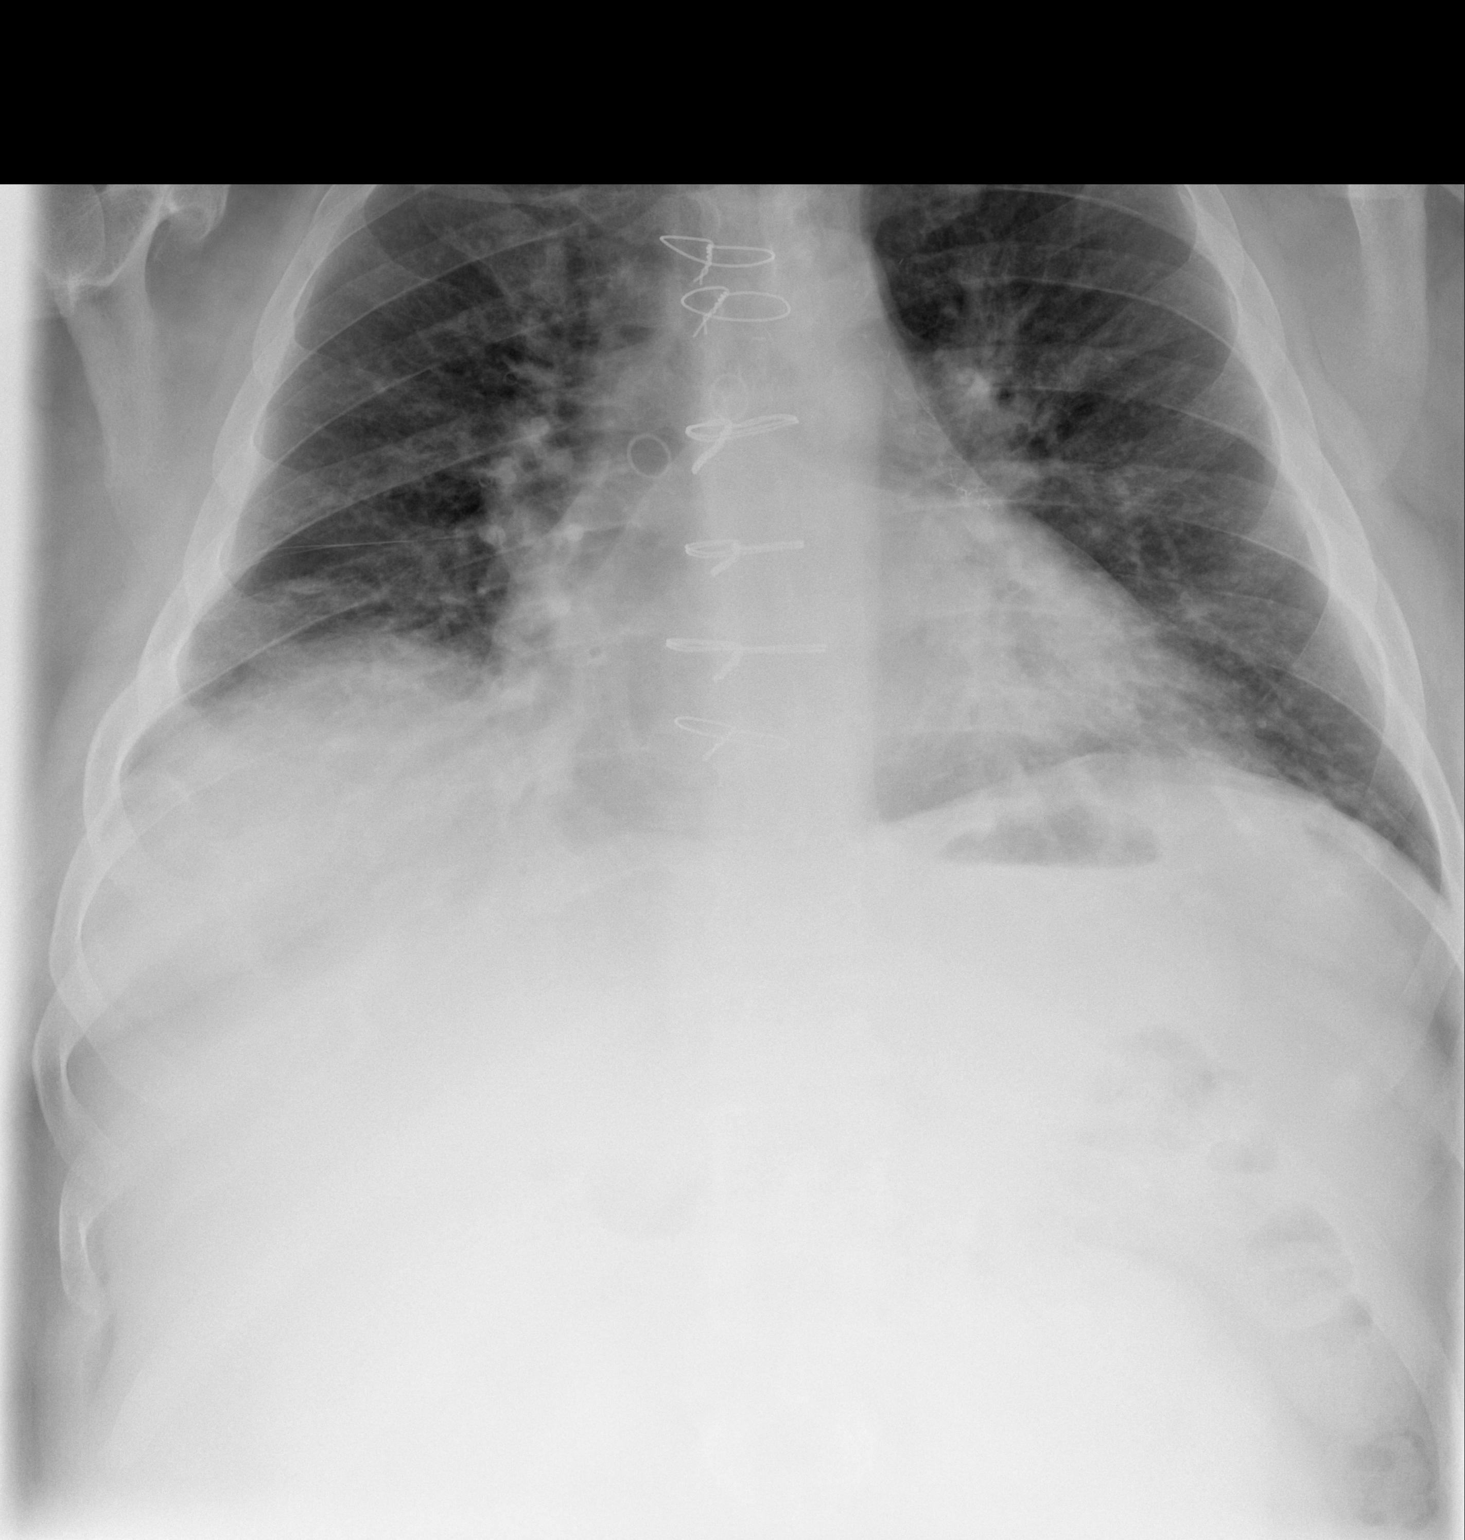

[w chest lat]
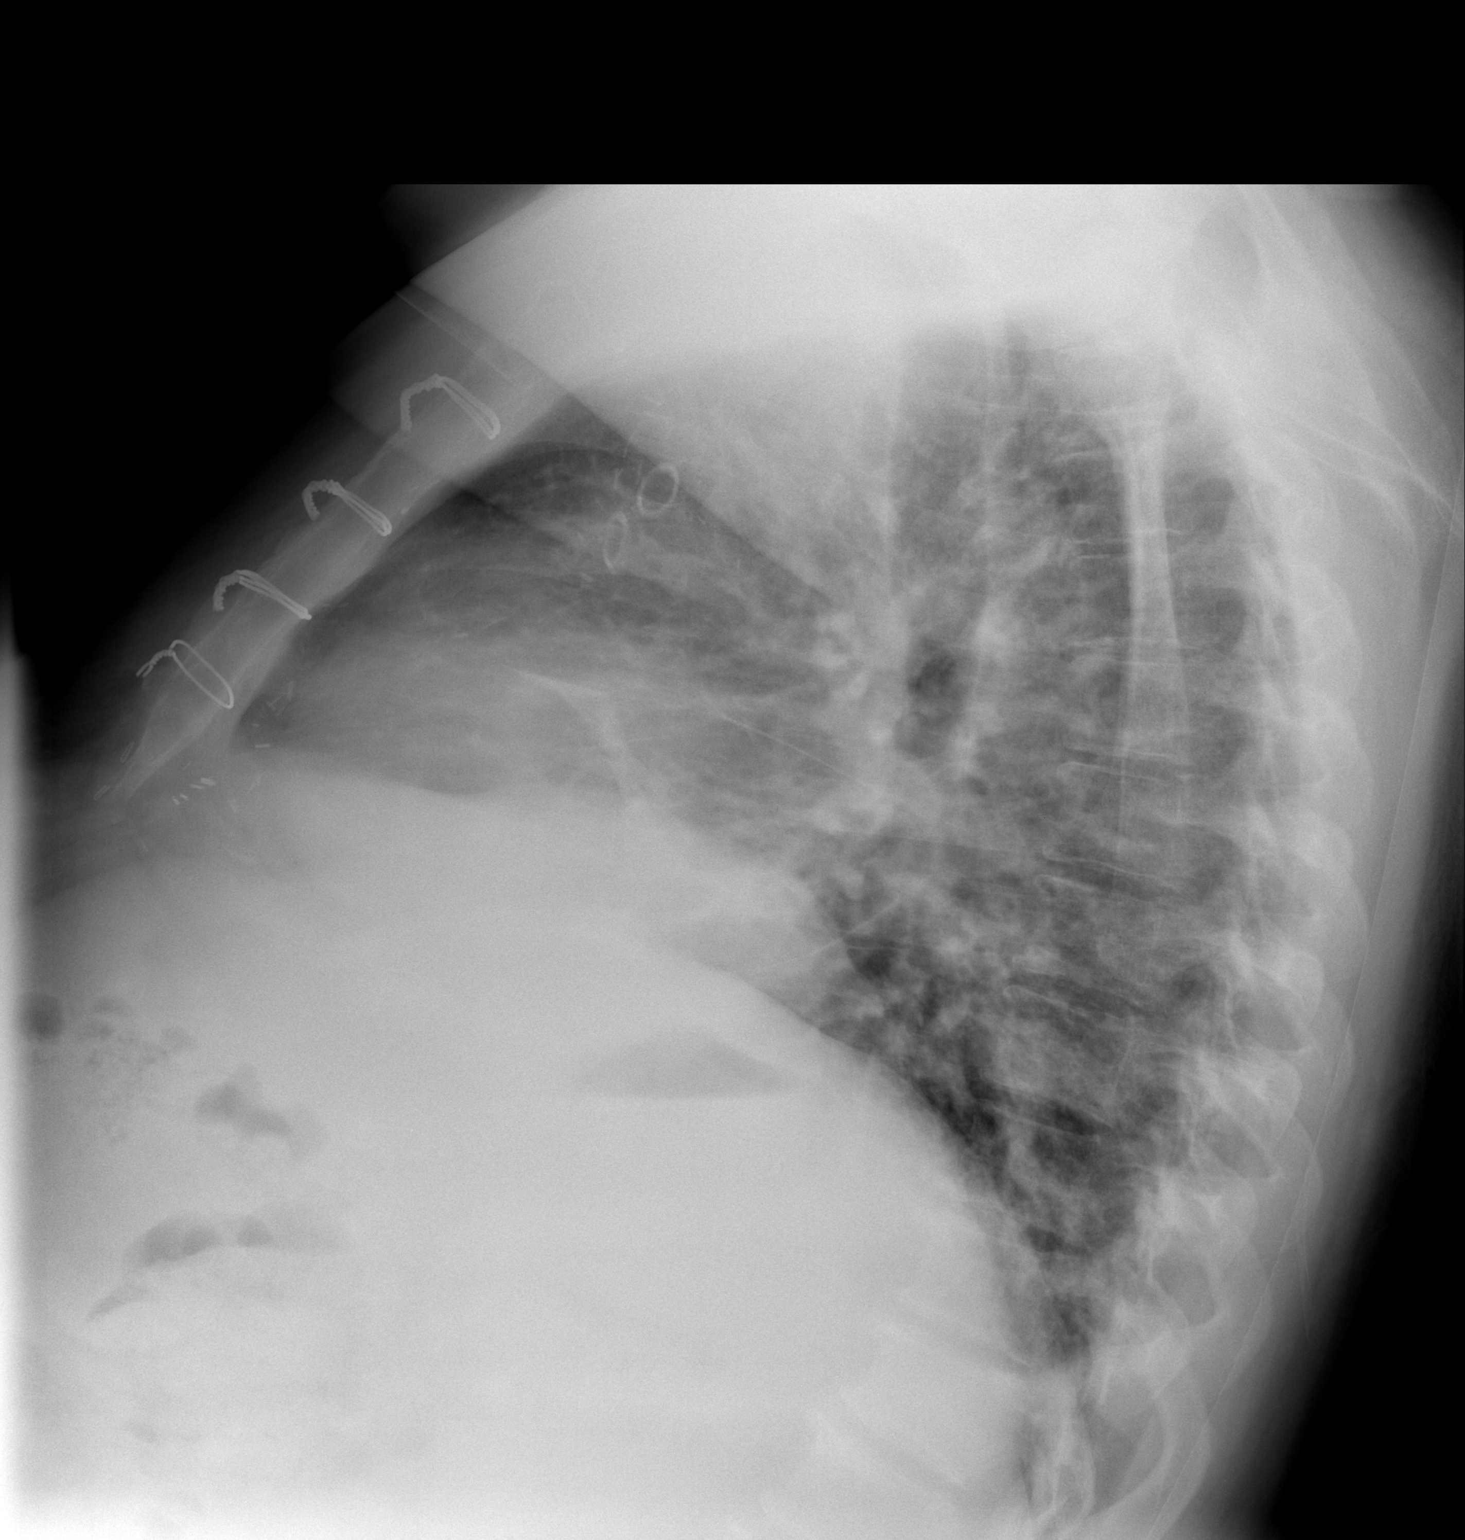

[2 of 2 positions shown; findings below may reference images not displayed]

FINDINGS: Grossly unchanged enlarged cardiac silhouette and
mediastinal contours post median sternotomy and CABG. There are
chronic findings of pulmonary venous congestion without definite
evidence of superimposed pulmonary edema.  Minimal improved
aeration of the bilateral lung bases with persistent bibasilar
opacities, right greater than left.  No definite pleural effusion
or pneumothorax.  Grossly unchanged bones.
IMPRESSION: 1.  Findings of chronic pulmonary venous congestion without
definite superimposed pulmonary edema.
2.  Improved aeration of the lungs with persistent bibasilar
opacities, right greater than left, likely atelectasis.

## 2014-07-16 ENCOUNTER — Encounter: Payer: Self-pay | Admitting: Cardiology

## 2014-07-16 ENCOUNTER — Ambulatory Visit (INDEPENDENT_AMBULATORY_CARE_PROVIDER_SITE_OTHER): Payer: Medicaid - Out of State | Admitting: Cardiology

## 2014-07-16 VITALS — BP 132/76 | HR 81 | Ht 69.0 in | Wt 294.0 lb

## 2014-07-16 DIAGNOSIS — I272 Pulmonary hypertension, unspecified: Secondary | ICD-10-CM

## 2014-07-16 DIAGNOSIS — I2510 Atherosclerotic heart disease of native coronary artery without angina pectoris: Secondary | ICD-10-CM

## 2014-07-16 DIAGNOSIS — I270 Primary pulmonary hypertension: Secondary | ICD-10-CM

## 2014-07-16 DIAGNOSIS — E782 Mixed hyperlipidemia: Secondary | ICD-10-CM

## 2014-07-16 DIAGNOSIS — I2781 Cor pulmonale (chronic): Secondary | ICD-10-CM

## 2014-07-16 NOTE — Assessment & Plan Note (Signed)
Symptomatically stable on medical therapy. He continues on aspirin and statin. Due for follow-up physical with lab work per Dr. Leandrew KoyanagiBurdine early next year. We will keep a 6 month follow-up pattern. Can discuss follow-up ischemic testing within the next year.

## 2014-07-16 NOTE — Patient Instructions (Signed)

## 2014-07-16 NOTE — Assessment & Plan Note (Signed)
Mildly reduced RV contraction. Mixed picture, pulmonary arterial greater than pulmonary venous based on prior workup, although PASP down to 33 mmHg based on most recent echocardiogram. He has not been compliant with regular dose of BiPAP, I reinforced the importance of this with him today. Keep follow-up in the Pulmonary clinic. He continues on Demadex.

## 2014-07-16 NOTE — Assessment & Plan Note (Signed)
Continues on Lipitor. Keep follow-up with Dr. Leandrew KoyanagiBurdine for FLP and LFTs.

## 2014-07-16 NOTE — Progress Notes (Signed)
Reason for visit: CAD, pulmonary hypertension  Clinical Summary [name] is a 48 y.o.male last seen in June. Recent interval follow-up in the Pulmonary clinic noted in November. He continues on BiPAP, although tells me that he usually only uses it one or 2 days a week. He has not been compliant with regular treatment, and we did discuss the fact that this negatively affects his pulmonary hypertension.  He reports no active angina symptoms or nitroglycerin use, states that he has been compliant with his medications including aspirin, statin, and Demadex. GXT was negative for ischemia in March 2011 (Dr. Riley Cantrell). ECG today shows sinus rhythm with IVCD and old inferior infarct pattern, no significant change appeared to prior tracing.  Most recent echocardiogram in September 2014 showed mild LVH with LVEF 55-60%, grade 2 diastolic dysfunction, trivial mitral regurgitation, mild left atrial enlargement, moderately dilated right ventricle with mildly reduced contraction, trivial tricuspid regurgitation, PASP 33 mm mercury. This was a decrease in pulmonary pressure.   No Known Allergies  Current Outpatient Prescriptions  Medication Sig Dispense Refill  . albuterol (PROVENTIL HFA;VENTOLIN HFA) 108 (90 BASE) MCG/ACT inhaler Inhale 2 puffs into the lungs every 6 (six) hours as needed for wheezing. 1 Inhaler 2  . aspirin EC 81 MG tablet Take 81 mg by mouth every morning.    Marland Kitchen. atorvastatin (LIPITOR) 40 MG tablet Take 1 tablet by mouth daily.    Marland Kitchen. glimepiride (AMARYL) 4 MG tablet Take 4 mg by mouth 2 (two) times daily with a meal.    . insulin glargine (LANTUS) 100 UNIT/ML injection Inject 60 Units into the skin daily.    . metFORMIN (GLUCOPHAGE) 1000 MG tablet Take 1 tablet by mouth 2 (two) times daily.    . Omega-3 Fatty Acids (FISH OIL) 1200 MG CAPS Take 1 capsule by mouth daily.     Marland Kitchen. torsemide (DEMADEX) 20 MG tablet TAKE ONE TABLET BY MOUTH TWICE DAILY 60 tablet 2   No current  facility-administered medications for this visit.    Past Medical History  Diagnosis Date  . Coronary atherosclerosis of native coronary artery     Multivessel status post CABG  . Essential hypertension, benign   . Pneumonia   . Pulmonary hypertension     PASP [facility] mmHg [facility] 01/2013  . Type 2 diabetes mellitus   . Sleep apnea     On BIPAP  . Cor pulmonale   . Obesity hypoventilation syndrome     Past Surgical History  Procedure Laterality Date  . Coronary artery bypass graft  09-20-04    LIMA to LAD, left radial to D1, SVG to ramus and SVG to PDA/PLA    Social History [name] reports that he quit smoking about 17 months ago. His smoking use included Cigarettes. He started smoking about 32 years ago. He has a 45 pack-year smoking history. His smokeless tobacco use includes Chew. [name] reports that he does not drink alcohol.  Review of Systems Complete review of systems negative except as otherwise outlined in the clinical summary and also the following. No cough, fevers or chills. NYHA class II dyspnea. No palpitations.  Physical Examination Filed Vitals:   46/10/15 0836  BP: 132/76  Pulse: 81   Filed Weights   46/10/15 0836  Weight: 294 lb (133.358 kg)    Obese male, comfortable at rest.  HEENT: Conjunctiva and lids normal, oropharynx clear with moist mucosa.  Neck: Supple, increased girth, no carotid bruits, no thyromegaly.  Lungs: Clear to  auscultation, nonlabored breathing at rest.  Cardiac: Regular rate and rhythm, no S3, soft apical systolic murmur, no pericardial rub.  Abdomen: Soft, nontender, protuberant, bowel sounds present, no guarding or rebound.  Extremities: Decreased edema compared to last visit, distal pulses 1-2+.  Skin: Warm and dry. Scattered tattoos.  Musculoskeletal: No kyphosis.  Neuropsychiatric: Alert and oriented x3, affect grossly appropriate.    Problem List and Plan   Coronary atherosclerosis of native coronary  artery Symptomatically stable on medical therapy. He continues on aspirin and statin. Due for follow-up physical with lab work per Dr. Leandrew Cantrell early next year. We will keep a 6 month follow-up pattern. Can discuss follow-up ischemic testing within the next year.  Cor pulmonale Mildly reduced RV contraction. Mixed picture, pulmonary arterial greater than pulmonary venous based on prior workup, although PASP down to 33 mmHg based on most recent echocardiogram. He has not been compliant with regular dose of BiPAP, I reinforced the importance of this with him today. Keep follow-up in the Pulmonary clinic. He continues on Demadex.  Mixed hyperlipidemia Continues on Lipitor. Keep follow-up with Dr. Leandrew Cantrell for FLP and LFTs.    Cesar Cantrell, M.D., F.A.C.C.

## 2015-01-07 ENCOUNTER — Other Ambulatory Visit: Payer: Self-pay | Admitting: Pulmonary Disease

## 2015-01-07 DIAGNOSIS — G4733 Obstructive sleep apnea (adult) (pediatric): Secondary | ICD-10-CM

## 2015-01-18 ENCOUNTER — Encounter: Payer: Self-pay | Admitting: Cardiology

## 2015-01-18 ENCOUNTER — Ambulatory Visit (INDEPENDENT_AMBULATORY_CARE_PROVIDER_SITE_OTHER): Payer: Medicaid - Out of State | Admitting: Cardiology

## 2015-01-18 ENCOUNTER — Encounter: Payer: Self-pay | Admitting: *Deleted

## 2015-01-18 VITALS — BP 154/88 | HR 80 | Ht 69.0 in | Wt 302.8 lb

## 2015-01-18 DIAGNOSIS — E782 Mixed hyperlipidemia: Secondary | ICD-10-CM | POA: Diagnosis not present

## 2015-01-18 DIAGNOSIS — I272 Pulmonary hypertension, unspecified: Secondary | ICD-10-CM

## 2015-01-18 DIAGNOSIS — I270 Primary pulmonary hypertension: Secondary | ICD-10-CM

## 2015-01-18 DIAGNOSIS — I25810 Atherosclerosis of coronary artery bypass graft(s) without angina pectoris: Secondary | ICD-10-CM

## 2015-01-18 NOTE — Progress Notes (Signed)
Cardiology Office Note  Date: 01/18/2015   ID: DAZON SURGES, DOB [birth year], MRN [id number]  PCP: Juliette Alcide, MD  Primary Cardiologist: Nona Dell, MD   Chief Complaint  Patient presents with  . Coronary Artery Disease  . Pulmonary hypertension    History of Present Illness: Cesar Cantrell is a 49 y.o. male last seen in December 2015. He presents for a routine follow-up visit. He denies having any significant angina symptoms. Cardiac medical therapy is reviewed below. He reports no nitroglycerin use.  He tells me that he will be seeing Dr. Leandrew Koyanagi within the next month for a physical with lab work.  Echocardiogram from within the last 2 years is noted below and showed normal LVEF and improved pulmonary hypertension. I reinforced with him the importance of using his BiPAP at home, followed by Pulmonary with obesity hypoventilation syndrome. Unfortunately, he remains noncompliant with regular therapy.  Last ischemic evaluation was in 2011.   Past Medical History  Diagnosis Date  . Coronary atherosclerosis of native coronary artery     Multivessel status post CABG  . Essential hypertension, benign   . Pneumonia   . Pulmonary hypertension     PASP 80 mmHg at [facility] 01/2013  . Type 2 diabetes mellitus   . Sleep apnea     On BIPAP  . Cor pulmonale   . Obesity hypoventilation syndrome     Past Surgical History  Procedure Laterality Date  . Coronary artery bypass graft  09-20-04    LIMA to LAD, left radial to D1, SVG to ramus and SVG to PDA/PLA  . Right heart catheterization N/A 01/20/2013    Procedure: RIGHT HEART CATH;  Surgeon: Wendall Stade, MD;  Location: [facility] CATH LAB;  Service: Cardiovascular;  Laterality: N/A;    Current Outpatient Prescriptions  Medication Sig Dispense Refill  . albuterol (PROVENTIL HFA;VENTOLIN HFA) 108 (90 BASE) MCG/ACT inhaler Inhale 2 puffs into the lungs every 6 (six) hours as needed for wheezing. 1 Inhaler 2  . aspirin EC 81 MG  tablet Take 81 mg by mouth every morning.    Marland Kitchen atorvastatin (LIPITOR) 40 MG tablet Take 1 tablet by mouth daily.    Marland Kitchen glimepiride (AMARYL) 4 MG tablet Take 4 mg by mouth 2 (two) times daily with a meal.    . insulin glargine (LANTUS) 100 UNIT/ML injection Inject 60 Units into the skin daily.    . metFORMIN (GLUCOPHAGE) 1000 MG tablet Take 1 tablet by mouth 2 (two) times daily.    . Omega-3 Fatty Acids (FISH OIL) 1200 MG CAPS Take 1 capsule by mouth daily.     Marland Kitchen torsemide (DEMADEX) 20 MG tablet TAKE ONE TABLET BY MOUTH TWICE DAILY 60 tablet 2   No current facility-administered medications for this visit.    Allergies:  Review of patient's allergies indicates no known allergies.   Social History: The patient  reports that he quit smoking about 1 years ago. His smoking use included Cigarettes. He started smoking about 32 years ago. He has a 45 pack-year smoking history. His smokeless tobacco use includes Chew. He reports that he does not drink alcohol or use illicit drugs.   ROS:  Please see the history of present illness. Otherwise, complete review of systems is positive for NYHA class II dyspnea.  All other systems are reviewed and negative.   Physical Exam: VS:  BP 154/88 mmHg  Pulse 80  Ht 5\' 9"  (1.753 m)  Wt 302 lb 12.8 oz (137.349  kg)  BMI 44.70 kg/m2  SpO2 94%, BMI Body mass index is 44.7 kg/(m^2).  Wt Readings from Last 3 Encounters:  01/18/15 302 lb 12.8 oz (137.349 kg)  07/16/14 294 lb (133.358 kg)  06/16/14 296 lb (134.265 kg)     Obese male, comfortable at rest.  HEENT: Conjunctiva and lids normal, oropharynx clear with moist mucosa.  Neck: Supple, increased girth, no carotid bruits, no thyromegaly.  Lungs: Clear to auscultation, nonlabored breathing at rest.  Cardiac: Regular rate and rhythm, no S3, soft apical systolic murmur, no pericardial rub.  Abdomen: Soft, nontender, protuberant, bowel sounds present, no guarding or rebound.  Extremities: Decreased edema  compared to last visit, distal pulses 1-2+.  Skin: Warm and dry. Scattered tattoos.  Musculoskeletal: No kyphosis.  Neuropsychiatric: Alert and oriented x3, affect grossly appropriate.    ECG: ECG is not ordered today.   Other Studies Reviewed Today:  Echocardiogram 05/01/2013: Study Conclusions  - Left ventricle: The cavity size was at the upper limits of normal. There was mild concentric hypertrophy. Systolic function was normal. The estimated ejection fraction was in the range of 55% to 60%. There is hypokinesis of the basalinferolateral and inferoseptal myocardium. Features are consistent with a pseudonormal left ventricular filling pattern, with concomitant abnormal relaxation and increased filling pressure (grade 2 diastolic dysfunction). - Aortic valve: Probably trileaflet; mildly calcified leaflets. Trivial regurgitation. Mean gradient: 5mm Hg (S). Valve area: 3.57cm^2(VTI). - Aortic root: Descending aorta with increased gradient 16 mmHg - not welll seen, but question of mild narrowing/coartaction discussed previously. Could be further assessed by CTA in clinically significant. - Mitral valve: Trivial regurgitation. - Left atrium: The atrium was mildly dilated. - Right ventricle: The cavity size was moderately dilated. Systolic function was mildly reduced. - Right atrium: The atrium was moderately dilated. Central venous pressure: 8mm Hg (est). - Tricuspid valve: Trivial regurgitation. - Pulmonary arteries: PA peak pressure: 33mm Hg (S). - Pericardium, extracardiac: There was no pericardial effusion. Impressions:  - Comparison to prior study March 2011. Mild LVH with upper normal chamber size and LVEF 55-60%, mild hypokinesis of the basal inferolateral and inferoseptal wall. Grade 2 diastolic dysfunction. Mild left atrial and moderate right atrial enlargement. Mildly calcified aortic valve with trivial regurgitation  seen intermittently. Moderate RV enlargement with mildly reduced contraction. Trivial tricuspid regurgitation with PASP 33 mmHg.   Assessment and Plan:  1. Multivessel CAD status post CABG as outlined above. It has been 5 years since his last ischemic evaluation, plan to proceed with an exercise Cardiolite on medical therapy.  2. OSA and obesity hypoventilation syndrome with pulmonary hypertension. He is noncompliant with BiPAP. Fortunately, PASP had come down to 33 mmHg as of his last echocardiogram.  3. Hyperlipidemia, on Lipitor and omega-3 supplements. Keep follow-up with Dr. Leandrew Koyanagi for repeat lab work.  Current medicines were reviewed with the patient today.   Orders Placed This Encounter  Procedures  . NM Myocar Multi W/Spect W/Wall Motion / EF    Disposition: FU with me in 6 months.   Signed, Jonelle Sidle, MD, [facility] 01/18/2015 1:59 PM    [facility] at [facility] [address] [place], [place], [place] [address] Phone: [phone]; Fax: [phone]

## 2015-01-18 NOTE — Patient Instructions (Signed)
Your physician recommends that you continue on your current medications as directed. Please refer to the Current Medication list given to you today. Your physician has requested that you have en exercise stress myoview. For further information please visit [email or web address] Please follow instruction sheet, as given. Your physician recommends that you schedule a follow-up appointment in: 6 months. You will receive a reminder letter in the mail in about 4 months reminding you to call and schedule your appointment. If you don't receive this letter, please contact our office.

## 2015-01-26 ENCOUNTER — Ambulatory Visit (INDEPENDENT_AMBULATORY_CARE_PROVIDER_SITE_OTHER): Payer: Medicaid - Out of State | Admitting: Pulmonary Disease

## 2015-01-26 ENCOUNTER — Encounter: Payer: Self-pay | Admitting: Pulmonary Disease

## 2015-01-26 VITALS — BP 148/86 | HR 84 | Ht 68.0 in | Wt 298.6 lb

## 2015-01-26 DIAGNOSIS — G4733 Obstructive sleep apnea (adult) (pediatric): Secondary | ICD-10-CM

## 2015-01-26 DIAGNOSIS — J9611 Chronic respiratory failure with hypoxia: Secondary | ICD-10-CM | POA: Diagnosis not present

## 2015-01-26 NOTE — Progress Notes (Signed)
   Subjective:    Patient ID: Cesar Cantrell, male    DOB: [birth year], 49 y.o.   MRN: [id number]  HPI  PCP -Griselda Miner   49 year old obese smoker, with OSA/OHS, severe pulm htn & cor pulmonale  Adm 01/2013 for bilateral lower extremity edema , cor pulmonale and right heart failure.  He developed acute hypercarbic respiratory failure, with ABG pH 7.21, pCO2 98, pO2 63 and bicarb 39, on 5L.  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 Charlett Lango. Initial ejection fraction 39% by cardiac catheterization, but subsequent normalization on rpt echo He was discharged on bipap nocturnal with 4L O2  He diuresed well, compliant with bipap & O2 since dc , has come off O2 daytime (by himself) & has quit smoking      01/26/2015  Chief Complaint  Patient presents with  . Follow-up    Has not been wearing BiPAP as directed. Pt states that it "gets on his nerves". Unable to tolerate machine/mask- feels as thought he is smothering. Wears 2L O2 at night.   Not used bipap x 1 yr - smothering Feels ok - denies somnolence, no bed partner sleep history available  -wears oxygen only during sleep  No smoking since 01/2013 .  No worsening of LE edema.  Breathing is at baseline with no flare in cough or wheezing.  No ER or hospital admits since last ov.  No chest pain, orthopnea, edema or fever. He has been noted to desatn on walking  On prior visits- recovers quickly  he has not lost any weight      Significant tests/ events  01/2013 Echo: LVEF 55-60%, mild LA enlargement, mod RA enlargement, mild to mod RV dilatation, estimated RVSP 62 mmHg  01/20/13 RHC: RV: 87/9 mm Hg, PA 84/81 mm Hg with mean 57 mm Hg, PCWP 25 mm Hg. Fick Cardiac Output: 5.99 L/minute ,2.43 L/min/M2, severe  pulmonary hypertension  6/19 BLE Doppler: Negative for DVTs bilaterally   PSG 02/2013 confirmed severe OSA  with an AHI of 63. He did require supplemental oxygen due to persistent desaturations on BiPAP.  He was set at 13/9 with 2 L of oxygen   04/30/13 Spirometry Fev1 60%, ratio 82, no BD response   Review of Systems neg for any significant sore throat, dysphagia, itching, sneezing, nasal congestion or excess/ purulent secretions, fever, chills, sweats, unintended wt loss, pleuritic or exertional cp, hempoptysis, orthopnea pnd or change in chronic leg swelling. Also denies presyncope, palpitations, heartburn, abdominal pain, nausea, vomiting, diarrhea or change in bowel or urinary habits, dysuria,hematuria, rash, arthralgias, visual complaints, headache, numbness weakness or ataxia.     Objective:   Physical Exam Gen. Pleasant, obese, in no distress ENT - no lesions, no post nasal drip Neck: No JVD, no thyromegaly, no carotid bruits Lungs: no use of accessory muscles, no dullness to percussion, decreased without rales or rhonchi  Cardiovascular: Rhythm regular, heart sounds  normal, no murmurs or gallops, no peripheral edema Musculoskeletal: No deformities, no cyanosis or clubbing , no tremors        Assessment & Plan:

## 2015-01-26 NOTE — Patient Instructions (Signed)
Get back on Bipap machine We will try to get you nasal pillows  If pressure too high - call us & we can drop If unable to tolerate machine inspite of above, call us & we can repeat home study

## 2015-01-26 NOTE — Assessment & Plan Note (Addendum)
Today I spent most of the time explaining him the rationale for BiPAP and convincing him to Get back on Bipap machine The main issue seems to be this feeling of smothering -We will try to get you nasal pillows  If pressure too high - call us & we can drop If unable to tolerate machine inspite of above, call us & we can repeat home study  Weight loss encouraged, compliance with goal of at least 4-6 hrs every night is the expectation. Advised against medications with sedative side effects Cautioned against driving when sleepy - understanding that sleepiness will vary on a day to day basis

## 2015-01-27 NOTE — Assessment & Plan Note (Signed)
Continue to use oxygen blended into BiPAP -We'll reassess need for daytime oxygen next visit

## 2015-02-11 ENCOUNTER — Telehealth: Payer: Self-pay | Admitting: Cardiology

## 2015-02-11 NOTE — Telephone Encounter (Signed)
Noted  

## 2015-02-11 NOTE — Telephone Encounter (Signed)
per mother Cesar Cantrell patient lost insurance on 02/04/2015 will call back and re-schedule his nuclear stress when he gets insurance .

## 2015-02-15 ENCOUNTER — Encounter (HOSPITAL_COMMUNITY): Payer: Medicaid - Out of State

## 2015-02-15 ENCOUNTER — Inpatient Hospital Stay (HOSPITAL_COMMUNITY): Admission: RE | Admit: 2015-02-15 | Payer: Medicaid - Out of State | Source: Ambulatory Visit

## 2015-07-19 ENCOUNTER — Encounter: Payer: Self-pay | Admitting: *Deleted

## 2015-07-19 ENCOUNTER — Encounter: Payer: Self-pay | Admitting: Cardiology

## 2015-07-19 ENCOUNTER — Ambulatory Visit (INDEPENDENT_AMBULATORY_CARE_PROVIDER_SITE_OTHER): Payer: Self-pay | Admitting: Cardiology

## 2015-07-19 VITALS — BP 133/82 | HR 85 | Ht 69.0 in | Wt 305.6 lb

## 2015-07-19 DIAGNOSIS — I10 Essential (primary) hypertension: Secondary | ICD-10-CM

## 2015-07-19 DIAGNOSIS — I272 Other secondary pulmonary hypertension: Secondary | ICD-10-CM

## 2015-07-19 DIAGNOSIS — I2510 Atherosclerotic heart disease of native coronary artery without angina pectoris: Secondary | ICD-10-CM

## 2015-07-19 NOTE — Progress Notes (Signed)
Cardiology Office Note  Date: 07/19/2015   ID: Cesar Cantrell, DOB [birth year], MRN [id number]  PCP: Juliette Alcide, MD  Primary Cardiologist: Nona Dell, MD   Chief Complaint  Patient presents with  . Coronary Artery Disease    History of Present Illness: Cesar Cantrell is a 49 y.o. male last seen in June. He presents for a follow-up visit. Since last evaluation he denies any significant angina symptoms and reports NYHA class II dyspnea. He continues to follow with Dr. Leandrew Koyanagi, we are requesting the most recent lab work.  Medications are outlined below. We discussed these today. He continues on aspirin, Lipitor, Demadex, and omega-3 supplements. Report any nitroglycerin use recently.  We did recommend a follow-up Cardiolite study around the time of his last visit since it had been 5 years since his previous ischemic workup. He did not present for testing. He prefers to hold off for now.  Follow-up ECG today is stable showing sinus rhythm with old inferior infarct pattern.  Past Medical History  Diagnosis Date  . Coronary atherosclerosis of native coronary artery     Multivessel status post CABG  . Essential hypertension, benign   . Pneumonia   . Pulmonary hypertension (HCC)     PASP 80 mmHg at [facility] 01/2013  . Type 2 diabetes mellitus (HCC)   . Sleep apnea     On BIPAP  . Cor pulmonale (HCC)   . Obesity hypoventilation syndrome (HCC)     Current Outpatient Prescriptions  Medication Sig Dispense Refill  . albuterol (PROVENTIL HFA;VENTOLIN HFA) 108 (90 BASE) MCG/ACT inhaler Inhale 2 puffs into the lungs every 6 (six) hours as needed for wheezing. 1 Inhaler 2  . aspirin EC 81 MG tablet Take 81 mg by mouth every morning.    Marland Kitchen atorvastatin (LIPITOR) 40 MG tablet Take 1 tablet by mouth daily.    Marland Kitchen glimepiride (AMARYL) 4 MG tablet Take 4 mg by mouth 2 (two) times daily with a meal.    . metFORMIN (GLUCOPHAGE) 1000 MG tablet Take 1 tablet by mouth 2 (two) times daily.      . Omega-3 Fatty Acids (FISH OIL) 1200 MG CAPS Take 1 capsule by mouth daily.     Marland Kitchen torsemide (DEMADEX) 20 MG tablet TAKE ONE TABLET BY MOUTH TWICE DAILY 60 tablet 2  . TOUJEO SOLOSTAR 300 UNIT/ML SOPN Inject 30 Units into the skin 2 (two) times daily.  6   No current facility-administered medications for this visit.   Allergies:  Review of patient's allergies indicates no known allergies.   Social History: The patient  reports that he quit smoking about 2 years ago. His smoking use included Cigarettes. He started smoking about 33 years ago. He has a 45 pack-year smoking history. His smokeless tobacco use includes Chew. He reports that he does not drink alcohol or use illicit drugs.   ROS:  Please see the history of present illness. Otherwise, complete review of systems is positive for arthritic pains occasionally.  All other systems are reviewed and negative.   Physical Exam: VS:  BP 133/82 mmHg  Pulse 85  Ht  (1.753 m)  Wt 305 lb 9.6 oz (138.619 kg)  BMI 45.11 kg/m2  SpO2 95%, BMI Body mass index is 45.11 kg/(m^2).  Wt Readings from Last 3 Encounters:  07/19/15 305 lb 9.6 oz (138.619 kg)  01/26/15 298 lb 9.6 oz (135.444 kg)  01/18/15 302 lb 12.8 oz (137.349 kg)    Obese male, comfortable at  rest.  HEENT: Conjunctiva and lids normal, oropharynx clear with moist mucosa.  Neck: Supple, increased girth, no carotid bruits, no thyromegaly.  Lungs: Clear to auscultation, nonlabored breathing at rest.  Cardiac: Regular rate and rhythm, no S3, soft apical systolic murmur, no pericardial rub.  Abdomen: Soft, nontender, protuberant, bowel sounds present, no guarding or rebound.  Extremities: Decreased edema compared to last visit, distal pulses 1-2+.  Skin: Warm and dry. Scattered tattoos.  Musculoskeletal: No kyphosis.  Neuropsychiatric: Alert and oriented x3, affect grossly appropriate.   ECG: ECG is ordered today.  Other Studies Reviewed Today:  Echocardiogram  05/01/2013: Study Conclusions  - Left ventricle: The cavity size was at the upper limits of normal. There was mild concentric hypertrophy. Systolic function was normal. The estimated ejection fraction was in the range of 55% to 60%. There is hypokinesis of the basalinferolateral and inferoseptal myocardium. Features are consistent with a pseudonormal left ventricular filling pattern, with concomitant abnormal relaxation and increased filling pressure (grade 2 diastolic dysfunction). - Aortic valve: Probably trileaflet; mildly calcified leaflets. Trivial regurgitation. Mean gradient: 5mm Hg (S). Valve area: 3.57cm^2(VTI). - Aortic root: Descending aorta with increased gradient 16 mmHg - not welll seen, but question of mild narrowing/coartaction discussed previously. Could be further assessed by CTA in clinically significant. - Mitral valve: Trivial regurgitation. - Left atrium: The atrium was mildly dilated. - Right ventricle: The cavity size was moderately dilated. Systolic function was mildly reduced. - Right atrium: The atrium was moderately dilated. Central venous pressure: 8mm Hg (est). - Tricuspid valve: Trivial regurgitation. - Pulmonary arteries: PA peak pressure: 33mm Hg (S). - Pericardium, extracardiac: There was no pericardial effusion. Impressions:  - Comparison to prior study March 2011. Mild LVH with upper normal chamber size and LVEF 55-60%, mild hypokinesis of the basal inferolateral and inferoseptal wall. Grade 2 diastolic dysfunction. Mild left atrial and moderate right atrial enlargement. Mildly calcified aortic valve with trivial regurgitation seen intermittently. Moderate RV enlargement with mildly reduced contraction. Trivial tricuspid regurgitation with PASP 33 mmHg.  Assessment and Plan:  1. Multivessel CAD status post CABG in 2006. As noted above, he prefers to hold off on follow-up ischemic testing. We  will continue medical therapy and observation. ECG reviewed and stable. Requesting most recent lab work for review.  2. Essential hypertension, blood pressure control is adequate today.  3. History of pulmonary hypertension with improvement and PASP down to 33 mmHg by most recent echocardiogram. No progressive shortness of breath.  Current medicines were reviewed with the patient today.   Orders Placed This Encounter  Procedures  . EKG 12-Lead    Disposition: FU with me in 6 months.   Signed, Jonelle SidleSamuel G. Jenika Chiem, MD, [facility] 07/19/2015 3:28 PM    [facility] [address], [place], [place] [address] Phone: [phone]; Fax: [phone]

## 2015-07-19 NOTE — Patient Instructions (Signed)
Your physician recommends that you continue on your current medications as directed. Please refer to the Current Medication list given to you today. Your physician recommends that you schedule a follow-up appointment in: 6 months. You will receive a reminder letter in the mail in about 4 months reminding you to call and schedule your appointment. If you don't receive this letter, please contact our office. 

## 2015-09-07 ENCOUNTER — Ambulatory Visit (INDEPENDENT_AMBULATORY_CARE_PROVIDER_SITE_OTHER): Payer: Self-pay | Admitting: Pulmonary Disease

## 2015-09-07 ENCOUNTER — Encounter: Payer: Self-pay | Admitting: Pulmonary Disease

## 2015-09-07 VITALS — BP 164/110 | HR 89 | Ht 69.0 in | Wt 301.4 lb

## 2015-09-07 DIAGNOSIS — G4733 Obstructive sleep apnea (adult) (pediatric): Secondary | ICD-10-CM

## 2015-09-07 DIAGNOSIS — R0602 Shortness of breath: Secondary | ICD-10-CM

## 2015-09-07 DIAGNOSIS — I10 Essential (primary) hypertension: Secondary | ICD-10-CM

## 2015-09-07 DIAGNOSIS — Z23 Encounter for immunization: Secondary | ICD-10-CM

## 2015-09-07 DIAGNOSIS — J9612 Chronic respiratory failure with hypercapnia: Secondary | ICD-10-CM

## 2015-09-07 NOTE — Progress Notes (Signed)
   Subjective:    Patient ID: Cesar Cantrell, male    DOB: [birth year], 50 y.o.   MRN: [id number]  HPI  PCP -Griselda Miner   50 year old obese smoker, with OSA/OHS, severe pulm htn & cor pulmonale  Adm 01/2013 for bilateral lower extremity edema , cor pulmonale and right heart failure.  He developed acute hypercarbic respiratory failure. 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 Charlett Lango. He was discharged on bipap nocturnal with 4L O2  He diuresed well, came off O2 daytime  & quit smoking   09/07/2015  Chief Complaint  Patient presents with  . Follow-up    Patient is not using BiPAP or CPAP machine.  Patient states that he never received the nasal pillows, says that he would not use them anyway.  Patient says that he does not have sleep apnea.    19m FU Very poor compliance - not using Bipap or o2 inspite of counselling last several visits - feels he does not need this  BP very high today Wt up 4 lbs - feels he is eating to much Reports compliance with toresemide   No worsening of LE edema.  Breathing is at baseline with no flare in cough or wheezing.  No ER or hospital admits since last ov.  No chest pain, orthopnea, edema or fever.  Does not desaturate on ambulation     Significant tests/ events  01/2013 Echo: LVEF 55-60%, mild LA enlargement, mod RA enlargement, mild to mod RV dilatation, estimated RVSP 62  01/20/13 RHC: RV: 87/9 mm Hg, PA 84/81 mm Hg with mean 57 mm Hg, PCWP 25 mm Hg. Fick Cardiac Output: 5.99 L/minute ,2.43 L/min/M2, severe pulmonary hypertension  6/19 BLE Doppler: Negative for DVTs bilaterally   PSG 02/2013 confirmed severe OSA  with an AHI of 63. He did require supplemental oxygen due to persistent desaturations on BiPAP.  He was set at 13/9 with 2 L of oxygen   04/2013 Spirometry Fev1 60%, ratio 82, no BD response     Review of Systems neg for any significant sore throat, dysphagia, itching, sneezing, nasal congestion or excess/ purulent secretions, fever, chills, sweats, unintended wt loss, pleuritic or exertional cp, hempoptysis, orthopnea pnd or change in chronic leg swelling.  Also denies presyncope, palpitations, heartburn, abdominal pain, nausea, vomiting, diarrhea or change in bowel or urinary habits, dysuria,hematuria, rash, arthralgias, visual complaints, headache, numbness weakness or ataxia.     Objective:   Physical Exam  Gen. Pleasant, obese, in no distress ENT - no lesions, no post nasal drip Neck: No JVD, no thyromegaly, no carotid bruits Lungs: no use of accessory muscles, no dullness to percussion, decreased without rales or rhonchi  Cardiovascular: Rhythm regular, heart sounds  normal, no murmurs or gallops, 1+ peripheral edema Musculoskeletal: No deformities, no cyanosis or clubbing , no tremors       Assessment & Plan:

## 2015-09-07 NOTE — Assessment & Plan Note (Signed)
Continue oxygen during sleep He does not seem to need this in the daytime

## 2015-09-07 NOTE — Patient Instructions (Signed)
Recheck BP Ambulatory satn Sleep study

## 2015-09-07 NOTE — Assessment & Plan Note (Signed)
He will recheck at PCP

## 2015-09-07 NOTE — Assessment & Plan Note (Signed)
He is not compliant with BiPAP in spite of several counseling attempts. I advised repeat sleep study but he does not want to do this at this current time due to expense. I have offered him mask desensitization visits but he has declined-he is just not interested in CPAP therapy. I had a frank discussion with him about cardiovascular consequences

## 2016-01-14 ENCOUNTER — Encounter: Payer: Self-pay | Admitting: Cardiology

## 2016-01-14 ENCOUNTER — Ambulatory Visit (INDEPENDENT_AMBULATORY_CARE_PROVIDER_SITE_OTHER): Payer: Self-pay | Admitting: Cardiology

## 2016-01-14 ENCOUNTER — Encounter: Payer: Self-pay | Admitting: *Deleted

## 2016-01-14 VITALS — BP 154/90 | HR 77 | Ht 69.0 in | Wt 303.0 lb

## 2016-01-14 DIAGNOSIS — E782 Mixed hyperlipidemia: Secondary | ICD-10-CM

## 2016-01-14 DIAGNOSIS — I2781 Cor pulmonale (chronic): Secondary | ICD-10-CM

## 2016-01-14 DIAGNOSIS — I10 Essential (primary) hypertension: Secondary | ICD-10-CM

## 2016-01-14 DIAGNOSIS — I2510 Atherosclerotic heart disease of native coronary artery without angina pectoris: Secondary | ICD-10-CM

## 2016-01-14 MED ORDER — NITROGLYCERIN 0.4 MG SL SUBL: 0.4000 mg | SUBLINGUAL_TABLET | SUBLINGUAL | Status: DC | PRN

## 2016-01-14 NOTE — Patient Instructions (Addendum)
Nitroglycerin as needed for severe chest pain only - [facility].  Continue all other current medications. Your physician wants you to follow up in: 6 months.  You will receive a reminder letter in the mail one-two months in advance.  If you don't receive a letter, please call our office to schedule the follow up appointment

## 2016-01-14 NOTE — Progress Notes (Signed)
Cardiology Office Note  Date: 01/14/2016   ID: Cesar Cantrell, DOB 24-Jun-1966, MRN [id number]  PCP: Juliette Alcide, MD  Primary Cardiologist: Nona Dell, MD   Chief Complaint  Patient presents with  . Coronary Artery Disease    History of Present Illness: Cesar Cantrell is a 50 y.o. male last seen in December 2016. He presents for a follow-up visit. Denies any significant angina symptoms with typical activities. He works as a Conservation officer, historic buildings.  I reviewed his medications. Cardiac regimen includes aspirin, Lipitor, Demadex, and omega-3 supplements. We discussed providing prescription for nitroglycerin as needed.  I have recommended follow-up ischemic testing with prior CABG in 2006, however he has prefers to hold off. We have discussed warning signs and symptoms that should prompt further evaluation.  He continues to follow with Dr. Leandrew Koyanagi on a regular basis. I am requesting his most recent lab work.  Past Medical History  Diagnosis Date  . Coronary atherosclerosis of native coronary artery     Multivessel status post CABG  . Essential hypertension, benign   . Pneumonia   . Pulmonary hypertension (HCC)     PASP 80 mmHg at [facility] 01/2013  . Type 2 diabetes mellitus (HCC)   . Sleep apnea     On BIPAP  . Cor pulmonale (HCC)   . Obesity hypoventilation syndrome [facility])     Past Surgical History  Procedure Laterality Date  . Coronary artery bypass graft  09-20-04    LIMA to LAD, left radial to D1, SVG to ramus and SVG to PDA/PLA  . Right heart catheterization N/A 01/20/2013    Procedure: RIGHT HEART CATH;  Surgeon: Wendall Stade, MD;  Location: [facility] CATH LAB;  Service: Cardiovascular;  Laterality: N/A;    Current Outpatient Prescriptions  Medication Sig Dispense Refill  . albuterol (PROVENTIL HFA;VENTOLIN HFA) 108 (90 BASE) MCG/ACT inhaler Inhale 2 puffs into the lungs every 6 (six) hours as needed for wheezing. 1 Inhaler 2  . aspirin EC 81 MG tablet Take 81 mg by mouth  every morning.    Marland Kitchen atorvastatin (LIPITOR) 40 MG tablet Take 1 tablet by mouth daily.    Marland Kitchen glimepiride (AMARYL) 4 MG tablet Take 4 mg by mouth 2 (two) times daily with a meal.    . metFORMIN (GLUCOPHAGE) 1000 MG tablet Take 1 tablet by mouth 2 (two) times daily.    . Omega-3 Fatty Acids (FISH OIL) 1200 MG CAPS Take 1 capsule by mouth daily.     Marland Kitchen torsemide (DEMADEX) 20 MG tablet TAKE ONE TABLET BY MOUTH TWICE DAILY 60 tablet 2  . TOUJEO SOLOSTAR 300 UNIT/ML SOPN Inject 30 Units into the skin 2 (two) times daily.  6   No current facility-administered medications for this visit.   Allergies:  Review of patient's allergies indicates no known allergies.   Social History: The patient  reports that he quit smoking about 2 years ago. His smoking use included Cigarettes. He started smoking about 33 years ago. He has a 45 pack-year smoking history. He has quit using smokeless tobacco. His smokeless tobacco use included Chew. He reports that he does not drink alcohol or use illicit drugs.    ROS:  Please see the history of present illness. Otherwise, complete review of systems is positive for none.  All other systems are reviewed and negative.   Physical Exam: VS:  BP 154/90 mmHg  Pulse 77  Ht  (1.753 m)  Wt 303 lb (137.44 kg)  BMI  44.72 kg/m2  SpO2 92%, BMI Body mass index is 44.72 kg/(m^2).  Wt Readings from Last 3 Encounters:  01/14/16 303 lb (137.44 kg)  09/07/15 301 lb 6.4 oz (136.714 kg)  07/19/15 305 lb 9.6 oz (138.619 kg)    Obese male, comfortable at rest.  HEENT: Conjunctiva and lids normal, oropharynx clear with moist mucosa.  Neck: Supple, increased girth, no carotid bruits, no thyromegaly.  Lungs: Clear to auscultation, nonlabored breathing at rest.  Cardiac: Regular rate and rhythm, no S3, soft apical systolic murmur, no pericardial rub.  Abdomen: Soft, nontender, protuberant, bowel sounds present, no guarding or rebound.  Extremities: Decreased edema compared to  last visit, distal pulses 1-2+.  Skin: Warm and dry. Scattered tattoos.  Musculoskeletal: No kyphosis.  Neuropsychiatric: Alert and oriented x3, affect grossly appropriate.   ECG: I personally reviewed the tracing from 07/19/2015 which showed sinus rhythm with IVCD and old inferior infarct pattern.  Recent Labwork:  July 2016: BUN 8, creatinine 0.8, potassium 4.1, AST 19, ALT 28, hemoglobin A1c 7.6, cholesterol 135, triglycerides 132, HDL 32, LDL 77  Other Studies Reviewed Today:  Echocardiogram 05/01/2013: Study Conclusions  - Left ventricle: The cavity size was at the upper limits of normal. There was mild concentric hypertrophy. Systolic function was normal. The estimated ejection fraction was in the range of 55% to 60%. There is hypokinesis of the basalinferolateral and inferoseptal myocardium. Features are consistent with a pseudonormal left ventricular filling pattern, with concomitant abnormal relaxation and increased filling pressure (grade 2 diastolic dysfunction). - Aortic valve: Probably trileaflet; mildly calcified leaflets. Trivial regurgitation. Mean gradient: 5mm Hg (S). Valve area: 3.57cm^2(VTI). - Aortic root: Descending aorta with increased gradient 16 mmHg - not welll seen, but question of mild narrowing/coartaction discussed previously. Could be further assessed by CTA in clinically significant. - Mitral valve: Trivial regurgitation. - Left atrium: The atrium was mildly dilated. - Right ventricle: The cavity size was moderately dilated. Systolic function was mildly reduced. - Right atrium: The atrium was moderately dilated. Central venous pressure: 8mm Hg (est). - Tricuspid valve: Trivial regurgitation. - Pulmonary arteries: PA peak pressure: 33mm Hg (S). - Pericardium, extracardiac: There was no pericardial effusion. Impressions:  - Comparison to prior study March 2011. Mild LVH with upper normal chamber size and  LVEF 55-60%, mild hypokinesis of the basal inferolateral and inferoseptal wall. Grade 2 diastolic dysfunction. Mild left atrial and moderate right atrial enlargement. Mildly calcified aortic valve with trivial regurgitation seen intermittently. Moderate RV enlargement with mildly reduced contraction. Trivial tricuspid regurgitation with PASP 33 mmHg.  Assessment and Plan:  1. Multivessel CAD status post CABG in 2006. He continues to prefer observation on medical therapy, holding off on further ischemic testing. Prescription provided for as needed nitroglycerin.  2. Hyperlipidemia, on Lipitor. Requesting was recent lab work from Dr. Leandrew Koyanagi.  3. History of sleep apnea and cor pulmonale with prior documentation of severe pulmonary hypertension. PASP was only 33 mmHg by follow-up assessment. He does not report any progressive shortness of breath.  4. Type 2 diabetes mellitus, followed by Dr. Leandrew Koyanagi.  5. Essential hypertension, blood pressure is elevated today. He reports compliance with his medications. I have discussed weight loss with him as well as sodium restriction. He would also be a reasonable candidate for ARB or ACE inhibitor with concurrent diabetes mellitus and should keep follow-up with Dr. Leandrew Koyanagi.  Current medicines were reviewed with the patient today.  Disposition: FU with me in 6 months.   Signed, Jonelle Sidle, MD,  [facility] 01/14/2016 10:48 AM    [facility] [address], [place], [place] [address] Phone: [phone]; Fax: [phone]

## 2016-07-19 NOTE — Progress Notes (Signed)
Cardiology Office Note  Date: 07/20/2016   ID: Cesar BranchKelly D Crowson, DOB [birth year], MRN [id number]  PCP: Juliette AlcideBURDINE,STEVEN E, MD  Primary Cardiologist: Nona DellSamuel McDowell, MD   Chief Complaint  Patient presents with  . Coronary Artery Disease    History of Present Illness: Cesar Cantrell is a 50 y.o. male last seen in June. He presents for a routine follow-up visit. Continues to deny any significant angina symptoms or nitroglycerin use. Still works, also hunting during season.  We have discussed follow-up surveillance ischemic testing, he has declined unless symptoms intervene.  I reviewed his ECG today which shows sinus rhythm with IVCD, also inferior infarct pattern, nonspecific T-wave changes.  States that he will be having a follow-up visit with Dr. Leandrew KoyanagiBurdine in January.  Past Medical History:  Diagnosis Date  . Cor pulmonale (HCC)   . Coronary atherosclerosis of native coronary artery    Multivessel status post CABG  . Essential hypertension, benign   . Obesity hypoventilation syndrome (HCC)   . Pneumonia   . Pulmonary hypertension    [facility]  . Sleep apnea    On BIPAP  . Type 2 diabetes mellitus (HCC)     Past Surgical History:  Procedure Laterality Date  . CORONARY ARTERY BYPASS GRAFT  09-20-04   LIMA to LAD, left radial to D1, SVG to ramus and SVG to PDA/PLA  . RIGHT HEART CATHETERIZATION N/A 01/20/2013   Procedure: RIGHT HEART CATH;  Surgeon: Wendall StadePeter C Nishan, MD;  [facility];  Service: Cardiovascular;  Laterality: N/A;    Current Outpatient Prescriptions  Medication Sig Dispense Refill  . albuterol (PROVENTIL HFA;VENTOLIN HFA) 108 (90 BASE) MCG/ACT inhaler Inhale 2 puffs into the lungs every 6 (six) hours as needed for wheezing. 1 Inhaler 2  . aspirin EC 81 MG tablet Take 81 mg by mouth every morning.    Marland Kitchen. glimepiride (AMARYL) 4 MG tablet Take 4 mg by mouth 2 (two) times daily with a meal.    . metFORMIN (GLUCOPHAGE) 1000 MG tablet Take 1  tablet by mouth 2 (two) times daily.    . nitroGLYCERIN (NITROSTAT) 0.4 MG SL tablet Place 1 tablet (0.4 mg total) under the tongue every 5 (five) minutes as needed for chest pain. 25 tablet 3  . Omega-3 Fatty Acids (FISH OIL) 1200 MG CAPS Take 1 capsule by mouth daily.     . pravastatin (PRAVACHOL) 80 MG tablet Take 1 tablet by mouth daily.  3  . torsemide (DEMADEX) 20 MG tablet TAKE ONE TABLET BY MOUTH TWICE DAILY 60 tablet 2  . TOUJEO SOLOSTAR 300 UNIT/ML SOPN Inject 30 Units into the skin 2 (two) times daily.  6   No current facility-administered medications for this visit.    Allergies:  Patient has no known allergies.   Social History: The patient  reports that he quit smoking about 3 years ago. His smoking use included Cigarettes. He started smoking about 34 years ago. He has a 45.00 pack-year smoking history. His smokeless tobacco use includes Chew. He reports that he does not drink alcohol or use drugs.   ROS:  Please see the history of present illness. Otherwise, complete review of systems is positive for chronic leg swelling, controlled.  All other systems are reviewed and negative.   Physical Exam: VS:  BP (!) 145/78   Pulse 80   Ht 5\' 9"  (1.753 m)   Wt (!) 303 lb (137.4 kg)   BMI 44.75 kg/m ,  BMI Body mass index is 44.75 kg/m.  Wt Readings from Last 3 Encounters:  07/20/16 (!) 303 lb (137.4 kg)  01/14/16 (!) 303 lb (137.4 kg)  09/07/15 (!) 301 lb 6.4 oz (136.7 kg)    Obese male, comfortable at rest.  HEENT: Conjunctiva and lids normal, oropharynx clear with moist mucosa.  Neck: Supple, increased girth, no carotid bruits, no thyromegaly.  Lungs: Clear to auscultation, nonlabored breathing at rest.  Cardiac: Regular rate and rhythm, no S3, soft apical systolic murmur, no pericardial rub.  Abdomen: Soft, nontender, protuberant, bowel sounds present, no guarding or rebound.  Extremities: Chronic appearing mild leg edema, distal pulses 1-2+.   ECG: I personally  reviewed the tracing from 07/19/2015 which showed sinus rhythm with IVCD and old inferior infarct pattern.  Recent Labwork:    Component Value Date/Time   CHOL 137 11/01/2009 1152   TRIG 166.0 (H) 11/01/2009 1152   HDL 35.60 (L) 11/01/2009 1152   CHOLHDL 4 11/01/2009 1152   VLDL 33.2 11/01/2009 1152   LDLCALC 68 11/01/2009 1152    Other Studies Reviewed Today:  Echocardiogram 05/01/2013: Study Conclusions  - Left ventricle: The cavity size was at the upper limits of normal. There was mild concentric hypertrophy. Systolic function was normal. The estimated ejection fraction was in the range of 55% to 60%. There is hypokinesis of the basalinferolateral and inferoseptal myocardium. Features are consistent with a pseudonormal left ventricular filling pattern, with concomitant abnormal relaxation and increased filling pressure (grade 2 diastolic dysfunction). - Aortic valve: Probably trileaflet; mildly calcified leaflets. Trivial regurgitation. Mean gradient: 5mm Hg (S). Valve area: 3.57cm^2(VTI). - Aortic root: Descending aorta with increased gradient 16 mmHg - not welll seen, but question of mild narrowing/coartaction discussed previously. Could be further assessed by CTA in clinically significant. - Mitral valve: Trivial regurgitation. - Left atrium: The atrium was mildly dilated. - Right ventricle: The cavity size was moderately dilated. Systolic function was mildly reduced. - Right atrium: The atrium was moderately dilated. Central venous pressure: 8mm Hg (est). - Tricuspid valve: Trivial regurgitation. - Pulmonary arteries: PA peak pressure: 33mm Hg (S). - Pericardium, extracardiac: There was no pericardial effusion. Impressions:  - Comparison to prior study March 2011. Mild LVH with upper normal Cantrell size and LVEF 55-60%, mild hypokinesis of the basal inferolateral and inferoseptal wall. Grade 2 diastolic dysfunction. Mild  left atrial and moderate right atrial enlargement. Mildly calcified aortic valve with trivial regurgitation seen intermittently. Moderate RV enlargement with mildly reduced contraction. Trivial tricuspid regurgitation with PASP 33 mmHg.  Assessment and Plan:  1. CAD status post CABG, symptomatically stable without anginal medical therapy. He prefers to hold off on ischemic testing and less symptoms intervene. I reviewed his ECG today.  2. History of pulmonary hypertension with improvement, PASP 33 mmHg by last assessment. He has obesity hypoventilation syndrome with sleep apnea. Continues on Demadex with chronic leg edema.  3. Hyperlipidemia, on Pravachol. He will be seeing Dr. Leandrew KoyanagiBurdine in January.  Current medicines were reviewed with the patient today.   Orders Placed This Encounter  Procedures  . EKG 12-Lead    Disposition: Follow-up in 6 months, sooner if needed.  Signed, Jonelle SidleSamuel G. McDowell, MD, [facility] [place] 07/20/2016 11:18 AM    [facility] [address], [place], [place] [address] Phone: [phone]; Fax: [phone]

## 2016-07-20 ENCOUNTER — Ambulatory Visit (INDEPENDENT_AMBULATORY_CARE_PROVIDER_SITE_OTHER): Payer: Self-pay | Admitting: Cardiology

## 2016-07-20 ENCOUNTER — Encounter: Payer: Self-pay | Admitting: Cardiology

## 2016-07-20 VITALS — BP 145/78 | HR 80 | Ht 69.0 in | Wt 303.0 lb

## 2016-07-20 DIAGNOSIS — I2781 Cor pulmonale (chronic): Secondary | ICD-10-CM

## 2016-07-20 DIAGNOSIS — E782 Mixed hyperlipidemia: Secondary | ICD-10-CM

## 2016-07-20 DIAGNOSIS — I2510 Atherosclerotic heart disease of native coronary artery without angina pectoris: Secondary | ICD-10-CM

## 2016-07-20 NOTE — Patient Instructions (Signed)

## 2017-01-15 NOTE — Progress Notes (Signed)
Cardiology Office Note  Date: 01/17/2017   ID: Cesar Cantrell Youkhana, DOB 05-Apr-1966, MRN [id number]  PCP: Juliette AlcideBurdine, Steven E, MD  Primary Cardiologist: Nona DellSamuel Allesha Aronoff, MD   Chief Complaint  Patient presents with  . Coronary Artery Disease    History of Present Illness: Cesar Cantrell Goldsmith is a 51 y.o. male last seen in December 2017. He presents for a routine follow-up visit. Since last assessment he does not report any changes stamina. He has lost about 10 pounds, states that he is active outdoors doing yard work. He is not reporting any angina symptoms and currently has NYHA class 1-2 dyspnea. We have discussed follow-up surveillance ischemic testing, he has declined unless symptoms intervene.  I reviewed his current medications. Cardiac regimen includes aspirin, Pravachol, omega-3 supplements, Demadex, and as needed nitroglycerin.  He continues to follow with Dr. Leandrew KoyanagiBurdine for primary care.  Past Medical History:  Diagnosis Date  . Cor pulmonale (HCC)   . Coronary atherosclerosis of native coronary artery    Multivessel status post CABG  . Essential hypertension, benign   . Obesity hypoventilation syndrome (HCC)   . Pneumonia   . Pulmonary hypertension (HCC)    [facility]  . Sleep apnea    On BIPAP  . Type 2 diabetes mellitus (HCC)     Past Surgical History:  Procedure Laterality Date  . CORONARY ARTERY BYPASS GRAFT  09-20-04   LIMA to LAD, left radial to D1, SVG to ramus and SVG to PDA/PLA  . RIGHT HEART CATHETERIZATION N/A 01/20/2013   Procedure: RIGHT HEART CATH;  Surgeon: Wendall StadePeter C Nishan, MD;  [facility];  Service: Cardiovascular;  Laterality: N/A;    Current Outpatient Prescriptions  Medication Sig Dispense Refill  . albuterol (PROVENTIL HFA;VENTOLIN HFA) 108 (90 BASE) MCG/ACT inhaler Inhale 2 puffs into the lungs every 6 (six) hours as needed for wheezing. 1 Inhaler 2  . aspirin EC 81 MG tablet Take 81 mg by mouth every morning.    Marland Kitchen. glimepiride  (AMARYL) 4 MG tablet Take 4 mg by mouth 2 (two) times daily with a meal.    . metFORMIN (GLUCOPHAGE) 1000 MG tablet Take 1 tablet by mouth 2 (two) times daily.    . nitroGLYCERIN (NITROSTAT) 0.4 MG SL tablet Place 1 tablet (0.4 mg total) under the tongue every 5 (five) minutes as needed for chest pain. 25 tablet 3  . Omega-3 Fatty Acids (FISH OIL) 1200 MG CAPS Take 1 capsule by mouth daily.     . pravastatin (PRAVACHOL) 80 MG tablet Take 1 tablet by mouth daily.  3  . torsemide (DEMADEX) 20 MG tablet TAKE ONE TABLET BY MOUTH TWICE DAILY 60 tablet 2  . TOUJEO SOLOSTAR 300 UNIT/ML SOPN Inject 30 Units into the skin 2 (two) times daily.  6   No current facility-administered medications for this visit.    Allergies:  Patient has no known allergies.   Social History: The patient  reports that he quit smoking about 3 years ago. His smoking use included Cigarettes. He started smoking about 34 years ago. He has a 45.00 pack-year smoking history. His smokeless tobacco use includes Chew. He reports that he does not drink alcohol or use drugs.   ROS:  Please see the history of present illness. Otherwise, complete review of systems is positive for none.  All other systems are reviewed and negative.   Physical Exam: VS:  BP (!) 154/92   Pulse 64   Ht  5\' 9"  (1.753 m)   Wt 294 lb (133.4 kg)   SpO2 94%   BMI 43.42 kg/m , BMI Body mass index is 43.42 kg/m.  Wt Readings from Last 3 Encounters:  01/17/17 294 lb (133.4 kg)  07/20/16 (!) 303 lb (137.4 kg)  01/14/16 (!) 303 lb (137.4 kg)    Obese male, comfortable at rest.  HEENT: Conjunctiva and lids normal, oropharynx clear.  Neck: Supple, increased girth, no carotid bruits, no thyromegaly.  Lungs: Clear to auscultation, nonlabored breathing at rest.  Cardiac: Regular rate and rhythm, no S3, soft apical systolic murmur, no pericardial rub.  Abdomen: Soft, nontender, protuberant, bowel sounds present, no guarding or rebound.  Extremities: Mild  lower leg edema, distal pulses 1-2+. Skin: Warm and dry, multiple tattoos. Musculoskeletal: No kyphosis. Neuropsychiatric: Alert and oriented 3, affect appropriate.  ECG: I personally reviewed the tracing from 07/20/2016 which showed sinus rhythm with IVCD, old inferior infarct pattern, and nonspecific T-wave changes.  Recent Labwork:  April 2017: BUN 6, creatinine 0.77, potassium 3.7, AST 27, ALT 30, cholesterol 193, glycerides 159, HDL 34, LDL 127, hemoglobin A1c 6.8  Other Studies Reviewed Today:  Echocardiogram 05/01/2013: Study Conclusions  - Left ventricle: The cavity size was at the upper limits of normal. There was mild concentric hypertrophy. Systolic function was normal. The estimated ejection fraction was in the range of 55% to 60%. There is hypokinesis of the basalinferolateral and inferoseptal myocardium. Features are consistent with a pseudonormal left ventricular filling pattern, with concomitant abnormal relaxation and increased filling pressure (grade 2 diastolic dysfunction). - Aortic valve: Probably trileaflet; mildly calcified leaflets. Trivial regurgitation. Mean gradient: 5mm Hg (S). Valve area: 3.57cm^2(VTI). - Aortic root: Descending aorta with increased gradient 16 mmHg - not welll seen, but question of mild narrowing/coartaction discussed previously. Could be further assessed by CTA in clinically significant. - Mitral valve: Trivial regurgitation. - Left atrium: The atrium was mildly dilated. - Right ventricle: The cavity size was moderately dilated. Systolic function was mildly reduced. - Right atrium: The atrium was moderately dilated. Central venous pressure: 8mm Hg (est). - Tricuspid valve: Trivial regurgitation. - Pulmonary arteries: PA peak pressure: 33mm Hg (S). - Pericardium, extracardiac: There was no pericardial effusion.  Impressions:  - Comparison to prior study March 2011. Mild LVH with upper normal  chamber size and LVEF 55-60%, mild hypokinesis of the basal inferolateral and inferoseptal wall. Grade 2 diastolic dysfunction. Mild left atrial and moderate right atrial enlargement. Mildly calcified aortic valve with trivial regurgitation seen intermittently. Moderate RV enlargement with mildly reduced contraction. Trivial tricuspid regurgitation with PASP 33 mmHg.  Assessment and Plan:  1. CAD status post CABG in 2006. He does not report angina on medical therapy. We have discussed follow-up ischemic testing, however he prefers to hold off unless symptoms intervene. Refill provided first fresh bottle of nitroglycerin.  2. History of pulmonary hypertension complicated by obesity hypoventilation syndrome and sleep apnea. PASP 33 mmHg by last echocardiogram. He has been symptomatically stable.  3. Hyperlipidemia on Pravachol. LDL 127 last year. He is trying to lose weight.  4. Tobacco abuse in remission.   Current medicines were reviewed with the patient today.  Disposition: Follow-up in 6 months.  Signed, Jonelle Sidle, MD, [facility] 01/17/2017 9:26 AM    [facility] at [facility] [address], [place], [place] [address] Phone: [phone]; Fax: [phone]

## 2017-01-17 ENCOUNTER — Encounter: Payer: Self-pay | Admitting: Cardiology

## 2017-01-17 ENCOUNTER — Ambulatory Visit (INDEPENDENT_AMBULATORY_CARE_PROVIDER_SITE_OTHER): Payer: Self-pay | Admitting: Cardiology

## 2017-01-17 VITALS — BP 154/92 | HR 64 | Ht 69.0 in | Wt 294.0 lb

## 2017-01-17 DIAGNOSIS — I25810 Atherosclerosis of coronary artery bypass graft(s) without angina pectoris: Secondary | ICD-10-CM

## 2017-01-17 DIAGNOSIS — F17201 Nicotine dependence, unspecified, in remission: Secondary | ICD-10-CM

## 2017-01-17 DIAGNOSIS — E782 Mixed hyperlipidemia: Secondary | ICD-10-CM

## 2017-01-17 DIAGNOSIS — I272 Pulmonary hypertension, unspecified: Secondary | ICD-10-CM

## 2017-01-17 MED ORDER — NITROGLYCERIN 0.4 MG SL SUBL: 0.4000 mg | SUBLINGUAL_TABLET | SUBLINGUAL | 3 refills | Status: DC | PRN

## 2017-01-17 NOTE — Patient Instructions (Signed)

## 2017-07-17 ENCOUNTER — Encounter: Payer: Self-pay | Admitting: *Deleted

## 2017-07-18 ENCOUNTER — Encounter: Payer: Self-pay | Admitting: Cardiology

## 2017-07-18 ENCOUNTER — Ambulatory Visit (INDEPENDENT_AMBULATORY_CARE_PROVIDER_SITE_OTHER): Payer: Self-pay | Admitting: Cardiology

## 2017-07-18 VITALS — BP 144/81 | HR 76 | Ht 69.0 in | Wt 294.0 lb

## 2017-07-18 DIAGNOSIS — E782 Mixed hyperlipidemia: Secondary | ICD-10-CM

## 2017-07-18 DIAGNOSIS — F17201 Nicotine dependence, unspecified, in remission: Secondary | ICD-10-CM

## 2017-07-18 DIAGNOSIS — I25119 Atherosclerotic heart disease of native coronary artery with unspecified angina pectoris: Secondary | ICD-10-CM

## 2017-07-18 MED ORDER — NITROGLYCERIN 0.4 MG SL SUBL: 0.4000 mg | SUBLINGUAL_TABLET | SUBLINGUAL | 3 refills | Status: AC | PRN

## 2017-07-18 NOTE — Progress Notes (Signed)
Cardiology Office Note  Date: 07/18/2017   ID: Cesar Cantrell, DOB 07-Aug-1966, MRN [id number]  PCP: Juliette AlcideBurdine, Steven E, MD  Primary Cardiologist: Nona DellSamuel Meilech Virts, MD   Chief Complaint  Patient presents with  . Coronary Artery Disease    History of Present Illness: Cesar Cantrell is a 51 y.o. male last seen in June.  He presents for a routine follow-up visit.  He does not report any angina or nitroglycerin use, has an old bottle.  He reports NYHA class II dyspnea with typical activities.  He has been working outdoors, also hunting.  Reports no major change in stamina.  He has preferred to hold off on follow-up ischemic testing.  We have discussed warning signs and symptoms.  He reports compliance with his medications.  Current cardiac regimen includes aspirin, Pravachol, Demadex, and as needed nitroglycerin.  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  Past Medical History:  Diagnosis Date  . Cor pulmonale (HCC)   . Coronary atherosclerosis of native coronary artery    Multivessel status post CABG  . Essential hypertension, benign   . Obesity hypoventilation syndrome (HCC)   . Pneumonia   . Pulmonary hypertension (HCC)    [facility] (down to 33 mmHg by subsequent echo 2014)  . Sleep apnea    On BIPAP  . Type 2 diabetes mellitus (HCC)     Past Surgical History:  Procedure Laterality Date  . CORONARY ARTERY BYPASS GRAFT  09-20-04   LIMA to LAD, left radial to D1, SVG to ramus and SVG to PDA/PLA  . RIGHT HEART CATHETERIZATION N/A 01/20/2013   Procedure: RIGHT HEART CATH;  Surgeon: Wendall StadePeter C Nishan, MD;  [facility];  Service: Cardiovascular;  Laterality: N/A;    Current Outpatient Medications  Medication Sig Dispense Refill  . albuterol (PROVENTIL HFA;VENTOLIN HFA) 108 (90 BASE) MCG/ACT inhaler Inhale 2 puffs into the lungs every 6 (six) hours as needed for  wheezing. 1 Inhaler 2  . aspirin EC 81 MG tablet Take 81 mg by mouth every morning.    Marland Kitchen. glimepiride (AMARYL) 4 MG tablet Take 4 mg by mouth 2 (two) times daily with a meal.    . metFORMIN (GLUCOPHAGE) 1000 MG tablet Take 1 tablet by mouth 2 (two) times daily.    . nitroGLYCERIN (NITROSTAT) 0.4 MG SL tablet Place 1 tablet (0.4 mg total) under the tongue every 5 (five) minutes as needed for chest pain. 25 tablet 3  . Omega-3 Fatty Acids (FISH OIL) 1200 MG CAPS Take 1 capsule by mouth daily.     . pravastatin (PRAVACHOL) 80 MG tablet Take 1 tablet by mouth daily.  3  . torsemide (DEMADEX) 20 MG tablet TAKE ONE TABLET BY MOUTH TWICE DAILY 60 tablet 2  . TOUJEO SOLOSTAR 300 UNIT/ML SOPN Inject 30 Units into the skin 2 (two) times daily.  6   No current facility-administered medications for this visit.    Allergies:  Patient has no known allergies.   Social History: The patient  reports that he quit smoking about 4 years ago. His smoking use included cigarettes. He started smoking about 35 years ago. He has a 45.00 pack-year smoking history. His smokeless tobacco use includes chew. He reports that he does not drink alcohol or use drugs.   ROS:  Please see the history of present illness. Otherwise, complete review of systems is positive for  none.  All other systems are reviewed and negative.   Physical Exam: VS:  BP (!) 144/81   Pulse 76   Ht 5\' 9"  (1.753 m)   Wt 294 lb (133.4 kg)   BMI 43.42 kg/m , BMI Body mass index is 43.42 kg/m.  Wt Readings from Last 3 Encounters:  07/18/17 294 lb (133.4 kg)  01/17/17 294 lb (133.4 kg)  07/20/16 (!) 303 lb (137.4 kg)    General: Obese male, appears comfortable at rest. HEENT: Conjunctiva and lids normal, oropharynx clear. Neck: Supple, no elevated JVP or carotid bruits, no thyromegaly. Lungs: Clear to auscultation, nonlabored breathing at rest. Cardiac: Regular rate and rhythm, no S3 or significant systolic murmur, no pericardial rub. Abdomen:  Obese, nontender, bowel sounds present, no guarding or rebound. Extremities: Trace ankle edema, distal pulses 2+. Skin: Warm and dry. Musculoskeletal: No kyphosis. Neuropsychiatric: Alert and oriented x3, affect grossly appropriate.  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  Recent Labwork:  April 2017: BUN 6, creatinine 0.77, potassium 3.7, AST 27, ALT 30, cholesterol 193, triglycerides 159, HDL 34, LDL 127, hemoglobin A1c 6.8  Other Studies Reviewed Today:  Echocardiogram 05/01/2013: Study Conclusions  - Left ventricle: The cavity size was at the upper limits of normal. There was mild concentric hypertrophy. Systolic function was normal. The estimated ejection fraction was in the range of 55% to 60%. There is hypokinesis of the basalinferolateral and inferoseptal myocardium. Features are consistent with a pseudonormal left ventricular filling pattern, with concomitant abnormal relaxation and increased filling pressure (grade 2 diastolic dysfunction). - Aortic valve: Probably trileaflet; mildly calcified leaflets. Trivial regurgitation. Mean gradient: 5mm Hg (S). Valve area: 3.57cm^2(VTI). - Aortic root: Descending aorta with increased gradient 16 mmHg - not welll seen, but question of mild narrowing/coartaction discussed previously. Could be further assessed by CTA in clinically significant. - Mitral valve: Trivial regurgitation. - Left atrium: The atrium was mildly dilated. - Right ventricle: The cavity size was moderately dilated. Systolic function was mildly reduced. - Right atrium: The atrium was moderately dilated. Central venous pressure: 8mm Hg (est). - Tricuspid valve: Trivial regurgitation. - Pulmonary arteries: PA peak pressure: 33mm Hg (S). - Pericardium, extracardiac: There was no pericardial effusion.  Impressions:  -  Comparison to prior study March 2011. Mild LVH with upper normal chamber size and LVEF 55-60%, mild hypokinesis of the basal inferolateral and inferoseptal wall. Grade 2 diastolic dysfunction. Mild left atrial and moderate right atrial enlargement. Mildly calcified aortic valve with trivial regurgitation seen intermittently. Moderate RV enlargement with mildly reduced contraction. Trivial tricuspid regurgitation with PASP 33 mmHg.  Assessment and Plan:  1.  Symptomatically stable CAD status post CABG in 2006.  He reports compliance with medical therapy.  Refill provided for fresh bottle of nitroglycerin.  He continues to prefer observation, hold off on stress testing.  2.  Hyperlipidemia, remains on Pravachol with follow-up per Dr. Leandrew KoyanagiBurdine.  3.  Tobacco abuse in remission.  4.  Obesity.  We discussed diet and weight loss.  Current medicines were reviewed with the patient today.   Orders Placed This Encounter  Procedures  . EKG 12-Lead    Disposition: Follow-up in 6 months.  Signed, Jonelle SidleSamuel G. Jarrius Huaracha, MD, [facility] 07/18/2017 11:16 AM    [facility] [address], [place], [place] [address] Phone: [phone]) [phone]; Fax: [phone]

## 2017-07-18 NOTE — Patient Instructions (Signed)

## 2018-05-24 ENCOUNTER — Encounter: Payer: Self-pay | Admitting: *Deleted

## 2018-05-26 NOTE — Progress Notes (Signed)
Cardiology Office Note  Date: 05/27/2018   ID: Cesar Cantrell, DOB Sep 28, 1965, MRN [id number]  PCP: Juliette Alcide, MD  Primary Cardiologist: Nona Dell, MD   Chief Complaint  Patient presents with  . Coronary Artery Disease    History of Present Illness: Cesar Cantrell is a 52 y.o. male last seen in December 2018.  He presents for a routine follow-up visit.  Reports no angina symptoms or nitroglycerin use.  No change in stamina or increasing shortness of breath with activities.  He denies palpitations or syncope.  He has preferred observation without follow-up stress testing over time.  This remains the case today.  He reports compliance with his medications including aspirin, Pravachol, and Demadex. Continues on diabetic regimen per Dr. Leandrew Koyanagi.  We are requesting his most recent lab work for follow-up of lipids.  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  Past Medical History:  Diagnosis Date  . Cor pulmonale (HCC)   . Coronary atherosclerosis of native coronary artery    Multivessel status post CABG  . Essential hypertension, benign   . Obesity hypoventilation syndrome (HCC)   . Pneumonia   . Pulmonary hypertension (HCC)    PASP [facility] mmHg at [facility] 01/2013 (down to 33 mmHg by subsequent echo 2014)  . Sleep apnea    On BIPAP  . Type 2 diabetes mellitus (HCC)     Past Surgical History:  Procedure Laterality Date  . CORONARY ARTERY BYPASS GRAFT  09-20-04   LIMA to LAD, left radial to D1, SVG to ramus and SVG to PDA/PLA  . RIGHT HEART CATHETERIZATION N/A 01/20/2013   Procedure: RIGHT HEART CATH;  Surgeon: Wendall Stade, MD;  Location: [facility] C[facility] LAB;  Service: Cardiovascular;  Laterality: N/A;    Current Outpatient Medications  Medication Sig Dispense Refill  . albuterol (PROVENTIL HFA;VENTOLIN HFA) 108 (90 BASE) MCG/ACT inhaler Inhale 2 puffs into the lungs every 6 (six) hours as needed for wheezing. 1  Inhaler 2  . aspirin EC 81 MG tablet Take 81 mg by mouth every morning.    Marland Kitchen glimepiride (AMARYL) 4 MG tablet Take 4 mg by mouth 2 (two) times daily with a meal.    . metFORMIN (GLUCOPHAGE) 1000 MG tablet Take 1 tablet by mouth 2 (two) times daily.    . nitroGLYCERIN (NITROSTAT) 0.4 MG SL tablet Place 1 tablet (0.4 mg total) under the tongue every 5 (five) minutes as needed for chest pain. 25 tablet 3  . Omega-3 Fatty Acids (FISH OIL) 1200 MG CAPS Take 1 capsule by mouth daily.     . pravastatin (PRAVACHOL) 80 MG tablet Take 1 tablet by mouth daily.  3  . torsemide (DEMADEX) 20 MG tablet TAKE ONE TABLET BY MOUTH TWICE DAILY 60 tablet 2  . TOUJEO SOLOSTAR 300 UNIT/ML SOPN Inject 30 Units into the skin 2 (two) times daily.  6   No current facility-administered medications for this visit.    Allergies:  Patient has no known allergies.   Social History: The patient  reports that he quit smoking about 5 years ago. His smoking use included cigarettes. He started smoking about 35 years ago. He has a 45.00 pack-year smoking history. His smokeless tobacco use includes chew. He reports that he does not drink alcohol or use drugs.   ROS:  Please see the history of present illness. Otherwise, complete review of systems is positive for none.  All other  systems are reviewed and negative.   Physical Exam: VS:  BP 138/84   Pulse 84   Ht 5\' 10"  (1.778 m)   Wt 288 lb 6.4 oz (130.8 kg)   SpO2 92%   BMI 41.38 kg/m , BMI Body mass index is 41.38 kg/m.  Wt Readings from Last 3 Encounters:  05/27/18 288 lb 6.4 oz (130.8 kg)  07/18/17 294 lb (133.4 kg)  01/17/17 294 lb (133.4 kg)    General: Obese male, appears comfortable at rest. HEENT: Conjunctiva and lids normal, oropharynx clear. Neck: Supple, no elevated JVP or carotid bruits, no thyromegaly. Lungs: Clear to auscultation, nonlabored breathing at rest. Cardiac: Regular rate and rhythm, no S3 or significant systolic murmur. Abdomen: Soft,  nontender, bowel sounds present. Extremities: Trace ankle edema, distal pulses 2+. Skin: Warm and dry. Musculoskeletal: No kyphosis. Neuropsychiatric: Alert and oriented x3, affect grossly appropriate.  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  Recent Labwork:  April 2017: BUN 6, creatinine 0.77, potassium 3.7, AST 27, ALT 30, cholesterol 193, triglycerides 159, HDL 34, LDL 127, hemoglobin A1c 6.8  Other Studies Reviewed Today:  Echocardiogram 05/01/2013: Study Conclusions  - Left ventricle: The cavity size was at the upper limits of normal. There was mild concentric hypertrophy. Systolic function was normal. The estimated ejection fraction was in the range of 55% to 60%. There is hypokinesis of the basalinferolateral and inferoseptal myocardium. Features are consistent with a pseudonormal left ventricular filling pattern, with concomitant abnormal relaxation and increased filling pressure (grade 2 diastolic dysfunction). - Aortic valve: Probably trileaflet; mildly calcified leaflets. Trivial regurgitation. Mean gradient: 5mm Hg (S). Valve area: 3.57cm^2(VTI). - Aortic root: Descending aorta with increased gradient 16 mmHg - not welll seen, but question of mild narrowing/coartaction discussed previously. Could be further assessed by CTA in clinically significant. - Mitral valve: Trivial regurgitation. - Left atrium: The atrium was mildly dilated. - Right ventricle: The cavity size was moderately dilated. Systolic function was mildly reduced. - Right atrium: The atrium was moderately dilated. Central venous pressure: 8mm Hg (est). - Tricuspid valve: Trivial regurgitation. - Pulmonary arteries: PA peak pressure: 33mm Hg (S). - Pericardium, extracardiac: There was no pericardial effusion.  Impressions:  - Comparison to prior study March  2011. Mild LVH with upper normal chamber size and LVEF 55-60%, mild hypokinesis of the basal inferolateral and inferoseptal wall. Grade 2 diastolic dysfunction. Mild left atrial and moderate right atrial enlargement. Mildly calcified aortic valve with trivial regurgitation seen intermittently. Moderate RV enlargement with mildly reduced contraction. Trivial tricuspid regurgitation with PASP 33 mmHg.  Assessment and Plan:  1.  CAD status post CABG in 2006.  ECG reviewed and stable.  He reports no angina symptoms or nitroglycerin requirement at this time.  He also continues to prefer observation rather than follow-up ischemic and structural cardiac testing.  We have discussed warning signs and symptoms.  Continue aspirin and statin, nitroglycerin available.  2.  Mixed hyperlipidemia, on Pravachol 80 mg daily.  Requesting recent lab work from Dr. Leandrew Koyanagi.  3.  Morbid obesity.  Weight loss and diet have been discussed.  4.  Type 2 diabetes mellitus, on Amaryl and Glucophage per Dr. Leandrew Koyanagi.  Current medicines were reviewed with the patient today.   Orders Placed This Encounter  Procedures  . EKG 12-Lead    Disposition: Follow-up in 6 months.  Signed, Jonelle Sidle, MD, [facility] 05/27/2018 8:52 AM  [facility] [place], [place], [place] [address] Phone: [phone]; Fax: [phone]

## 2018-05-27 ENCOUNTER — Encounter: Payer: Self-pay | Admitting: *Deleted

## 2018-05-27 ENCOUNTER — Encounter: Payer: Self-pay | Admitting: Cardiology

## 2018-05-27 ENCOUNTER — Ambulatory Visit (INDEPENDENT_AMBULATORY_CARE_PROVIDER_SITE_OTHER): Payer: Self-pay | Admitting: Cardiology

## 2018-05-27 VITALS — BP 138/84 | HR 84 | Ht 70.0 in | Wt 288.4 lb

## 2018-05-27 DIAGNOSIS — I25119 Atherosclerotic heart disease of native coronary artery with unspecified angina pectoris: Secondary | ICD-10-CM

## 2018-05-27 DIAGNOSIS — E119 Type 2 diabetes mellitus without complications: Secondary | ICD-10-CM

## 2018-05-27 DIAGNOSIS — E782 Mixed hyperlipidemia: Secondary | ICD-10-CM

## 2018-05-27 NOTE — Patient Instructions (Addendum)
Medication Instructions:   Your physician recommends that you continue on your current medications as directed. Please refer to the Current Medication list given to you today.  Labwork:  NONE-most recent lab work requested from your family doctor- we will contact you if your doctor wants to change any of medications.  Testing/Procedures:  NONE  Follow-Up:  Your physician recommends that you schedule a follow-up appointment in: 6 months. You will receive a reminder letter in the mail in about 4 months reminding you to call and schedule your appointment. If you don't receive this letter, please contact our office.  Any Other Special Instructions Will Be Listed Below (If Applicable).  If you need a refill on your cardiac medications before your next appointment, please call your pharmacy.

## 2018-05-31 ENCOUNTER — Telehealth: Payer: Self-pay | Admitting: *Deleted

## 2018-05-31 DIAGNOSIS — I2510 Atherosclerotic heart disease of native coronary artery without angina pectoris: Secondary | ICD-10-CM

## 2018-05-31 MED ORDER — ROSUVASTATIN CALCIUM 10 MG PO TABS: 10.0000 mg | ORAL_TABLET | Freq: Every day | ORAL | 1 refills | Status: DC

## 2018-05-31 NOTE — Telephone Encounter (Signed)
-----   Message from Jonelle Sidle, MD sent at 05/28/2018  8:26 AM EDT ----- Results reviewed.  Please let him know that I did review his lipid panel done recently.  LDL cholesterol was 101, he is on Pravachol 80 mg daily.  I did talk with him at recent visit about possibly considering a different statin if his LDL was not at goal.  He does not report any previous intolerances.  If he is in agreement, would consider stopping Pravachol and start Crestor 10 mg daily.  He would then need a follow-up FLP and LFTs in 8 weeks. A copy of this test should be forwarded to Burdine, Ananias Pilgrim, MD.

## 2018-05-31 NOTE — Telephone Encounter (Signed)
Pt agreeable to Crestor 10 mg daily - will mail lab orders - routed to pcp

## 2018-11-19 ENCOUNTER — Telehealth: Payer: Self-pay | Admitting: *Deleted

## 2018-11-19 NOTE — Telephone Encounter (Signed)
Pt verbalized consent of telehealth appt with Dr Diona Browner on 11/25/18 and that insurance will be billed for the encounter. Medications/allergies/pharmacy reviewed. Pt will have BP/HR/weight.

## 2018-11-22 NOTE — Progress Notes (Signed)
Virtual Visit via Telephone Note   This visit type was conducted due to national recommendations for restrictions regarding the COVID-19 Pandemic (e.g. social distancing) in an effort to limit this patient's exposure and mitigate transmission in our community.  Due to his co-morbid illnesses, this patient is at least at moderate risk for complications without adequate follow up.  This format is felt to be most appropriate for this patient at this time.  The patient did not have access to video technology/had technical difficulties with video requiring transitioning to audio format only (telephone).  All issues noted in this document were discussed and addressed.  No physical exam could be performed with this format.  Please refer to the patient's chart for his  consent to telehealth for [facility].   Evaluation Performed:  Follow-up visit  Date:  11/25/2018   ID:  Cesar Cantrell, DOB 29-Jan-1966, MRN [id number]  Patient Location: Home Provider Location: Office  PCP:  Juliette AlcideBurdine, Steven E, MD  Cardiologist:  Nona DellSamuel Cuma Polyakov, MD  Chief Complaint: Follow-up CAD and symptom control  History of Present Illness:    Cesar BranchKelly D Lister is a 53 y.o. male last seen in October 2019.  He did not have video access and we spoke by phone today.  He states that he has had no angina symptoms or required nitroglycerin in the last 6 months.  He reports NYHA class II dyspnea with baseline activities, no palpitations or syncope.  He has preferred observation without follow-up stress testing over time.  We reviewed his medications which are listed below.  He reports no intolerances. Since last visit he was switched from Pravachol to Crestor, last lipid panel detailed below.  He states that he had follow-up lab work with [facility] done recently which we will request for review.  The patient does not have symptoms concerning for COVID-19 infection (fever, chills, cough, or new shortness of breath).  He has been  staying around the house, goes out to get groceries.   Past Medical History:  Diagnosis Date  . Cor pulmonale (HCC)   . Coronary atherosclerosis of native coronary artery    Multivessel status post CABG  . Essential hypertension, benign   . Obesity hypoventilation syndrome (HCC)   . Pneumonia   . Pulmonary hypertension (HCC)    [facility] (down to 33 mmHg by subsequent echo 2014)  . Sleep apnea    On BIPAP  . Type 2 diabetes mellitus (HCC)    Past Surgical History:  Procedure Laterality Date  . CORONARY ARTERY BYPASS GRAFT  09-20-04   LIMA to LAD, left radial to D1, SVG to ramus and SVG to PDA/PLA  . RIGHT HEART CATHETERIZATION N/A 01/20/2013   Procedure: RIGHT HEART CATH;  Surgeon: Wendall StadePeter C Nishan, MD;  [facility];  Service: Cardiovascular;  Laterality: N/A;     Current Meds  Medication Sig  . albuterol (PROVENTIL HFA;VENTOLIN HFA) 108 (90 BASE) MCG/ACT inhaler Inhale 2 puffs into the lungs every 6 (six) hours as needed for wheezing.  Marland Kitchen. aspirin EC 81 MG tablet Take 81 mg by mouth every morning.  Marland Kitchen. glimepiride (AMARYL) 4 MG tablet Take 4 mg by mouth 2 (two) times daily with a meal.  . metFORMIN (GLUCOPHAGE) 1000 MG tablet Take 1 tablet by mouth 2 (two) times daily.  . nitroGLYCERIN (NITROSTAT) 0.4 MG SL tablet Place 1 tablet (0.4 mg total) under the tongue every 5 (five) minutes as needed for chest pain.  .Marland Kitchen  Omega-3 Fatty Acids (FISH OIL) 1200 MG CAPS Take 1 capsule by mouth daily.   . rosuvastatin (CRESTOR) 10 MG tablet Take 1 tablet (10 mg total) by mouth daily.  Marland Kitchen torsemide (DEMADEX) 20 MG tablet TAKE ONE TABLET BY MOUTH TWICE DAILY  . TOUJEO SOLOSTAR 300 UNIT/ML SOPN Inject 30 Units into the skin 2 (two) times daily.     Allergies:   Patient has no known allergies.   Social History   Tobacco Use  . Smoking status: Former Smoker    Packs/day: 1.50    Years: 30.00    Pack years: 45.00    Types: Cigarettes    Start date: 06/21/1982    Last  attempt to quit: 01/20/2013    Years since quitting: 5.8  . Smokeless tobacco: Current User    Types: Chew  . Tobacco comment: chew sometimes; "it's not a habit"   Substance Use Topics  . Alcohol use: No    Alcohol/week: 0.0 standard drinks  . Drug use: No     Family Hx: The patient's family history includes Coronary artery disease in his unknown relative; Heart disease (age of onset: 80) in an other family member.  ROS:   Please see the history of present illness.    All other systems reviewed and are negative.   Prior CV studies:   The following studies were reviewed today:  Echocardiogram 05/01/2013: Study Conclusions  - Left ventricle: The cavity size was at the upper limits of normal. There was mild concentric hypertrophy. Systolic function was normal. The estimated ejection fraction was in the range of 55% to 60%. There is hypokinesis of the basalinferolateral and inferoseptal myocardium. Features are consistent with a pseudonormal left ventricular filling pattern, with concomitant abnormal relaxation and increased filling pressure (grade 2 diastolic dysfunction). - Aortic valve: Probably trileaflet; mildly calcified leaflets. Trivial regurgitation. Mean gradient: 40mm Hg (S). Valve area: 3.57cm^2(VTI). - Aortic root: Descending aorta with increased gradient 16 mmHg - not welll seen, but question of mild narrowing/coartaction discussed previously. Could be further assessed by CTA in clinically significant. - Mitral valve: Trivial regurgitation. - Left atrium: The atrium was mildly dilated. - Right ventricle: The cavity size was moderately dilated. Systolic function was mildly reduced. - Right atrium: The atrium was moderately dilated. Central venous pressure: 47mm Hg (est). - Tricuspid valve: Trivial regurgitation. - Pulmonary arteries: PA peak pressure: 64mm Hg (S). - Pericardium, extracardiac: There was no pericardial effusion.   Impressions:  - Comparison to prior study March 2011. Mild LVH with upper normal chamber size and LVEF 55-60%, mild hypokinesis of the basal inferolateral and inferoseptal wall. Grade 2 diastolic dysfunction. Mild left atrial and moderate right atrial enlargement. Mildly calcified aortic valve with trivial regurgitation seen intermittently. Moderate RV enlargement with mildly reduced contraction. Trivial tricuspid regurgitation with PASP 33 mmHg.  Labs/Other Tests and Data Reviewed:    EKG:  I personally reviewed the tracing from 05/27/2018 which shows sinus rhythm with IVCD and old inferior infarct pattern.  Recent Labs:  October 2019: Cholesterol 176, triglycerides 179, HDL 39, LDL 101, hemoglobin 11.8, platelets 230, hemoglobin A1c 6.6%  Wt Readings from Last 3 Encounters:  11/25/18 285 lb (129.3 kg)  05/27/18 288 lb 6.4 oz (130.8 kg)  07/18/17 294 lb (133.4 kg)     Objective:    Vital Signs:  BP 120/82   Pulse 82   Ht 5\' 9"  (1.753 m)   Wt 285 lb (129.3 kg)   BMI 42.09 kg/m  Patient answered questions spontaneously on the phone.  Voice tone and speech pattern normal.  He was not breathless speaking in full sentences.  ASSESSMENT & PLAN:    1.  Multivessel CAD status post CABG in 2006.  Patient reports no active angina symptoms on medical therapy and prefers observation.  He has declined ischemic testing over time.  Continue aspirin and statin therapy. Will follow-up with ECG for next visit.  2.  Mixed hyperlipidemia, tolerating Crestor.  Plan to obtain recent lab work from [facility].  3.  Type 2 diabetes mellitus,, on Glucophage and Amaryl.  He follows with Dr. Leandrew Koyanagi.  Requesting most recent lab work for review.  His last hemoglobin A1c was 6.6%.  COVID-19 Education: The signs and symptoms of COVID-19 were discussed with the patient and how to seek care for testing (follow up with PCP or arrange E-visit).  The importance of social distancing was  discussed today.  Time:   Today, I have spent 6 minutes with the patient with telehealth technology discussing the above problems.     Medication Adjustments/Labs and Tests Ordered: Current medicines are reviewed at length with the patient today.  Concerns regarding medicines are outlined above.   Tests Ordered: No orders of the defined types were placed in this encounter.   Medication Changes: No orders of the defined types were placed in this encounter.   Disposition:  Follow up 6 months  Signed, Nona Dell, MD  11/25/2018 8:37 AM    [facility]

## 2018-11-25 ENCOUNTER — Encounter: Payer: Self-pay | Admitting: *Deleted

## 2018-11-25 ENCOUNTER — Encounter: Payer: Self-pay | Admitting: Cardiology

## 2018-11-25 ENCOUNTER — Encounter: Payer: Self-pay | Admitting: Family Medicine

## 2018-11-25 ENCOUNTER — Telehealth (INDEPENDENT_AMBULATORY_CARE_PROVIDER_SITE_OTHER): Payer: Self-pay | Admitting: Cardiology

## 2018-11-25 VITALS — BP 120/82 | HR 82 | Ht 69.0 in | Wt 285.0 lb

## 2018-11-25 DIAGNOSIS — I25119 Atherosclerotic heart disease of native coronary artery with unspecified angina pectoris: Secondary | ICD-10-CM

## 2018-11-25 DIAGNOSIS — E782 Mixed hyperlipidemia: Secondary | ICD-10-CM

## 2018-11-25 DIAGNOSIS — E119 Type 2 diabetes mellitus without complications: Secondary | ICD-10-CM

## 2018-11-25 DIAGNOSIS — Z7189 Other specified counseling: Secondary | ICD-10-CM

## 2018-11-25 DIAGNOSIS — Z951 Presence of aortocoronary bypass graft: Secondary | ICD-10-CM

## 2018-11-25 NOTE — Patient Instructions (Addendum)

## 2018-12-02 ENCOUNTER — Telehealth: Payer: Self-pay | Admitting: Cardiology

## 2018-12-02 MED ORDER — ROSUVASTATIN CALCIUM 10 MG PO TABS: 10.0000 mg | ORAL_TABLET | Freq: Every day | ORAL | 2 refills | Status: DC

## 2018-12-02 NOTE — Telephone Encounter (Signed)
°*  STAT* If patient is at the pharmacy, call can be transferred to refill team.   1. Which medications need to be refilled? rosuvastatin (CRESTOR) 10 MG tablet    2. Which pharmacy/location (including street and city if local pharmacy) is medication to be sent to? [facility]  3. Do they need a 30 day or 90 day supply?

## 2018-12-05 ENCOUNTER — Other Ambulatory Visit: Payer: Self-pay | Admitting: Cardiology

## 2019-05-29 ENCOUNTER — Telehealth: Payer: Self-pay | Admitting: Cardiology

## 2019-05-29 NOTE — Telephone Encounter (Signed)
Virtual Visit Pre-Appointment Phone Call  "(Name), I am calling you today to discuss your upcoming appointment. We are currently trying to limit exposure to the virus that causes COVID-19 by seeing patients at home rather than in the office."  1. "What is the BEST phone number to call the day of the visit?" - include this in appointment notes  2. Do you have or have access to (through a family member/friend) a smartphone with video capability that we can use for your visit?" a. If yes - list this number in appt notes as cell (if different from BEST phone #) and list the appointment type as a VIDEO visit in appointment notes b. If no - list the appointment type as a PHONE visit in appointment notes  3. Confirm consent - "In the setting of the current Covid19 crisis, you are scheduled for a (phone or video) visit with your provider on (date) at (time).  Just as we do with many in-office visits, in order for you to participate in this visit, we must obtain consent.  If you'd like, I can send this to your mychart (if signed up) or email for you to review.  Otherwise, I can obtain your verbal consent now.  All virtual visits are billed to your insurance company just like a normal visit would be.  By agreeing to a virtual visit, we'd like you to understand that the technology does not allow for your provider to perform an examination, and thus may limit your provider's ability to fully assess your condition. If your provider identifies any concerns that need to be evaluated in person, we will make arrangements to do so.  Finally, though the technology is pretty good, we cannot assure that it will always work on either your or our end, and in the setting of a video visit, we may have to convert it to a phone-only visit.  In either situation, we cannot ensure that we have a secure connection.  Are you willing to proceed?" STAFF: Did the patient verbally acknowledge consent to telehealth visit? Document  YES/NO here: yes  4. Advise patient to be prepared - "Two hours prior to your appointment, go ahead and check your blood pressure, pulse, oxygen saturation, and your weight (if you have the equipment to check those) and write them all down. When your visit starts, your provider will ask you for this information. If you have an Apple Watch or Kardia device, please plan to have heart rate information ready on the day of your appointment. Please have a pen and paper handy nearby the day of the visit as well."  5. Give patient instructions for MyChart download to smartphone OR Doximity/Doxy.me as below if video visit (depending on what platform provider is using)  6. Inform patient they will receive a phone call 15 minutes prior to their appointment time (may be from unknown caller ID) so they should be prepared to answer    TELEPHONE CALL NOTE  Cesar Cantrell has been deemed a candidate for a follow-up tele-health visit to limit community exposure during the Covid-19 pandemic. I spoke with the patient via phone to ensure availability of phone/video source, confirm preferred email & phone number, and discuss instructions and expectations.  I reminded Cesar Cantrell to be prepared with any vital sign and/or heart rhythm information that could potentially be obtained via home monitoring, at the time of his visit. I reminded Cesar Cantrell to expect a phone call prior to  his visit.  [name] 05/29/2019 4:22 PM   INSTRUCTIONS FOR DOWNLOADING THE MYCHART APP TO SMARTPHONE  - The patient must first make sure to have activated MyChart and know their login information - If Apple, go to [employer] and type in MyChart in the search bar and download the app. If Android, ask patient to go to [employer] and type in [place] in the search bar and download the app. The app is free but as with any other app downloads, their phone may require them to verify saved payment information or Apple/Android  password.  - The patient will need to then log into the app with their MyChart username and password, and select  as their healthcare provider to link the account. When it is time for your visit, go to the MyChart app, find appointments, and click Begin Video Visit. Be sure to Select Allow for your device to access the Microphone and Camera for your visit. You will then be connected, and your provider will be with you shortly.  **If they have any issues connecting, or need assistance please contact MyChart service desk [phone])**  **If using a computer, in order to ensure the best quality for their visit they will need to use either of the following Internet Browsers: [employer] or Google Chrome**  IF USING DOXIMITY or DOXY.ME - The patient will receive a link just prior to their visit by text.     FULL LENGTH CONSENT FOR TELE-HEALTH VISIT   I hereby voluntarily request, consent and authorize [facility] and its employed or contracted physicians, physician assistants, nurse practitioners or other licensed health care professionals (the Practitioner), to provide me with telemedicine health care services (the Services") as deemed necessary by the treating Practitioner. I acknowledge and consent to receive the Services by the Practitioner via telemedicine. I understand that the telemedicine visit will involve communicating with the Practitioner through live audiovisual communication technology and the disclosure of certain medical information by electronic transmission. I acknowledge that I have been given the opportunity to request an in-person assessment or other available alternative prior to the telemedicine visit and am voluntarily participating in the telemedicine visit.  I understand that I have the right to withhold or withdraw my consent to the use of telemedicine in the course of my care at any time, without affecting my right to future care or treatment,  and that the Practitioner or I may terminate the telemedicine visit at any time. I understand that I have the right to inspect all information obtained and/or recorded in the course of the telemedicine visit and may receive copies of available information for a reasonable fee.  I understand that some of the potential risks of receiving the Services via telemedicine include:   Delay or interruption in medical evaluation due to technological equipment failure or disruption;  Information transmitted may not be sufficient (e.g. poor resolution of images) to allow for appropriate medical decision making by the Practitioner; and/or   In rare instances, security protocols could fail, causing a breach of personal health information.  Furthermore, I acknowledge that it is my responsibility to provide information about my medical history, conditions and care that is complete and accurate to the best of my ability. I acknowledge that Practitioner's advice, recommendations, and/or decision may be based on factors not within their control, such as incomplete or inaccurate data provided by me or distortions of diagnostic images or specimens that may result from electronic transmissions. I  understand that the practice of medicine is not an exact science and that Practitioner makes no warranties or guarantees regarding treatment outcomes. I acknowledge that I will receive a copy of this consent concurrently upon execution via email to the email address I last provided but may also request a printed copy by calling the office of [place].    I understand that my insurance will be billed for this visit.   I have read or had this consent read to me.  I understand the contents of this consent, which adequately explains the benefits and risks of the Services being provided via telemedicine.   I have been provided ample opportunity to ask questions regarding this consent and the Services and have had my questions  answered to my satisfaction.  I give my informed consent for the services to be provided through the use of telemedicine in my medical care  By participating in this telemedicine visit I agree to the above.

## 2019-06-03 ENCOUNTER — Encounter: Payer: Self-pay | Admitting: *Deleted

## 2019-06-04 ENCOUNTER — Encounter: Payer: Self-pay | Admitting: Cardiology

## 2019-06-04 ENCOUNTER — Telehealth (INDEPENDENT_AMBULATORY_CARE_PROVIDER_SITE_OTHER): Payer: Medicaid - Out of State | Admitting: Cardiology

## 2019-06-04 VITALS — BP 117/82 | HR 86 | Ht 69.0 in | Wt 286.0 lb

## 2019-06-04 DIAGNOSIS — E782 Mixed hyperlipidemia: Secondary | ICD-10-CM

## 2019-06-04 DIAGNOSIS — Z79899 Other long term (current) drug therapy: Secondary | ICD-10-CM

## 2019-06-04 DIAGNOSIS — I25119 Atherosclerotic heart disease of native coronary artery with unspecified angina pectoris: Secondary | ICD-10-CM | POA: Diagnosis not present

## 2019-06-04 DIAGNOSIS — Z951 Presence of aortocoronary bypass graft: Secondary | ICD-10-CM | POA: Diagnosis not present

## 2019-06-04 NOTE — Progress Notes (Signed)
Virtual Visit via Telephone Note   This visit type was conducted due to national recommendations for restrictions regarding the COVID-19 Pandemic (e.g. social distancing) in an effort to limit this patient's exposure and mitigate transmission in our community.  Due to his co-morbid illnesses, this patient is at least at moderate risk for complications without adequate follow up.  This format is felt to be most appropriate for this patient at this time.  The patient did not have access to video technology/had technical difficulties with video requiring transitioning to audio format only (telephone).  All issues noted in this document were discussed and addressed.  No physical exam could be performed with this format.  Please refer to the patient's chart for his  consent to telehealth for [facility].  Date:  06/04/2019   ID:  Colon Branch, DOB 1966/03/06, MRN [id number]  Patient Location: Home Provider Location: Office  PCP:  Juliette Alcide, MD  Cardiologist:  Nona Dell, MD Electrophysiologist:  None   Evaluation Performed:  Follow-Up Visit  Chief Complaint:   Cardiac follow-up  History of Present Illness:    Cesar Cantrell is a 53 y.o. male last assessed via telehealth encounter in April.  We spoke by phone today.  He tells me that he has been doing well overall, used only a single nitroglycerin since last assessment.  He reports compliance with his medications which are outlined below.  He has also had interval follow-up with Dr. Leandrew Koyanagi.  He indicated that his stamina has not changed, still functional with ADLs including yard work.  The patient does not have symptoms concerning for COVID-19 infection (fever, chills, cough, or new shortness of breath).    Past Medical History:  Diagnosis Date  . Cor pulmonale (HCC)   . Coronary atherosclerosis of native coronary artery    Multivessel status post CABG  . Essential hypertension, benign   . Obesity hypoventilation  syndrome (HCC)   . Pneumonia   . Pulmonary hypertension (HCC)    PASP 80 mmHg at [facility] 01/2013 (down to 33 mmHg by subsequent echo 2014)  . Sleep apnea    On BIPAP  . Type 2 diabetes mellitus (HCC)    Past Surgical History:  Procedure Laterality Date  . CORONARY ARTERY BYPASS GRAFT  09-20-04   LIMA to LAD, left radial to D1, SVG to ramus and SVG to PDA/PLA  . RIGHT HEART CATHETERIZATION N/A 01/20/2013   Procedure: RIGHT HEART CATH;  Surgeon: Wendall Stade, MD;  Location: [facility] CATH LAB;  Service: Cardiovascular;  Laterality: N/A;     Current Meds  Medication Sig  . aspirin EC 81 MG tablet Take 81 mg by mouth every morning.  Marland Kitchen glimepiride (AMARYL) 4 MG tablet Take 4 mg by mouth 2 (two) times daily with a meal.  . lisinopril (ZESTRIL) 10 MG tablet Take 10 mg by mouth daily.  . metFORMIN (GLUCOPHAGE) 1000 MG tablet Take 1 tablet by mouth 2 (two) times daily.  . nitroGLYCERIN (NITROSTAT) 0.4 MG SL tablet Place 1 tablet (0.4 mg total) under the tongue every 5 (five) minutes as needed for chest pain.  . Omega-3 Fatty Acids (FISH OIL) 1200 MG CAPS Take 1 capsule by mouth daily.   . rosuvastatin (CRESTOR) 10 MG tablet Take 1 tablet (10 mg total) by mouth daily.  Marland Kitchen torsemide (DEMADEX) 20 MG tablet TAKE ONE TABLET BY MOUTH TWICE DAILY  . TOUJEO SOLOSTAR 300 UNIT/ML SOPN Inject 30 Units into the skin 2 (two) times daily.  Allergies:   Patient has no known allergies.   Social History   Tobacco Use  . Smoking status: Former Smoker    Packs/day: 1.50    Years: 30.00    Pack years: 45.00    Types: Cigarettes    Start date: 06/21/1982    Quit date: 01/20/2013    Years since quitting: 6.3  . Smokeless tobacco: Current User    Types: Chew  . Tobacco comment: chew sometimes; "it's not a habit"   Substance Use Topics  . Alcohol use: No    Alcohol/week: 0.0 standard drinks  . Drug use: No     Family Hx: The patient's family history includes Coronary artery disease in an other family member;  Heart disease (age of onset: 58) in an other family member.  ROS:   Please see the history of present illness. All other systems reviewed and are negative.   Prior CV studies:   The following studies were reviewed today:  Echocardiogram 05/01/2013: Study Conclusions  - Left ventricle: The cavity size was at the upper limits of normal. There was mild concentric hypertrophy. Systolic function was normal. The estimated ejection fraction was in the range of 55% to 60%. There is hypokinesis of the basalinferolateral and inferoseptal myocardium. Features are consistent with a pseudonormal left ventricular filling pattern, with concomitant abnormal relaxation and increased filling pressure (grade 2 diastolic dysfunction). - Aortic valve: Probably trileaflet; mildly calcified leaflets. Trivial regurgitation. Mean gradient: 56mm Hg (S). Valve area: 3.57cm^2(VTI). - Aortic root: Descending aorta with increased gradient 16 mmHg - not welll seen, but question of mild narrowing/coartaction discussed previously. Could be further assessed by CTA in clinically significant. - Mitral valve: Trivial regurgitation. - Left atrium: The atrium was mildly dilated. - Right ventricle: The cavity size was moderately dilated. Systolic function was mildly reduced. - Right atrium: The atrium was moderately dilated. Central venous pressure: 70mm Hg (est). - Tricuspid valve: Trivial regurgitation. - Pulmonary arteries: PA peak pressure: 28mm Hg (S). - Pericardium, extracardiac: There was no pericardial effusion.  Impressions:  - Comparison to prior study March 2011. Mild LVH with upper normal chamber size and LVEF 55-60%, mild hypokinesis of the basal inferolateral and inferoseptal wall. Grade 2 diastolic dysfunction. Mild left atrial and moderate right atrial enlargement. Mildly calcified aortic valve with trivial regurgitation seen intermittently. Moderate RV  enlargement with mildly reduced contraction. Trivial tricuspid regurgitation with PASP 33 mmHg.  Labs/Other Tests and Data Reviewed:    EKG:  An ECG dated 05/27/2018 was personally reviewed today and demonstrated:  Sinus rhythm with IVCD and old inferior infarct pattern.  Recent Labs:  March 2020: Hemoglobin A1c 7%, BUN 10, creatinine 0.89, potassium 4.3, AST 21, ALT 24  Wt Readings from Last 3 Encounters:  06/04/19 286 lb (129.7 kg)  11/25/18 285 lb (129.3 kg)  05/27/18 288 lb 6.4 oz (130.8 kg)     Objective:    Vital Signs:  BP 117/82   Pulse 86   Ht 5\' 9"  (1.753 m)   Wt 286 lb (129.7 kg)   BMI 42.23 kg/m    Patient spoke in full sentences, not short of breath. No audible wheezing or coughing. Speech pattern normal.  ASSESSMENT & PLAN:    1.  CAD status post CABG in 2006.  He does not report any progressive angina symptoms on medical therapy.  Over time he has preferred observation rather than follow-up ischemic testing and we will continue with this strategy in the absence of worsening symptomatology.  We will plan to see him back in the next 6 months.  2.  Mixed hyperlipidemia, he remains on Pravachol.  COVID-19 Education: The signs and symptoms of COVID-19 were discussed with the patient and how to seek care for testing (follow up with PCP or arrange E-visit).  The importance of social distancing was discussed today.  Time:   Today, I have spent 4 minutes with the patient with telehealth technology discussing the above problems.     Medication Adjustments/Labs and Tests Ordered: Current medicines are reviewed at length with the patient today.  Concerns regarding medicines are outlined above.   Tests Ordered: No orders of the defined types were placed in this encounter.   Medication Changes: No orders of the defined types were placed in this encounter.   Follow Up:  In Person 6 months in the [place] office.  Signed, Nona DellSamuel McDowell, MD  06/04/2019 3:50  PM    [facility]

## 2019-06-04 NOTE — Patient Instructions (Addendum)

## 2019-08-29 ENCOUNTER — Other Ambulatory Visit: Payer: Self-pay | Admitting: Cardiology

## 2019-12-21 NOTE — Progress Notes (Addendum)
Cardiology Office Note  Date: 12/22/2019   ID: Cesar Cantrell, DOB 1965/09/14, MRN [id number]  PCP:  Juliette Alcide, MD  Cardiologist:  Nona Dell, MD Electrophysiologist:  None   Chief Complaint: F/U CAD CABG 2006, HLD  History of Present Illness: Cesar Cantrell is a 54 y.o. male with a history of CAD status post CABG 2006, HLD, HTN, OSA, Obesity hypoventilation syndrome, pulmonary HTN.  Last seen by Dr. Diona Browner 06/04/2019.  He has been doing well overall and has used only a single nitroglycerin since his last assessment.  He was compliant with his medication he was still functional with his ADLs including yard work.  His CAD was asymptomatic.  He remained on Pravachol for his hyperlipidemia.   Patient states he has been doing well since last visit.  He denies any recent acute illnesses, hospitalizations.  States he has not had the Covid vaccine and does not plan to receive it.  He denies any progressive anginal or exertional symptoms, palpitations or arrhythmias, CVA or TIA-like symptoms, PND or orthopnea.  He is not compliant with his BiPAP therapy.  He denies any claudication-like symptoms, DVT or PE-like symptoms, or lower extremity edema.  He has gained weight.  Today he weighs 303 compared to previous weight at last visit of 286.  Past Medical History:  Diagnosis Date  . Cor pulmonale (HCC)   . Coronary atherosclerosis of native coronary artery    Multivessel status post CABG  . Essential hypertension, benign   . Obesity hypoventilation syndrome (HCC)   . Pneumonia   . Pulmonary hypertension (HCC)    PASP 80 mmHg at [facility] 01/2013 (down to 33 mmHg by subsequent echo 2014)  . Sleep apnea    On BIPAP  . Type 2 diabetes mellitus (HCC)     Past Surgical History:  Procedure Laterality Date  . CORONARY ARTERY BYPASS GRAFT  09-20-04   LIMA to LAD, left radial to D1, SVG to ramus and SVG to PDA/PLA  . RIGHT HEART CATHETERIZATION N/A 01/20/2013   Procedure: RIGHT HEART  CATH;  Surgeon: Wendall Stade, MD;  Location: [facility] CATH LAB;  Service: Cardiovascular;  Laterality: N/A;    Current Outpatient Medications  Medication Sig Dispense Refill  . aspirin EC 81 MG tablet Take 81 mg by mouth every morning.    Marland Kitchen glimepiride (AMARYL) 4 MG tablet Take 4 mg by mouth 2 (two) times daily with a meal.    . lisinopril (ZESTRIL) 10 MG tablet Take 10 mg by mouth daily.    . metFORMIN (GLUCOPHAGE) 1000 MG tablet Take 1 tablet by mouth 2 (two) times daily.    . nitroGLYCERIN (NITROSTAT) 0.4 MG SL tablet Place 1 tablet (0.4 mg total) under the tongue every 5 (five) minutes as needed for chest pain. 25 tablet 3  . Omega-3 Fatty Acids (FISH OIL) 1200 MG CAPS Take 1 capsule by mouth daily.     . rosuvastatin (CRESTOR) 10 MG tablet TAKE ONE TABLET BY MOUTH DAILY 90 tablet 1  . torsemide (DEMADEX) 20 MG tablet TAKE ONE TABLET BY MOUTH TWICE DAILY 60 tablet 2  . TOUJEO SOLOSTAR 300 UNIT/ML SOPN Inject 30 Units into the skin 2 (two) times daily.  6   No current facility-administered medications for this visit.   Allergies:  Patient has no known allergies.   Social History: The patient  reports that he quit smoking about 6 years ago. His smoking use included cigarettes. He started smoking about 37 years  ago. He has a 45.00 pack-year smoking history. His smokeless tobacco use includes chew. He reports that he does not drink alcohol or use drugs.  13-96 Family History: The patient's family history includes Coronary artery disease in an other family member; Heart disease (age of onset: 70) in an other family member.   ROS:  Please see the history of present illness. Otherwise, complete review of systems is positive for none.  All other systems are reviewed and negative.   Physical Exam: VS:  BP (!) 158/72   Pulse 88   Ht 5\' 9"  (1.753 m)   Wt (!) 303 lb (137.4 kg)   SpO2 98%   BMI 44.75 kg/m , BMI Body mass index is 44.75 kg/m.  Wt Readings from Last 3 Encounters:  12/22/19 (!)  303 lb (137.4 kg)  06/04/19 286 lb (129.7 kg)  11/25/18 285 lb (129.3 kg)    General: Obese patient appears comfortable at rest. Neck: Supple, no elevated JVP or carotid bruits, no thyromegaly. Lungs: Clear to auscultation, nonlabored breathing at rest. Cardiac: Regular rate and rhythm, no S3 or significant systolic murmur, no pericardial rub. Extremities: No pitting edema, distal pulses 2+. Skin: Warm and dry. Musculoskeletal: No kyphosis. Neuropsychiatric: Alert and oriented x3, affect grossly appropriate.  ECG:  An ECG dated 12/22/2019. was personally reviewed today and demonstrated:  Sinus rhythm rate of 85, minimal voltage criteria for LVH, lateral infarct, age undetermined inferior infarct age undetermined.  Recent Labwork: No results found for requested labs within last 8760 hours.     Component Value Date/Time   CHOL 137 11/01/2009 1152   TRIG 166.0 (H) 11/01/2009 1152   HDL 35.60 (L) 11/01/2009 1152   CHOLHDL 4 11/01/2009 1152   VLDL 33.2 11/01/2009 1152   LDLCALC 68 11/01/2009 1152    Other Studies Reviewed Today:   Echocardiogram 05/01/2013: Study Conclusions  - Left ventricle: The cavity size was at the upper limits of normal. There was mild concentric hypertrophy. Systolic function was normal. The estimated ejection fraction was in the range of 55% to 60%. There is hypokinesis of the basalinferolateral and inferoseptal myocardium. Features are consistent with a pseudonormal left ventricular filling pattern, with concomitant abnormal relaxation and increased filling pressure (grade 2 diastolic dysfunction). - Aortic valve: Probably trileaflet; mildly calcified leaflets. Trivial regurgitation. Mean gradient: 88mm Hg (S). Valve area: 3.57cm^2(VTI). - Aortic root: Descending aorta with increased gradient 16 mmHg - not welll seen, but question of mild narrowing/coartaction discussed previously. Could be further assessed by CTA in  clinically significant. - Mitral valve: Trivial regurgitation. - Left atrium: The atrium was mildly dilated. - Right ventricle: The cavity size was moderately dilated. Systolic function was mildly reduced. - Right atrium: The atrium was moderately dilated. Central venous pressure: 4mm Hg (est). - Tricuspid valve: Trivial regurgitation. - Pulmonary arteries: PA peak pressure: 100mm Hg (S). - Pericardium, extracardiac: There was no pericardial effusion.  Impressions:  - Comparison to prior study March 2011. Mild LVH with upper normal chamber size and LVEF 55-60%, mild hypokinesis of the basal inferolateral and inferoseptal wall. Grade 2 diastolic dysfunction. Mild left atrial and moderate right atrial enlargement. Mildly calcified aortic valve with trivial regurgitation seen intermittently. Moderate RV enlargement with mildly reduced contraction. Trivial tricuspid regurgitation with PASP 33 mmHg.  Assessment and Plan:  1. CAD in native artery   2. Mixed hyperlipidemia   3. Essential hypertension     1. CAD in native artery He denies any progressive anginal or exertional symptoms.  Continue aspirin 81 mg, nitroglycerin 0.4 mg sublingual as needed.  2. Mixed hyperlipidemia Patient states he has had some more recent lab work since March of last year with his primary care provider.  We will attempt to obtain those results.  Continue Crestor 10 mg daily  3.  Hypertension Blood pressure 158/72 on arrival today.  Patient states his blood pressure up probably because he is here.  He does not check his blood pressures at home.  Continue lisinopril 10 mg daily and torsemide 20 mg p.o. twice daily.  Medication Adjustments/Labs and Tests Ordered: Current medicines are reviewed at length with the patient today.  Concerns regarding medicines are outlined above.   Disposition: Follow-up with Dr. Diona Browner or APP 6 months Signed, Rennis Harding, NP 12/22/2019 9:38 AM     [facility] at [facility] [address] [place], [place], [place] [address] Phone: [phone]; Fax: [phone]

## 2019-12-22 ENCOUNTER — Ambulatory Visit (INDEPENDENT_AMBULATORY_CARE_PROVIDER_SITE_OTHER): Payer: Medicaid Other | Admitting: Family Medicine

## 2019-12-22 ENCOUNTER — Encounter: Payer: Self-pay | Admitting: Family Medicine

## 2019-12-22 ENCOUNTER — Encounter: Payer: Self-pay | Admitting: *Deleted

## 2019-12-22 ENCOUNTER — Other Ambulatory Visit: Payer: Self-pay

## 2019-12-22 VITALS — BP 158/72 | HR 88 | Ht 69.0 in | Wt 303.0 lb

## 2019-12-22 DIAGNOSIS — E782 Mixed hyperlipidemia: Secondary | ICD-10-CM

## 2019-12-22 DIAGNOSIS — I10 Essential (primary) hypertension: Secondary | ICD-10-CM | POA: Diagnosis not present

## 2019-12-22 DIAGNOSIS — I2510 Atherosclerotic heart disease of native coronary artery without angina pectoris: Secondary | ICD-10-CM

## 2019-12-22 NOTE — Patient Instructions (Signed)

## 2020-02-19 ENCOUNTER — Other Ambulatory Visit: Payer: Self-pay | Admitting: Cardiology

## 2020-08-04 NOTE — Progress Notes (Signed)
Cardiology Office Note  Date: 08/05/2020   ID: Cesar Cantrell, DOB [birth year], MRN [id number]  PCP:  Juliette Alcide, MD  Cardiologist:  Nona Dell, MD Electrophysiologist:  None   Chief Complaint: F/U CAD CABG 2006, HLD  History of Present Illness: Cesar Cantrell is a 54 y.o. male with a history of CAD status post CABG 2006, HLD, HTN, OSA, Obesity hypoventilation syndrome, pulmonary HTN.  Last seen by Dr. Diona Browner 06/04/2019.  He has been doing well overall and has used only a single nitroglycerin since his last assessment.  He was compliant with his medication he was still functional with his ADLs including yard work.  His CAD was asymptomatic.  He remained on Pravachol for his hyperlipidemia.  At last visit patient stated he had been doing well since prior visit visit.  He denied any recent acute illnesses, hospitalizations.  Stated he had not had the Covid vaccine and did not plan to receive it.  He denied any progressive anginal or exertional symptoms, palpitations or arrhythmias, CVA or TIA-like symptoms, PND or orthopnea.  He was not compliant with his BiPAP therapy.  He denies any claudication-like symptoms, DVT or PE-like symptoms, or lower extremity edema.  He has gained weight.  He weighed 303 compared to previous weight at last visit of 286.  Patient states he has been doing well since the last visit 6 months ago.  He denies any recent acute illnesses or hospitalizations.  States he finally decided to have the Covid vaccine and recently had the first injection.  Plans to have the second injection soon.  He denies any anginal or exertional symptoms, palpitations or arrhythmias, orthostatic symptoms, CVA or TIA-like symptoms, PND, orthopnea, bleeding issues, claudication-like symptoms, DVT or PE-like symptoms, or lower extremity edema.  States his blood sugar has been well controlled however his A1c on lab work from PCP in April was 7.5%.  He has been compliant with all of  his medications.  Past Medical History:  Diagnosis Date  . Cor pulmonale (HCC)   . Coronary atherosclerosis of native coronary artery    Multivessel status post CABG  . Essential hypertension, benign   . Obesity hypoventilation syndrome (HCC)   . Pneumonia   . Pulmonary hypertension (HCC)    PASP 80 mmHg at [facility] 01/2013 (down to 33 mmHg by subsequent echo 2014)  . Sleep apnea    On BIPAP  . Type 2 diabetes mellitus (HCC)     Past Surgical History:  Procedure Laterality Date  . CORONARY ARTERY BYPASS GRAFT  09-20-04   LIMA to LAD, left radial to D1, SVG to ramus and SVG to PDA/PLA  . RIGHT HEART CATHETERIZATION N/A 01/20/2013   Procedure: RIGHT HEART CATH;  Surgeon: Wendall Stade, MD;  Location: [facility] CATH LAB;  Service: Cardiovascular;  Laterality: N/A;    Current Outpatient Medications  Medication Sig Dispense Refill  . aspirin EC 81 MG tablet Take 81 mg by mouth every morning.    Marland Kitchen glimepiride (AMARYL) 4 MG tablet Take 4 mg by mouth 2 (two) times daily with a meal.    . lisinopril (ZESTRIL) 10 MG tablet Take 10 mg by mouth daily.    . metFORMIN (GLUCOPHAGE) 1000 MG tablet Take 1 tablet by mouth 2 (two) times daily.    . nitroGLYCERIN (NITROSTAT) 0.4 MG SL tablet Place 1 tablet (0.4 mg total) under the tongue every 5 (five) minutes as needed for chest pain. 25 tablet 3  . Omega-3 Fatty  Acids (FISH OIL) 1200 MG CAPS Take 1 capsule by mouth daily.     . rosuvastatin (CRESTOR) 10 MG tablet TAKE ONE TABLET BY MOUTH DAILY 90 tablet 3  . torsemide (DEMADEX) 20 MG tablet TAKE ONE TABLET BY MOUTH TWICE DAILY 60 tablet 2  . TOUJEO SOLOSTAR 300 UNIT/ML SOPN Inject 30 Units into the skin 2 (two) times daily.  6   No current facility-administered medications for this visit.   Allergies:  Patient has no known allergies.   Social History: The patient  reports that he quit smoking about 7 years ago. His smoking use included cigarettes. He started smoking about 38 years ago. He has a 45.00  pack-year smoking history. His smokeless tobacco use includes chew. He reports that he does not drink alcohol and does not use drugs.  4-96 Family History: The patient's family history includes Coronary artery disease in an other family member; Heart disease (age of onset: 19) in an other family member.   ROS:  Please see the history of present illness. Otherwise, complete review of systems is positive for none.  All other systems are reviewed and negative.   Physical Exam: VS:  BP (!) 116/54   Pulse 88   Resp 16   Ht 5\' 9"  (1.753 m)   Wt (!) 300 lb 6.4 oz (136.3 kg)   SpO2 97%   BMI 44.36 kg/m , BMI Body mass index is 44.36 kg/m.  Wt Readings from Last 3 Encounters:  08/05/20 (!) 300 lb 6.4 oz (136.3 kg)  12/22/19 (!) 303 lb (137.4 kg)  06/04/19 286 lb (129.7 kg)    General: Morbidly obese patient appears comfortable at rest. Neck: Supple, no elevated JVP or carotid bruits, no thyromegaly. Lungs: Clear to auscultation, nonlabored breathing at rest. Cardiac: Regular rate and rhythm, no S3 or significant systolic murmur, no pericardial rub. Extremities: No pitting edema, distal pulses 2+. Skin: Warm and dry. Musculoskeletal: No kyphosis. Neuropsychiatric: Alert and oriented x3, affect grossly appropriate.  ECG:  An ECG dated 12/22/2019. was personally reviewed today and demonstrated:  Sinus rhythm rate of 85, minimal voltage criteria for LVH, lateral infarct, age undetermined inferior infarct age undetermined.  Recent Labwork: No results found for requested labs within last 8760 hours.     Component Value Date/Time   CHOL 137 11/01/2009 1152   TRIG 166.0 (H) 11/01/2009 1152   HDL 35.60 (L) 11/01/2009 1152   CHOLHDL 4 11/01/2009 1152   VLDL 33.2 11/01/2009 1152   LDLCALC 68 11/01/2009 1152    Other Studies Reviewed Today:   Echocardiogram 05/01/2013: Study Conclusions  - Left ventricle: The cavity size was at the upper limits of normal. There was mild concentric  hypertrophy. Systolic function was normal. The estimated ejection fraction was in the range of 55% to 60%. There is hypokinesis of the basalinferolateral and inferoseptal myocardium. Features are consistent with a pseudonormal left ventricular filling pattern, with concomitant abnormal relaxation and increased filling pressure (grade 2 diastolic dysfunction). - Aortic valve: Probably trileaflet; mildly calcified leaflets. Trivial regurgitation. Mean gradient: 37mm Hg (S). Valve area: 3.57cm^2(VTI). - Aortic root: Descending aorta with increased gradient 16 mmHg - not welll seen, but question of mild narrowing/coartaction discussed previously. Could be further assessed by CTA in clinically significant. - Mitral valve: Trivial regurgitation. - Left atrium: The atrium was mildly dilated. - Right ventricle: The cavity size was moderately dilated. Systolic function was mildly reduced. - Right atrium: The atrium was moderately dilated. Central venous pressure: 59mm Hg (est). -  Tricuspid valve: Trivial regurgitation. - Pulmonary arteries: PA peak pressure: 5mm Hg (S). - Pericardium, extracardiac: There was no pericardial effusion.  Impressions:  - Comparison to prior study March 2011. Mild LVH with upper normal chamber size and LVEF 55-60%, mild hypokinesis of the basal inferolateral and inferoseptal wall. Grade 2 diastolic dysfunction. Mild left atrial and moderate right atrial enlargement. Mildly calcified aortic valve with trivial regurgitation seen intermittently. Moderate RV enlargement with mildly reduced contraction. Trivial tricuspid regurgitation with PASP 33 mmHg.  Assessment and Plan:    1. CAD in native artery He denies any progressive anginal or exertional symptoms.  Continue aspirin 81 mg, nitroglycerin 0.4 mg sublingual as needed.  2. Mixed hyperlipidemia Patient at last visit he had some more recent lab work since March  of last year with his primary care provider.  We requested current lab work and only received a single hemoglobin A1c result.  Continue Crestor 10 mg daily.  3.  Hypertension Blood pressure well controlled today with blood pressure of 116/54.  Continue lisinopril 10 mg daily and torsemide 20 mg p.o. twice daily.  4.  Morbid obesity  Discussed weight loss and lifestyle changes as well as dietary changes to help with increased weight.  Medication Adjustments/Labs and Tests Ordered: Current medicines are reviewed at length with the patient today.  Concerns regarding medicines are outlined above.   Disposition: Follow-up with Dr. Diona Browner or APP 6 months Signed, Rennis Harding, NP 08/05/2020 9:47 AM    [facility] at [facility] [address] [place], [place], [place] [address] Phone: [phone]; Fax: [phone]

## 2020-08-05 ENCOUNTER — Encounter: Payer: Self-pay | Admitting: Family Medicine

## 2020-08-05 ENCOUNTER — Ambulatory Visit (INDEPENDENT_AMBULATORY_CARE_PROVIDER_SITE_OTHER): Payer: Medicaid Other | Admitting: Family Medicine

## 2020-08-05 ENCOUNTER — Other Ambulatory Visit: Payer: Self-pay

## 2020-08-05 VITALS — BP 116/54 | HR 88 | Resp 16 | Ht 69.0 in | Wt 300.4 lb

## 2020-08-05 DIAGNOSIS — I2510 Atherosclerotic heart disease of native coronary artery without angina pectoris: Secondary | ICD-10-CM

## 2020-08-05 DIAGNOSIS — E782 Mixed hyperlipidemia: Secondary | ICD-10-CM | POA: Diagnosis not present

## 2020-08-05 DIAGNOSIS — I10 Essential (primary) hypertension: Secondary | ICD-10-CM | POA: Diagnosis not present

## 2020-08-05 NOTE — Patient Instructions (Signed)
Medication Instructions:  Your physician recommends that you continue on your current medications as directed. Please refer to the Current Medication list given to you today.  *If you need a refill on your cardiac medications before your next appointment, please call your pharmacy*   Lab Work: None ordered  If you have labs (blood work) drawn today and your tests are completely normal, you will receive your results only by: MyChart Message (if you have MyChart) OR A paper copy in the mail If you have any lab test that is abnormal or we need to change your treatment, we will call you to review the results.   Testing/Procedures: None ordered   Follow-Up: At [facility], you and your health needs are our priority.  As part of our continuing mission to provide you with exceptional heart care, we have created designated Provider Care Teams.  These Care Teams include your primary Cardiologist (physician) and Advanced Practice Providers (APPs -  Physician Assistants and Nurse Practitioners) who all work together to provide you with the care you need, when you need it.  We recommend signing up for the patient portal called "MyChart".  Sign up information is provided on this After Visit Summary.  MyChart is used to connect with patients for Virtual Visits (Telemedicine).  Patients are able to view lab/test results, encounter notes, upcoming appointments, etc.  Non-urgent messages can be sent to your provider as well.   To learn more about what you can do with MyChart, go to [email or web address].    Your next appointment:   6 month(s)  The format for your next appointment:   In Person  Provider:   You may see Samuel McDowell, MD or the following Advanced Practice Provider on your designated Care Team:   Andy Quinn, NP   Other Instructions   

## 2021-12-20 NOTE — Progress Notes (Signed)
? ? ?Cardiology Office Note ? ?Date: 12/21/2021  ? ?ID: BOWEN KIA, DOB 1966/02/11, MRN [id number] ? ?PCP:  Juliette Alcide, MD  ?Cardiologist:  Nona Dell, MD ?Electrophysiologist:  None  ? ?Chief Complaint  ?Patient presents with  ? Cardiac follow-up  ? ? ?History of Present Illness: ?Cesar Cantrell is a 56 y.o. male last seen in December 2021 by Mr. Vincenza Hews NP.  He is here for a follow-up visit.  He continues to work in Aeronautical engineer.  Does not report any angina or nitroglycerin use, NYHA class II dyspnea. ? ?He reports having lab work done with Dr. Leandrew Koyanagi within the last 6 months, we are requesting the results for review.  I went over his cardiac medications.  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 ? 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 ? ?Past Medical History:  ?Diagnosis Date  ? Cor pulmonale (HCC)   ? Coronary atherosclerosis of native coronary artery   ? Multivessel status post CABG  ? Essential hypertension, benign   ? Obesity hypoventilation syndrome (HCC)   ? Pneumonia   ? Pulmonary hypertension (HCC)   ? PASP 80 mmHg at [facility] 01/2013 (down to 33 mmHg by subsequent echo 2014)  ? Sleep apnea   ? On BIPAP  ? Type 2 diabetes mellitus (HCC)   ? ? ?Past Surgical History:  ?Procedure Laterality Date  ? CORONARY ARTERY BYPASS GRAFT  09-20-04  ? LIMA to LAD, left radial to D1, SVG to ramus and SVG to PDA/PLA  ? RIGHT HEART CATHETERIZATION N/A 01/20/2013  ? Procedure: RIGHT HEART CATH;  Surgeon: Wendall Stade, MD;  Location: [facility] CATH LAB;  Service: Cardiovascular;  Laterality: N/A;  ? ? ?Current Outpatient Medications  ?Medication Sig Dispense Refill  ? aspirin EC 81 MG tablet Take 81 mg by mouth every morning.    ? glimepiride (AMARYL) 4 MG tablet Take 4 mg by mouth 2 (two) times daily with a meal.    ? lisinopril (ZESTRIL) 10 MG tablet Take 10 mg by mouth daily.    ?  metFORMIN (GLUCOPHAGE) 1000 MG tablet Take 1 tablet by mouth 2 (two) times daily.    ? nitroGLYCERIN (NITROSTAT) 0.4 MG SL tablet Place 1 tablet (0.4 mg total) under the tongue every 5 (five) minutes as needed for chest pain. 25 tablet 3  ? Omega-3 Fatty Acids (FISH OIL) 1200 MG CAPS Take 1 capsule by mouth daily.     ? rosuvastatin (CRESTOR) 10 MG tablet TAKE ONE TABLET BY MOUTH DAILY 90 tablet 3  ? torsemide (DEMADEX) 20 MG tablet TAKE ONE TABLET BY MOUTH TWICE DAILY 60 tablet 2  ? TOUJEO SOLOSTAR 300 UNIT/ML SOPN Inject 30 Units into the skin 2 (two) times daily.  6  ? ?No current facility-administered medications for this visit.  ? ?Allergies:  Patient has no known allergies.  ? ?ROS: No palpitations or syncope. ? ?Physical Exam: ?VS:  BP 132/80   Pulse 86   Ht 5\' 10"  (1.778 m)   Wt 298 lb 3.2 oz (135.3 kg)   SpO2 95%   BMI 42.79 kg/m? , BMI Body mass index is 42.79 kg/m?. ? ?Wt Readings from Last 3 Encounters:  ?12/21/21 298 lb 3.2 oz (135.3 kg)  ?08/05/20 (!) 300 lb 6.4 oz (136.3 kg)  ?12/22/19 (!) 303 lb (137.4 kg)  ?  ?General: Patient appears comfortable at  rest. ?HEENT: Conjunctiva and lids normal. ?Neck: Supple, no elevated JVP or carotid bruits, no thyromegaly. ?Lungs: Clear to auscultation, nonlabored breathing at rest. ?Cardiac: Regular rate and rhythm, no S3 or significant systolic murmur, no pericardial rub. ?Abdomen: Soft, bowel sounds present. ?Extremities: No pitting edema. ? ?ECG:  An ECG dated 12/22/2019 was personally reviewed today and demonstrated:  Sinus rhythm with increased voltage and nonspecific ST changes. ? ?Recent Labwork: ? ?April 2021: Hemoglobin A1c 7.5% ? ?Other Studies Reviewed Today: ? ?Echocardiogram 05/01/2013: ?Study Conclusions ? ?- Left ventricle: The cavity size was at the upper limits of ?  normal. There was mild concentric hypertrophy. Systolic ?  function was normal. The estimated ejection fraction was ?  in the range of 55% to 60%. There is hypokinesis of the ?   basalinferolateral and inferoseptal myocardium. Features ?  are consistent with a pseudonormal left ventricular ?  filling pattern, with concomitant abnormal relaxation and ?  increased filling pressure (grade 2 diastolic ?  dysfunction). ?- Aortic valve: Probably trileaflet; mildly calcified ?  leaflets. Trivial regurgitation. Mean gradient: 65mm Hg ?  (S). Valve area: 3.57cm^2(VTI). ?- Aortic root: Descending aorta with increased gradient 16 ?  mmHg - not welll seen, but question of mild ?  narrowing/coartaction discussed previously. Could be ?  further assessed by CTA in clinically significant. ?- Mitral valve: Trivial regurgitation. ?- Left atrium: The atrium was mildly dilated. ?- Right ventricle: The cavity size was moderately dilated. ?  Systolic function was mildly reduced. ?- Right atrium: The atrium was moderately dilated. Central ?  venous pressure: 45mm Hg (est). ?- Tricuspid valve: Trivial regurgitation. ?- Pulmonary arteries: PA peak pressure: 53mm Hg (S). ?- Pericardium, extracardiac: There was no pericardial ?  effusion. ? ?Impressions: ? ?- Comparison to prior study March 2011. Mild LVH with upper ?  normal chamber size and LVEF 55-60%, mild hypokinesis of ?  the basal inferolateral and inferoseptal wall. Grade 2 ?  diastolic dysfunction. Mild left atrial and moderate right ?  atrial enlargement. Mildly calcified aortic valve with ?  trivial regurgitation seen intermittently. Moderate RV ?  enlargement with mildly reduced contraction. Trivial ?  tricuspid regurgitation with PASP 33 mmHg. ? ?Assessment and Plan: ? ?1.  CAD status post CABG in 2006.  Reports no change in stamina, no angina symptoms or nitroglycerin use.  ECG reviewed.  Continue aspirin, lisinopril, and Crestor.  He has as needed nitroglycerin available.  Requesting interval lab work from PCP for review. ? ?2.  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  Has had some element of cor pulmonale and is on  Demadex.  We will plan a repeat echocardiogram for surveillance. ? ?3.  Type 2 diabetes mellitus, currently on Amaryl and Toujeo Solostar with follow-up by Dr. Leandrew Koyanagi.  Could be a candidate for SGLT2 inhibitor as well. ? ?Medication Adjustments/Labs and Tests Ordered: ?Current medicines are reviewed at length with the patient today.  Concerns regarding medicines are outlined above.  ? ?Tests Ordered: ?Orders Placed This Encounter  ?Procedures  ? EKG 12-Lead  ? ECHOCARDIOGRAM COMPLETE  ? ? ?Medication Changes: ?No orders of the defined types were placed in this encounter. ? ? ?Disposition:  Follow up  1 year. ? ?Signed, ?Jonelle Sidle, MD, [facility] ?12/21/2021 9:16 AM    ?[facility] at [facility] ?[address] [place], [place], [place] [address] ?Phone: [phone]; Fax: [phone]  ?

## 2021-12-21 ENCOUNTER — Encounter: Payer: Self-pay | Admitting: *Deleted

## 2021-12-21 ENCOUNTER — Ambulatory Visit (INDEPENDENT_AMBULATORY_CARE_PROVIDER_SITE_OTHER): Payer: Medicaid Other | Admitting: Cardiology

## 2021-12-21 ENCOUNTER — Encounter: Payer: Self-pay | Admitting: Cardiology

## 2021-12-21 VITALS — BP 132/80 | HR 86 | Ht 70.0 in | Wt 298.2 lb

## 2021-12-21 DIAGNOSIS — I25119 Atherosclerotic heart disease of native coronary artery with unspecified angina pectoris: Secondary | ICD-10-CM | POA: Diagnosis not present

## 2021-12-21 DIAGNOSIS — E782 Mixed hyperlipidemia: Secondary | ICD-10-CM | POA: Diagnosis not present

## 2021-12-21 DIAGNOSIS — G4733 Obstructive sleep apnea (adult) (pediatric): Secondary | ICD-10-CM | POA: Diagnosis not present

## 2021-12-21 NOTE — Patient Instructions (Addendum)
Medication Instructions:  Your physician recommends that you continue on your current medications as directed. Please refer to the Current Medication list given to you today.  Labwork: none  Testing/Procedures: Your physician has requested that you have an echocardiogram. Echocardiography is a painless test that uses sound waves to create images of your heart. It provides your doctor with information about the size and shape of your heart and how well your heart's chambers and valves are working. This procedure takes approximately one hour. There are no restrictions for this procedure.  Follow-Up: Your physician recommends that you schedule a follow-up appointment in: 1 year. You will receive a reminder call in the mail in about 10 months reminding you to call and schedule your appointment. If you don't receive this call, please contact our office.  Any Other Special Instructions Will Be Listed Below (If Applicable).  If you need a refill on your cardiac medications before your next appointment, please call your pharmacy. 

## 2022-01-04 ENCOUNTER — Other Ambulatory Visit: Payer: Medicaid Other

## 2022-01-09 ENCOUNTER — Ambulatory Visit (INDEPENDENT_AMBULATORY_CARE_PROVIDER_SITE_OTHER): Payer: Medicaid Other

## 2022-01-09 DIAGNOSIS — I25119 Atherosclerotic heart disease of native coronary artery with unspecified angina pectoris: Secondary | ICD-10-CM | POA: Diagnosis not present

## 2022-01-09 LAB — ECHOCARDIOGRAM COMPLETE
AR max vel: 1.66 cm2
AV Area VTI: 1.67 cm2
AV Area mean vel: 1.65 cm2
AV Mean grad: 6.1 mmHg
AV Peak grad: 10.5 mmHg
Ao pk vel: 1.62 m/s
Area-P 1/2: 3.93 cm2
Calc EF: 47.7 %
S' Lateral: 4.91 cm
Single Plane A2C EF: 52.3 %
Single Plane A4C EF: 41.6 %

## 2023-03-22 ENCOUNTER — Ambulatory Visit: Payer: Medicaid Other | Attending: Cardiology | Admitting: Cardiology

## 2023-03-22 ENCOUNTER — Encounter: Payer: Self-pay | Admitting: Cardiology

## 2023-03-22 VITALS — BP 138/88 | HR 78 | Ht 71.0 in | Wt 288.2 lb

## 2023-03-22 DIAGNOSIS — I272 Pulmonary hypertension, unspecified: Secondary | ICD-10-CM

## 2023-03-22 DIAGNOSIS — I25119 Atherosclerotic heart disease of native coronary artery with unspecified angina pectoris: Secondary | ICD-10-CM

## 2023-03-22 DIAGNOSIS — E782 Mixed hyperlipidemia: Secondary | ICD-10-CM

## 2023-03-22 DIAGNOSIS — I10 Essential (primary) hypertension: Secondary | ICD-10-CM | POA: Diagnosis not present

## 2023-03-22 NOTE — Progress Notes (Signed)
Cardiology Office Note  Date: 03/22/2023   ID: Cesar Cantrell, DOB 1966/02/17, MRN [id number]  History of Present Illness: Cesar Cantrell is a 57 y.o. male last seen in May 2023.  He is here for a routine visit.  Reports no angina or nitroglycerin use in the interim, stable NYHA class II dyspnea.  No palpitations or syncope.  He has been busy raising a 56-year-old grandson which is starting kindergarten.  I went over his medications.  He reports compliance with therapy, no obvious intolerances.  Requesting most recent lipid panel from Dr. Leandrew Koyanagi for review.  LDL was 90 last year, likely needs up titration of Crestor.  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  We discussed getting an updated echocardiogram in comparison to study from June of last year.  Physical Exam: VS:  BP 138/88   Pulse 78   Ht 5\' 11"  (1.803 m)   Wt 288 lb 3.2 oz (130.7 kg)   SpO2 96%   BMI 40.20 kg/m , BMI Body mass index is 40.2 kg/m.  Wt Readings from Last 3 Encounters:  03/22/23 288 lb 3.2 oz (130.7 kg)  12/21/21 298 lb 3.2 oz (135.3 kg)  08/05/20 (!) 300 lb 6.4 oz (136.3 kg)    General: Patient appears comfortable at rest. HEENT: Conjunctiva and lids normal. Neck: Supple, no elevated JVP or carotid bruits. Lungs: Clear to auscultation, nonlabored breathing at rest. Cardiac: Regular rate and rhythm, no S3 or significant systolic murmur. Extremities: No pitting edema.  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  Labwork:  May 2023: BUN 12, creatinine 0.92, potassium 4.5, AST 14, ALT 17, cholesterol 157, triglycerides 163, HDL 39, LDL 90, hemoglobin A1c 7.5%  Other Studies Reviewed Today:  Echocardiogram 01/09/2022:  1. Left ventricular ejection fraction, by estimation, is 60 to 65%. The  left ventricle has normal function. Left ventricular endocardial border  not  optimally defined to evaluate regional wall motion. Left ventricular  diastolic parameters were normal.  The average left ventricular global longitudinal strain is -18.6 %. The  global longitudinal strain is normal.   2. Right ventricular systolic function was not well visualized. The right  ventricular size is not well visualized. Tricuspid regurgitation signal is  inadequate for assessing PA pressure.   3. The mitral valve is normal in structure. No evidence of mitral valve  regurgitation. No evidence of mitral stenosis.   4. The aortic valve is tricuspid. There is mild calcification of the  aortic valve. There is mild thickening of the aortic valve. Aortic valve  regurgitation is not visualized. No aortic stenosis is present.   5. The inferior vena cava is normal in size with greater than 50%  respiratory variability, suggesting right atrial pressure of 3 mmHg.   Assessment and Plan:  1.  Multivessel CAD status post CABG in 2006 with LIMA to LAD, free radial to first diagonal, SVG to ramus intermedius, and SVG to PDA/PLA.  LVEF 60 to 65% by echocardiogram in June 2023.  He reports no active angina or nitroglycerin use at this time.  ECG reviewed and stable.  Continue aspirin, Cozaar, Crestor, and as needed nitroglycerin.  2.  Essential hypertension.  Systolic in the 130s today, no changes made to present regimen.  3.  History of pulmonary hypertension, likely WHO group 2/3.  Echocardiogram in June of last year was not able  to adequately visualize RV or estimate RVSP.  Plan to update study for this year.  4.  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  Currently on Crestor 10 mg daily.  Requesting most recent lipid panel from PCP.  Disposition:  Follow up  1 year.  Signed, Jonelle Sidle, M.D., F.A.C.C. [facility] at [facility]

## 2023-03-22 NOTE — Patient Instructions (Signed)
Medication Instructions:  Your physician recommends that you continue on your current medications as directed. Please refer to the Current Medication list given to you today.   Labwork: None today  Testing/Procedures: Your physician has requested that you have an echocardiogram. Echocardiography is a painless test that uses sound waves to create images of your heart. It provides your doctor with information about the size and shape of your heart and how well your heart's chambers and valves are working. This procedure takes approximately one hour. There are no restrictions for this procedure. Please do NOT wear cologne, perfume, aftershave, or lotions (deodorant is allowed). Please arrive 15 minutes prior to your appointment time.   Follow-Up: 1 year  Any Other Special Instructions Will Be Listed Below (If Applicable).  If you need a refill on your cardiac medications before your next appointment, please call your pharmacy.

## 2023-03-29 ENCOUNTER — Encounter: Payer: Self-pay | Admitting: Cardiology

## 2023-04-10 ENCOUNTER — Ambulatory Visit: Payer: Medicaid Other

## 2023-04-24 ENCOUNTER — Telehealth: Payer: Self-pay | Admitting: Cardiology

## 2023-04-24 NOTE — Telephone Encounter (Signed)
Patient was scheduled for an Echo . 03/28/2023 [facility] [id number], [id number], [id number] DENIAL CASE# [id number] DENIED DUE TO DR AND FACILITY BEING OUT OF NETWORK FOR [facility]. Mr. Kellas was notified. States that he will transfer his care to a facility in [place].

## 2023-05-25 ENCOUNTER — Telehealth: Payer: Self-pay | Admitting: Cardiology

## 2023-05-25 NOTE — Telephone Encounter (Signed)
Patient mother stated they have to change cardiologist to [place] per patient medicaid advised patient's mother to contact patient PCP for referral

## 2023-05-25 NOTE — Telephone Encounter (Signed)
Patient's mother states patient was given a list of cardiologists in [place]. They contacted Dr. [place]. Catha Gosselin and their office needs a referral. She provided a phone number for their office, [phone].
